# Patient Record
Sex: Female | Born: 1945 | Race: White | Hispanic: No | Marital: Married | Smoking: Never smoker
Health system: Southern US, Community
[De-identification: ages and names within clinical notes are randomized; demographics above are authoritative.]

## PROBLEM LIST (undated history)

## (undated) DIAGNOSIS — K5909 Other constipation: Secondary | ICD-10-CM

## (undated) DIAGNOSIS — N6099 Unspecified benign mammary dysplasia of unspecified breast: Secondary | ICD-10-CM

## (undated) DIAGNOSIS — I639 Cerebral infarction, unspecified: Secondary | ICD-10-CM

## (undated) DIAGNOSIS — K911 Postgastric surgery syndromes: Secondary | ICD-10-CM

## (undated) DIAGNOSIS — I509 Heart failure, unspecified: Secondary | ICD-10-CM

## (undated) DIAGNOSIS — K635 Polyp of colon: Secondary | ICD-10-CM

## (undated) DIAGNOSIS — R0600 Dyspnea, unspecified: Secondary | ICD-10-CM

## (undated) DIAGNOSIS — I499 Cardiac arrhythmia, unspecified: Secondary | ICD-10-CM

## (undated) DIAGNOSIS — C50919 Malignant neoplasm of unspecified site of unspecified female breast: Secondary | ICD-10-CM

## (undated) DIAGNOSIS — I6381 Other cerebral infarction due to occlusion or stenosis of small artery: Secondary | ICD-10-CM

## (undated) DIAGNOSIS — Z923 Personal history of irradiation: Secondary | ICD-10-CM

## (undated) DIAGNOSIS — E785 Hyperlipidemia, unspecified: Secondary | ICD-10-CM

## (undated) DIAGNOSIS — R928 Other abnormal and inconclusive findings on diagnostic imaging of breast: Secondary | ICD-10-CM

## (undated) DIAGNOSIS — K5900 Constipation, unspecified: Secondary | ICD-10-CM

## (undated) DIAGNOSIS — R197 Diarrhea, unspecified: Secondary | ICD-10-CM

## (undated) DIAGNOSIS — M270 Developmental disorders of jaws: Secondary | ICD-10-CM

## (undated) DIAGNOSIS — U071 COVID-19: Secondary | ICD-10-CM

## (undated) DIAGNOSIS — E669 Obesity, unspecified: Secondary | ICD-10-CM

## (undated) DIAGNOSIS — I10 Essential (primary) hypertension: Secondary | ICD-10-CM

## (undated) DIAGNOSIS — M1990 Unspecified osteoarthritis, unspecified site: Secondary | ICD-10-CM

## (undated) DIAGNOSIS — K589 Irritable bowel syndrome without diarrhea: Secondary | ICD-10-CM

## (undated) HISTORY — PX: CHOLECYSTECTOMY: SHX55

## (undated) HISTORY — PX: OTHER SURGICAL HISTORY: SHX169

## (undated) HISTORY — PX: BREAST LUMPECTOMY: SHX2

## (undated) HISTORY — PX: COLONOSCOPY: SHX174

## (undated) HISTORY — DX: Heart failure, unspecified: I50.9

## (undated) HISTORY — DX: Cerebral infarction, unspecified: I63.9

## (undated) SURGERY — Surgical Case
Anesthesia: *Unknown

---

## 1998-10-09 DIAGNOSIS — C50919 Malignant neoplasm of unspecified site of unspecified female breast: Secondary | ICD-10-CM

## 1998-10-09 HISTORY — PX: BREAST LUMPECTOMY: SHX2

## 1998-10-09 HISTORY — PX: BREAST BIOPSY: SHX20

## 1998-10-09 HISTORY — DX: Malignant neoplasm of unspecified site of unspecified female breast: C50.919

## 2004-06-10 ENCOUNTER — Other Ambulatory Visit: Payer: Self-pay

## 2005-01-19 ENCOUNTER — Ambulatory Visit: Payer: Self-pay | Admitting: Oncology

## 2005-05-08 ENCOUNTER — Ambulatory Visit: Payer: Self-pay | Admitting: Unknown Physician Specialty

## 2005-06-20 ENCOUNTER — Ambulatory Visit: Payer: Self-pay | Admitting: Gastroenterology

## 2006-01-18 ENCOUNTER — Ambulatory Visit: Payer: Self-pay | Admitting: Oncology

## 2006-05-14 ENCOUNTER — Ambulatory Visit: Payer: Self-pay | Admitting: Unknown Physician Specialty

## 2007-01-02 ENCOUNTER — Ambulatory Visit: Payer: Self-pay | Admitting: Unknown Physician Specialty

## 2007-05-23 ENCOUNTER — Ambulatory Visit: Payer: Self-pay | Admitting: Unknown Physician Specialty

## 2008-07-09 ENCOUNTER — Ambulatory Visit: Payer: Self-pay | Admitting: Unknown Physician Specialty

## 2009-07-29 ENCOUNTER — Ambulatory Visit: Payer: Self-pay | Admitting: Unknown Physician Specialty

## 2010-09-07 ENCOUNTER — Ambulatory Visit: Payer: Self-pay | Admitting: Unknown Physician Specialty

## 2011-10-25 ENCOUNTER — Ambulatory Visit: Payer: Self-pay | Admitting: Unknown Physician Specialty

## 2012-11-14 ENCOUNTER — Ambulatory Visit: Payer: Self-pay | Admitting: Family Medicine

## 2013-11-18 ENCOUNTER — Ambulatory Visit: Payer: Self-pay | Admitting: Family Medicine

## 2014-07-14 DIAGNOSIS — M170 Bilateral primary osteoarthritis of knee: Secondary | ICD-10-CM | POA: Insufficient documentation

## 2014-09-10 DIAGNOSIS — M5431 Sciatica, right side: Secondary | ICD-10-CM | POA: Insufficient documentation

## 2014-09-10 DIAGNOSIS — M4316 Spondylolisthesis, lumbar region: Secondary | ICD-10-CM | POA: Insufficient documentation

## 2014-11-19 ENCOUNTER — Ambulatory Visit: Payer: Self-pay | Admitting: Family Medicine

## 2016-02-21 ENCOUNTER — Other Ambulatory Visit: Payer: Self-pay | Admitting: Family Medicine

## 2016-02-21 DIAGNOSIS — Z1231 Encounter for screening mammogram for malignant neoplasm of breast: Secondary | ICD-10-CM

## 2016-03-02 ENCOUNTER — Other Ambulatory Visit: Payer: Self-pay | Admitting: Family Medicine

## 2016-03-02 ENCOUNTER — Ambulatory Visit
Admission: RE | Admit: 2016-03-02 | Discharge: 2016-03-02 | Disposition: A | Discharge status: Home / Self Care | Payer: Medicare Other | Source: Ambulatory Visit | Attending: Family Medicine | Admitting: Family Medicine

## 2016-03-02 DIAGNOSIS — Z1231 Encounter for screening mammogram for malignant neoplasm of breast: Secondary | ICD-10-CM

## 2016-03-02 HISTORY — DX: Malignant neoplasm of unspecified site of unspecified female breast: C50.919

## 2016-07-03 ENCOUNTER — Encounter: Payer: Self-pay | Admitting: *Deleted

## 2016-07-04 ENCOUNTER — Encounter: Payer: Self-pay | Admitting: *Deleted

## 2016-07-04 ENCOUNTER — Encounter
Admission: RE | Disposition: A | Discharge status: Home / Self Care | Payer: Self-pay | Source: Ambulatory Visit | Attending: Gastroenterology

## 2016-07-04 ENCOUNTER — Ambulatory Visit: Payer: Medicare Other | Admitting: Certified Registered Nurse Anesthetist

## 2016-07-04 ENCOUNTER — Ambulatory Visit
Admission: RE | Admit: 2016-07-04 | Discharge: 2016-07-04 | Disposition: A | Discharge status: Home / Self Care | Payer: Medicare Other | Source: Ambulatory Visit | Attending: Gastroenterology | Admitting: Gastroenterology

## 2016-07-04 DIAGNOSIS — K5730 Diverticulosis of large intestine without perforation or abscess without bleeding: Secondary | ICD-10-CM | POA: Insufficient documentation

## 2016-07-04 DIAGNOSIS — K641 Second degree hemorrhoids: Secondary | ICD-10-CM | POA: Insufficient documentation

## 2016-07-04 DIAGNOSIS — E669 Obesity, unspecified: Secondary | ICD-10-CM | POA: Insufficient documentation

## 2016-07-04 DIAGNOSIS — Z1211 Encounter for screening for malignant neoplasm of colon: Secondary | ICD-10-CM | POA: Insufficient documentation

## 2016-07-04 DIAGNOSIS — Z6836 Body mass index (BMI) 36.0-36.9, adult: Secondary | ICD-10-CM | POA: Insufficient documentation

## 2016-07-04 DIAGNOSIS — Z79899 Other long term (current) drug therapy: Secondary | ICD-10-CM | POA: Insufficient documentation

## 2016-07-04 DIAGNOSIS — Z800 Family history of malignant neoplasm of digestive organs: Secondary | ICD-10-CM | POA: Diagnosis not present

## 2016-07-04 DIAGNOSIS — Z791 Long term (current) use of non-steroidal anti-inflammatories (NSAID): Secondary | ICD-10-CM | POA: Diagnosis not present

## 2016-07-04 DIAGNOSIS — Z86010 Personal history of colonic polyps: Secondary | ICD-10-CM | POA: Insufficient documentation

## 2016-07-04 DIAGNOSIS — I10 Essential (primary) hypertension: Secondary | ICD-10-CM | POA: Diagnosis not present

## 2016-07-04 DIAGNOSIS — Z8673 Personal history of transient ischemic attack (TIA), and cerebral infarction without residual deficits: Secondary | ICD-10-CM | POA: Insufficient documentation

## 2016-07-04 DIAGNOSIS — Z853 Personal history of malignant neoplasm of breast: Secondary | ICD-10-CM | POA: Insufficient documentation

## 2016-07-04 DIAGNOSIS — Z7982 Long term (current) use of aspirin: Secondary | ICD-10-CM | POA: Insufficient documentation

## 2016-07-04 DIAGNOSIS — E785 Hyperlipidemia, unspecified: Secondary | ICD-10-CM | POA: Diagnosis not present

## 2016-07-04 HISTORY — DX: Postgastric surgery syndromes: K91.1

## 2016-07-04 HISTORY — DX: Other abnormal and inconclusive findings on diagnostic imaging of breast: R92.8

## 2016-07-04 HISTORY — DX: Constipation, unspecified: K59.00

## 2016-07-04 HISTORY — DX: Hyperlipidemia, unspecified: E78.5

## 2016-07-04 HISTORY — DX: Unspecified benign mammary dysplasia of unspecified breast: N60.99

## 2016-07-04 HISTORY — DX: Other constipation: K59.09

## 2016-07-04 HISTORY — DX: Cardiac arrhythmia, unspecified: I49.9

## 2016-07-04 HISTORY — DX: Other cerebral infarction due to occlusion or stenosis of small artery: I63.81

## 2016-07-04 HISTORY — DX: Diarrhea, unspecified: R19.7

## 2016-07-04 HISTORY — DX: Essential (primary) hypertension: I10

## 2016-07-04 HISTORY — DX: Developmental disorders of jaws: M27.0

## 2016-07-04 HISTORY — DX: Polyp of colon: K63.5

## 2016-07-04 HISTORY — PX: COLONOSCOPY WITH PROPOFOL: SHX5780

## 2016-07-04 HISTORY — DX: Obesity, unspecified: E66.9

## 2016-07-04 HISTORY — DX: Unspecified osteoarthritis, unspecified site: M19.90

## 2016-07-04 HISTORY — DX: Irritable bowel syndrome, unspecified: K58.9

## 2016-07-04 SURGERY — COLONOSCOPY WITH PROPOFOL
Anesthesia: General

## 2016-07-04 MED ORDER — SODIUM CHLORIDE 0.9 % IV SOLN: INTRAVENOUS | Status: DC

## 2016-07-04 MED ORDER — LIDOCAINE HCL (CARDIAC) 20 MG/ML IV SOLN
INTRAVENOUS | Status: DC | PRN
  Administered 2016-07-04: 30 mg via INTRAVENOUS

## 2016-07-04 MED ORDER — PROPOFOL 10 MG/ML IV BOLUS
INTRAVENOUS | Status: DC | PRN
Start: 2016-07-04 — End: 2016-07-04
  Administered 2016-07-04: 30 mg via INTRAVENOUS

## 2016-07-04 MED ORDER — SODIUM CHLORIDE 0.9 % IV SOLN
INTRAVENOUS | Status: DC
  Administered 2016-07-04: 1000 mL via INTRAVENOUS

## 2016-07-04 MED ORDER — PROPOFOL 500 MG/50ML IV EMUL
INTRAVENOUS | Status: DC | PRN
  Administered 2016-07-04: 120 ug/kg/min via INTRAVENOUS

## 2016-07-04 MED ORDER — MIDAZOLAM HCL 2 MG/2ML IJ SOLN
INTRAMUSCULAR | Status: DC | PRN
  Administered 2016-07-04: 1 mg via INTRAVENOUS

## 2016-07-04 NOTE — Anesthesia Preprocedure Evaluation (Addendum)
Anesthesia Evaluation  Patient identified by MRN, date of birth, ID band Patient awake    Reviewed: Allergy & Precautions, NPO status , Patient's Chart, lab work & pertinent test results, reviewed documented beta blocker date and time   Airway Mallampati: III  TM Distance: <3 FB     Dental   Pulmonary neg pulmonary ROS,    Pulmonary exam normal        Cardiovascular hypertension, Pt. on medications and Pt. on home beta blockers Normal cardiovascular exam+ dysrhythmias      Neuro/Psych L Lacunar infarct CVA, No Residual Symptoms negative psych ROS   GI/Hepatic Neg liver ROS, IBS and colon polyps   Endo/Other  negative endocrine ROS  Renal/GU negative Renal ROS  negative genitourinary   Musculoskeletal  (+) Arthritis , Osteoarthritis,    Abdominal Normal abdominal exam  (+)   Peds negative pediatric ROS (+)  Hematology negative hematology ROS (+)   Anesthesia Other Findings Hx of Left breast CA  Reproductive/Obstetrics                            Anesthesia Physical Anesthesia Plan  ASA: III  Anesthesia Plan: General   Post-op Pain Management:    Induction: Intravenous  Airway Management Planned: Nasal Cannula  Additional Equipment:   Intra-op Plan:   Post-operative Plan:   Informed Consent: I have reviewed the patients History and Physical, chart, labs and discussed the procedure including the risks, benefits and alternatives for the proposed anesthesia with the patient or authorized representative who has indicated his/her understanding and acceptance.   Dental advisory given  Plan Discussed with: CRNA and Surgeon  Anesthesia Plan Comments:         Anesthesia Quick Evaluation

## 2016-07-04 NOTE — Transfer of Care (Signed)
Immediate Anesthesia Transfer of Care Note  Patient: Kelly Howell  Procedure(s) Performed: Procedure(s): COLONOSCOPY WITH PROPOFOL (N/A)  Patient Location: PACU  Anesthesia Type:General  Level of Consciousness: sedated  Airway & Oxygen Therapy: Patient Spontanous Breathing and Patient connected to nasal cannula oxygen  Post-op Assessment: Report given to RN and Post -op Vital signs reviewed and stable  Post vital signs: Reviewed and stable  Last Vitals:  Vitals:   07/04/16 1010 07/04/16 1154  BP: (!) 151/75   Pulse: 68   Resp: 16   Temp: 37.1 C (!) 35.9 C    Last Pain:  Vitals:   07/04/16 1154  TempSrc: Tympanic         Complications: No apparent anesthesia complications

## 2016-07-04 NOTE — Op Note (Signed)
[facility] Patient Name: Kelly Howell Procedure Date: 07/04/2016 11:11 AM MRN: [id number] Account #: [id number] Date of Birth: April 17, 1946 Admit Type: Outpatient Age: 70 Room: [facility] ENDO ROOM 3 Gender: Female Note Status: Finalized Procedure:            Colonoscopy Indications:          Family history of colon cancer in a first-degree                        relative Providers:            Lollie Sails, MD Referring MD:         Irven Easterly. Kary Kos, MD (Referring MD) Medicines:            Monitored Anesthesia Care Complications:        No immediate complications. minimal mucosal irritation                        in the proximal ascending, limited to the mucosa Procedure:            Pre-Anesthesia Assessment:                       - ASA Grade Assessment: III - A patient with severe                        systemic disease.                       After obtaining informed consent, the colonoscope was                        passed under direct vision. Throughout the procedure,                        the patient's blood pressure, pulse, and oxygen                        saturations were monitored continuously. The                        Colonoscope was introduced through the anus and                        advanced to the the cecum, identified by appendiceal                        orifice and ileocecal valve. The colonoscopy was                        performed with moderate difficulty due to significant                        looping. Successful completion of the procedure was                        aided by changing the patient to a supine position,                        changing the patient to a prone position and using  manual pressure. The quality of the bowel preparation                        was good. Findings:      Multiple small and large-mouthed diverticula were found in the sigmoid       colon and descending colon.  Non-bleeding internal hemorrhoids were found during retroflexion, during       digital exam and during anoscopy. The hemorrhoids were small and Grade       II (internal hemorrhoids that prolapse but reduce spontaneously).      The exam was otherwise without abnormality.      The digital rectal exam was normal otherwise. Impression:           - Diverticulosis in the sigmoid colon and in the                        descending colon.                       - Non-bleeding internal hemorrhoids.                       - The examination was otherwise normal.                       - No specimens collected. Recommendation:       - Repeat colonoscopy in 5 years for screening purposes. Procedure Code(s):    --- Professional ---                       [phone], Colonoscopy, flexible; diagnostic, including                        collection of specimen(s) by brushing or washing, when                        performed (separate procedure) CPT copyright 2016 [employer]. All rights reserved. The codes documented in this report are preliminary and upon coder review may  be revised to meet current compliance requirements. Lollie Sails, MD 07/04/2016 11:55:48 AM This report has been signed electronically. Number of Addenda: 0 Note Initiated On: 07/04/2016 11:11 AM Scope Withdrawal Time: 0 hours 8 minutes 20 seconds  Total Procedure Duration: 0 hours 27 minutes 17 seconds       [facility]

## 2016-07-04 NOTE — H&P (Signed)
Outpatient short stay form Pre-procedure 07/04/2016 11:16 AM Lollie Sails MD  Primary Physician: Dr. Maryland Pink  Reason for visit:  Colonoscopy  History of present illness:  Patient is a 70 year old female presenting today as above. She tolerated her prep well. She does take a daily 81 mg aspirin but held that this morning. She takes no other aspirin products or blood thinning agents.    Current Facility-Administered Medications:  .  0.9 %  sodium chloride infusion, , Intravenous, Continuous, Lollie Sails, MD, Last Rate: 20 mL/hr at 07/04/16 1029, 1,000 mL at 07/04/16 1029 .  0.9 %  sodium chloride infusion, , Intravenous, Continuous, Lollie Sails, MD  Prescriptions Prior to Admission  Medication Sig Dispense Refill Last Dose  . aspirin 81 MG chewable tablet Chew by mouth daily.     Marland Kitchen atorvastatin (LIPITOR) 20 MG tablet Take 20 mg by mouth daily.     Marland Kitchen co-enzyme Q-10 30 MG capsule Take 100 mg by mouth daily.     . fexofenadine (ALLEGRA) 180 MG tablet Take 180 mg by mouth daily as needed for allergies or rhinitis.     . fluticasone (FLONASE) 50 MCG/ACT nasal spray Place 2 sprays into both nostrils daily.     . Hylan (SYNVISC) 16 MG/2ML SOSY Inject into the articular space once a week.     Marland Kitchen lisinopril (PRINIVIL,ZESTRIL) 20 MG tablet Take 20 mg by mouth daily.   07/04/2016 at 0620  . LORazepam (ATIVAN) 0.5 MG tablet Take 0.5 mg by mouth 2 (two) times daily as needed for anxiety.     . metoprolol tartrate (LOPRESSOR) 25 MG tablet Take 25 mg by mouth 2 (two) times daily.   07/04/2016 at 0620  . naproxen (NAPROSYN) 500 MG tablet Take 500 mg by mouth 2 (two) times daily with a meal.     . triamcinolone ointment (KENALOG) 0.1 % Apply 1 application topically 2 (two) times daily as needed.     Marland Kitchen VITAMIN B1-B12 IM Inject 1,000 mcg into the muscle.        Allergies  Allergen Reactions  . Gantrisin [Sulfisoxazole]   . Sulfa Antibiotics      Past Medical History:  Diagnosis  Date  . Abnormal mammogram   . Atypical hyperplasia of breast    Left  . Breast cancer ([place]) 2000   left lumpectomy  . Colon polyps   . Dumping syndrome   . Dysrhythmia    pvc  . Hyperlipidemia   . Hypertension   . IBS (irritable bowel syndrome)   . Intermittent constipation   . Intermittent diarrhea   . Left sided lacunar infarction ([place])   . OA (osteoarthritis)   . Obesity   . Torus palatinus     Review of systems:      Physical Exam    Heart and lungs: Regular rate and rhythm without rub or gallop, lungs are bilaterally clear.    HEENT: Normocephalic atraumatic eyes are anicteric    Other:     Pertinant exam for procedure: Soft nontender nondistended bowel sounds positive normoactive.    Planned proceedures: Colonoscopy and indicated procedures. I have discussed the risks benefits and complications of procedures to include not limited to bleeding, infection, perforation and the risk of sedation and the patient wishes to proceed.    Lollie Sails, MD Gastroenterology 07/04/2016  11:16 AM

## 2016-07-04 NOTE — Anesthesia Postprocedure Evaluation (Signed)
Anesthesia Post Note  Patient: Kelly Howell  Procedure(s) Performed: Procedure(s) (LRB): COLONOSCOPY WITH PROPOFOL (N/A)  Patient location during evaluation: Endoscopy Anesthesia Type: General Level of consciousness: awake and alert and oriented Pain management: pain level controlled Vital Signs Assessment: post-procedure vital signs reviewed and stable Respiratory status: spontaneous breathing, nonlabored ventilation and respiratory function stable Cardiovascular status: blood pressure returned to baseline and stable Postop Assessment: no signs of nausea or vomiting Anesthetic complications: no    Last Vitals:  Vitals:   07/04/16 1203 07/04/16 1213  BP: 99/60 122/78  Pulse: (!) 54 61  Resp: 16 14  Temp:      Last Pain:  Vitals:   07/04/16 1157  TempSrc: Tympanic                 Robyne Matar

## 2016-07-05 ENCOUNTER — Encounter: Payer: Self-pay | Admitting: Gastroenterology

## 2017-01-19 ENCOUNTER — Other Ambulatory Visit: Payer: Self-pay | Admitting: Family Medicine

## 2017-01-19 DIAGNOSIS — Z1231 Encounter for screening mammogram for malignant neoplasm of breast: Secondary | ICD-10-CM

## 2017-03-06 ENCOUNTER — Ambulatory Visit
Admission: RE | Admit: 2017-03-06 | Discharge: 2017-03-06 | Disposition: A | Discharge status: Home / Self Care | Payer: Medicare Other | Source: Ambulatory Visit | Attending: Family Medicine | Admitting: Family Medicine

## 2017-03-06 DIAGNOSIS — Z1231 Encounter for screening mammogram for malignant neoplasm of breast: Secondary | ICD-10-CM

## 2018-01-31 ENCOUNTER — Other Ambulatory Visit: Payer: Self-pay | Admitting: Family Medicine

## 2018-01-31 DIAGNOSIS — Z1231 Encounter for screening mammogram for malignant neoplasm of breast: Secondary | ICD-10-CM

## 2018-03-07 ENCOUNTER — Ambulatory Visit
Admission: RE | Admit: 2018-03-07 | Discharge: 2018-03-07 | Disposition: A | Discharge status: Home / Self Care | Payer: Medicare Other | Source: Ambulatory Visit | Attending: Family Medicine | Admitting: Family Medicine

## 2018-03-07 DIAGNOSIS — Z1231 Encounter for screening mammogram for malignant neoplasm of breast: Secondary | ICD-10-CM | POA: Diagnosis not present

## 2019-01-29 ENCOUNTER — Other Ambulatory Visit: Payer: Self-pay | Admitting: Family Medicine

## 2019-01-29 DIAGNOSIS — Z1231 Encounter for screening mammogram for malignant neoplasm of breast: Secondary | ICD-10-CM

## 2019-03-20 ENCOUNTER — Other Ambulatory Visit: Payer: Self-pay

## 2019-03-20 ENCOUNTER — Ambulatory Visit
Admission: RE | Admit: 2019-03-20 | Discharge: 2019-03-20 | Disposition: A | Discharge status: Home / Self Care | Payer: Medicare Other | Source: Ambulatory Visit | Attending: Family Medicine | Admitting: Family Medicine

## 2019-03-20 DIAGNOSIS — Z1231 Encounter for screening mammogram for malignant neoplasm of breast: Secondary | ICD-10-CM | POA: Insufficient documentation

## 2019-11-25 DIAGNOSIS — Z6841 Body Mass Index (BMI) 40.0 and over, adult: Secondary | ICD-10-CM | POA: Insufficient documentation

## 2020-02-10 ENCOUNTER — Other Ambulatory Visit: Payer: Self-pay | Admitting: Family Medicine

## 2020-02-10 DIAGNOSIS — Z1231 Encounter for screening mammogram for malignant neoplasm of breast: Secondary | ICD-10-CM

## 2020-03-22 ENCOUNTER — Ambulatory Visit
Admission: RE | Admit: 2020-03-22 | Discharge: 2020-03-22 | Disposition: A | Discharge status: Home / Self Care | Payer: Medicare Other | Source: Ambulatory Visit | Attending: Family Medicine | Admitting: Family Medicine

## 2020-03-22 DIAGNOSIS — Z1231 Encounter for screening mammogram for malignant neoplasm of breast: Secondary | ICD-10-CM | POA: Diagnosis not present

## 2020-03-24 ENCOUNTER — Other Ambulatory Visit: Payer: Self-pay | Admitting: Family Medicine

## 2020-03-24 DIAGNOSIS — R928 Other abnormal and inconclusive findings on diagnostic imaging of breast: Secondary | ICD-10-CM

## 2020-03-24 DIAGNOSIS — R921 Mammographic calcification found on diagnostic imaging of breast: Secondary | ICD-10-CM

## 2020-03-30 ENCOUNTER — Ambulatory Visit
Admission: RE | Admit: 2020-03-30 | Discharge: 2020-03-30 | Disposition: A | Discharge status: Home / Self Care | Payer: Medicare Other | Source: Ambulatory Visit | Attending: Family Medicine | Admitting: Family Medicine

## 2020-03-30 DIAGNOSIS — R921 Mammographic calcification found on diagnostic imaging of breast: Secondary | ICD-10-CM

## 2020-03-30 DIAGNOSIS — R928 Other abnormal and inconclusive findings on diagnostic imaging of breast: Secondary | ICD-10-CM | POA: Diagnosis not present

## 2020-04-02 ENCOUNTER — Other Ambulatory Visit: Payer: Self-pay | Admitting: Family Medicine

## 2020-04-02 DIAGNOSIS — R921 Mammographic calcification found on diagnostic imaging of breast: Secondary | ICD-10-CM

## 2020-04-02 DIAGNOSIS — R928 Other abnormal and inconclusive findings on diagnostic imaging of breast: Secondary | ICD-10-CM

## 2020-09-30 ENCOUNTER — Ambulatory Visit
Admission: RE | Admit: 2020-09-30 | Discharge: 2020-09-30 | Disposition: A | Discharge status: Home / Self Care | Payer: Medicare Other | Source: Ambulatory Visit | Attending: Family Medicine | Admitting: Family Medicine

## 2020-09-30 ENCOUNTER — Other Ambulatory Visit: Payer: Self-pay

## 2020-09-30 DIAGNOSIS — R928 Other abnormal and inconclusive findings on diagnostic imaging of breast: Secondary | ICD-10-CM

## 2020-09-30 DIAGNOSIS — R921 Mammographic calcification found on diagnostic imaging of breast: Secondary | ICD-10-CM | POA: Insufficient documentation

## 2020-10-06 ENCOUNTER — Other Ambulatory Visit: Payer: Self-pay | Admitting: Family Medicine

## 2020-10-06 ENCOUNTER — Other Ambulatory Visit: Payer: Self-pay | Admitting: *Deleted

## 2020-10-06 DIAGNOSIS — R928 Other abnormal and inconclusive findings on diagnostic imaging of breast: Secondary | ICD-10-CM

## 2020-10-06 DIAGNOSIS — R921 Mammographic calcification found on diagnostic imaging of breast: Secondary | ICD-10-CM

## 2020-10-26 ENCOUNTER — Ambulatory Visit: Payer: Medicare Other

## 2020-11-03 ENCOUNTER — Ambulatory Visit
Admission: RE | Admit: 2020-11-03 | Discharge: 2020-11-03 | Disposition: A | Discharge status: Home / Self Care | Payer: Medicare Other | Source: Ambulatory Visit | Attending: Family Medicine | Admitting: Family Medicine

## 2020-11-03 ENCOUNTER — Other Ambulatory Visit: Payer: Self-pay

## 2020-11-03 DIAGNOSIS — R921 Mammographic calcification found on diagnostic imaging of breast: Secondary | ICD-10-CM | POA: Insufficient documentation

## 2020-11-03 DIAGNOSIS — R928 Other abnormal and inconclusive findings on diagnostic imaging of breast: Secondary | ICD-10-CM | POA: Diagnosis present

## 2020-11-03 DIAGNOSIS — D0512 Intraductal carcinoma in situ of left breast: Secondary | ICD-10-CM | POA: Diagnosis not present

## 2020-11-03 HISTORY — PX: BREAST BIOPSY: SHX20

## 2020-11-05 DIAGNOSIS — D0512 Intraductal carcinoma in situ of left breast: Secondary | ICD-10-CM

## 2020-11-05 LAB — SURGICAL PATHOLOGY

## 2020-11-06 NOTE — Progress Notes (Signed)
Navigation initiated.  Consults scheduled with Dr, Windell Moment and Dr. Rogue Bussing per patient request.

## 2020-11-09 ENCOUNTER — Inpatient Hospital Stay: Payer: Medicare Other

## 2020-11-09 ENCOUNTER — Other Ambulatory Visit: Payer: Self-pay

## 2020-11-09 ENCOUNTER — Other Ambulatory Visit: Payer: Self-pay | Admitting: *Deleted

## 2020-11-09 ENCOUNTER — Encounter: Payer: Self-pay | Admitting: Internal Medicine

## 2020-11-09 ENCOUNTER — Encounter (INDEPENDENT_AMBULATORY_CARE_PROVIDER_SITE_OTHER): Payer: Self-pay

## 2020-11-09 ENCOUNTER — Inpatient Hospital Stay: Payer: Medicare Other | Attending: Internal Medicine | Admitting: Internal Medicine

## 2020-11-09 DIAGNOSIS — I10 Essential (primary) hypertension: Secondary | ICD-10-CM | POA: Insufficient documentation

## 2020-11-09 DIAGNOSIS — Z791 Long term (current) use of non-steroidal anti-inflammatories (NSAID): Secondary | ICD-10-CM | POA: Insufficient documentation

## 2020-11-09 DIAGNOSIS — K589 Irritable bowel syndrome without diarrhea: Secondary | ICD-10-CM | POA: Insufficient documentation

## 2020-11-09 DIAGNOSIS — Z8673 Personal history of transient ischemic attack (TIA), and cerebral infarction without residual deficits: Secondary | ICD-10-CM | POA: Insufficient documentation

## 2020-11-09 DIAGNOSIS — Z7982 Long term (current) use of aspirin: Secondary | ICD-10-CM | POA: Diagnosis not present

## 2020-11-09 DIAGNOSIS — E669 Obesity, unspecified: Secondary | ICD-10-CM | POA: Insufficient documentation

## 2020-11-09 DIAGNOSIS — E785 Hyperlipidemia, unspecified: Secondary | ICD-10-CM | POA: Diagnosis not present

## 2020-11-09 DIAGNOSIS — D0512 Intraductal carcinoma in situ of left breast: Secondary | ICD-10-CM | POA: Diagnosis present

## 2020-11-09 DIAGNOSIS — Z79899 Other long term (current) drug therapy: Secondary | ICD-10-CM | POA: Diagnosis not present

## 2020-11-09 DIAGNOSIS — Z170 Estrogen receptor positive status [ER+]: Secondary | ICD-10-CM | POA: Insufficient documentation

## 2020-11-09 DIAGNOSIS — M1990 Unspecified osteoarthritis, unspecified site: Secondary | ICD-10-CM | POA: Insufficient documentation

## 2020-11-09 NOTE — Assessment & Plan Note (Addendum)
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  Biopsy #1 microinvasive disease cannot be excluded.  ER positive.   #I had a long discussion with patient regarding pathology/natural history of DCIS.  Also had a long discussion regarding the treatment options which include lumpectomy.  Question need for sentinel lymph node biopsy based on comedonecrosis.  Will defer to surgery.  # Also discussed regarding radiation therapy post lumpectomy.  In general radiation the cuts down the risk of recurrence by 50%. also discussed option of antihormone therapy-like anastrozole/Aromasin to cut down the risk of development development of ipsilateral/contralateral invasive/noninvasive breast cancer.  Also discussed a small chance of discovering invasive cancer post surgery-which may or may not change adjuvant treatment strategy.  # Also discussed at length- the option of phase 3 clinical trial COMET which randomizes patient to standard treatment versus experimental [antihormone therapy only].  However, given microinvasive disease I doubt if patient would be candidate for the trial.  Will discuss at tumor conference.  Discussed with clinical trials nurse.  #Left breast bruising post biopsy; recommend holding aspirin for 1 more week.  Recommend ice packing.   # Thank you for allowing me to participate in the care of your pleasant patient. Please do not hesitate to contact me with questions or concerns in the interim.  # DISPOSITION: # follow up TBD-Dr.B

## 2020-11-09 NOTE — Progress Notes (Signed)
.[facility] [place] NOTE  Patient Care Team: [place] Pink, MD as PCP - General (Family Medicine) Theodore Demark, RN as Oncology Nurse Navigator  CHIEF COMPLAINTS/PURPOSE OF CONSULTATION: DCIS  #  Oncology History Overview Note  # 2005- left breast lumpectomy [Dr.Craford; Dr.Chokis; II opinion [facility]- hyperplasia] Tam x5 years   # 2022-LEFT BREAST DCIS x2; #A] Bx-cannot rule out microinvasive disease; ER positive; Dr.Cintron   # SURVIVORSHIP:   # GENETICS:   DIAGNOSIS:   STAGE:         ;  GOALS:  CURRENT/MOST RECENT THERAPY :     Ductal carcinoma in situ (DCIS) of left breast  11/09/2020 Initial Diagnosis   Ductal carcinoma in situ (DCIS) of left breast      HISTORY OF PRESENTING ILLNESS:  Kelly Howell 75 y.o.  female has been referred to [place] for further evaluation recommendations for new diagnosis of  DCIS.   Patient states she was found to have an abnormal screening mammogram in dec 2021 which led to diagnostic mammogram/ultrasound/followed by biopsy-as summarized above.  Of note patient had wide local resection/lumpectomy in 2005.  As per patient at the time pathology even reviewed at [facility] were not conclusive for hyperplasia versus early malignancy.  Patient finished tamoxifen for 5 years.  Family history of breast cancer: mat granda ma- breast cancer; mother-colon cancer; mat aunt- breast cancer; aunts daughters- breast cancer  Used estrogen and progesterone therapy: none History of Radiation to the chest:  Previous biopsy:see above    Review of Systems  Constitutional: Negative for chills, diaphoresis, fever, malaise/fatigue and weight loss.  HENT: Negative for nosebleeds and sore throat.   Eyes: Negative for double vision.  Respiratory: Negative for cough, hemoptysis, sputum production, shortness of breath and wheezing.   Cardiovascular: Negative for chest pain, palpitations, orthopnea and leg swelling.  Gastrointestinal: Negative for  abdominal pain, blood in stool, constipation, diarrhea, heartburn, melena, nausea and vomiting.  Genitourinary: Negative for dysuria, frequency and urgency.  Musculoskeletal: Positive for joint pain. Negative for back pain.  Skin: Negative.  Negative for itching and rash.  Neurological: Negative for dizziness, tingling, focal weakness, weakness and headaches.  Endo/Heme/Allergies: Does not bruise/bleed easily.  Psychiatric/Behavioral: Negative for depression. The patient is not nervous/anxious and does not have insomnia.      MEDICAL HISTORY:  Past Medical History:  Diagnosis Date  . Abnormal mammogram   . Atypical hyperplasia of breast    Left  . Breast cancer ([place]) 2000   left lumpectomy  . Colon polyps   . Dumping syndrome   . Dysrhythmia    pvc  . Hyperlipidemia   . Hypertension   . IBS (irritable bowel syndrome)   . Intermittent constipation   . Intermittent diarrhea   . Left sided lacunar infarction ([place])   . OA (osteoarthritis)   . Obesity   . Torus palatinus     SURGICAL HISTORY: Past Surgical History:  Procedure Laterality Date  . BREAST BIOPSY Left 2000   Positive  . BREAST BIOPSY Left 11/03/2020   affirm bx ,  posterior group coil marker, path pending  . BREAST BIOPSY Left 11/03/2020   affirm bx, anterior group x marker, path pending  . BREAST LUMPECTOMY Left 2000   positive for breast ca  . CESAREAN SECTION    . CHOLECYSTECTOMY    . COLONOSCOPY    . COLONOSCOPY WITH PROPOFOL N/A 07/04/2016   Procedure: COLONOSCOPY WITH PROPOFOL;  Surgeon: Lollie Sails, MD;  Location: [facility] ENDOSCOPY;  Service: Endoscopy;  Laterality: N/A;  . Left Wrist Biopsy      SOCIAL HISTORY: Social History   Socioeconomic History  . Marital status: Married    Spouse name: Not on file  . Number of children: Not on file  . Years of education: Not on file  . Highest education level: Not on file  Occupational History  . Not on file  Tobacco Use  . Smoking status: Never  Smoker  . Smokeless tobacco: Never Used  Substance and Sexual Activity  . Alcohol use: Not on file  . Drug use: Not on file  . Sexual activity: Not on file  Other Topics Concern  . Not on file  Social History Narrative   Lives in [place]. 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    Social Determinants of Health   Financial Resource Strain: Not on file  Food Insecurity: Not on file  Transportation Needs: Not on file  Physical Activity: Not on file  Stress: Not on file  Social Connections: Not on file  Intimate Partner Violence: Not on file    FAMILY HISTORY: Family History  Problem Relation Age of Onset  . Colon cancer Mother   . Breast cancer Maternal Aunt 65  . Breast cancer Paternal Grandmother 110    ALLERGIES:  is allergic to gantrisin [sulfisoxazole] and sulfa antibiotics.  MEDICATIONS:  Current Outpatient Medications  Medication Sig Dispense Refill  . aspirin 81 MG chewable tablet Chew by mouth daily.    Marland Kitchen atorvastatin (LIPITOR) 20 MG tablet Take 20 mg by mouth daily.    . Coenzyme Q10 100 MG capsule Take 100 mg by mouth daily.    . fexofenadine (ALLEGRA) 180 MG tablet Take 180 mg by mouth daily as needed for allergies or rhinitis.    . fluticasone (FLONASE) 50 MCG/ACT nasal spray Place 2 sprays into both nostrils daily.    . Hylan 16 MG/2ML SOSY Inject into the articular space every 6 (six) months.    Marland Kitchen lisinopril (PRINIVIL,ZESTRIL) 20 MG tablet Take 20 mg by mouth daily.    Marland Kitchen LORazepam (ATIVAN) 0.5 MG tablet Take 0.5 mg by mouth 2 (two) times daily as needed for anxiety.    . metoprolol tartrate (LOPRESSOR) 25 MG tablet Take 25 mg by mouth 2 (two) times daily.    . naproxen (NAPROSYN) 500 MG tablet Take 500 mg by mouth 2 (two) times daily with a meal.    . triamcinolone ointment (KENALOG) 0.1 % Apply 1 application topically 2 (two) times daily as needed.    Marland Kitchen VITAMIN B1-B12 IM  Inject 1,000 mcg into the muscle.    Marland Kitchen VITAMIN D PO Take 1 tablet by mouth daily.     No current facility-administered medications for this visit.      Marland Kitchen  PHYSICAL EXAMINATION: ECOG PERFORMANCE STATUS: 0 - Asymptomatic  Vitals:   11/09/20 1100  BP: (!) 150/52  Pulse: 66  Resp: 20  Temp: (!) 96.7 F (35.9 C)   Filed Weights   11/09/20 1100  Weight: 240 lb (108.9 kg)    Physical Exam HENT:     Head: Normocephalic and atraumatic.     Mouth/Throat:     Mouth: Oropharynx is clear and moist.     Pharynx: No oropharyngeal exudate.  Eyes:     Pupils: Pupils are equal, round, and reactive to light.  Cardiovascular:  Rate and Rhythm: Normal rate and regular rhythm.  Pulmonary:     Effort: Pulmonary effort is normal. No respiratory distress.     Breath sounds: Normal breath sounds. No wheezing.  Abdominal:     General: Bowel sounds are normal. There is no distension.     Palpations: Abdomen is soft. There is no mass.     Tenderness: There is no abdominal tenderness. There is no guarding or rebound.  Musculoskeletal:        General: No tenderness or edema. Normal range of motion.     Cervical back: Normal range of motion and neck supple.  Skin:    General: Skin is warm.  Neurological:     Mental Status: She is alert and oriented to person, place, and time.  Psychiatric:        Mood and Affect: Affect normal.      LABORATORY DATA:  I have reviewed the data as listed No results found for: WBC, HGB, HCT, MCV, PLT No results for input(s): NA, K, CL, CO2, GLUCOSE, BUN, CREATININE, CALCIUM, GFRNONAA, GFRAA, PROT, ALBUMIN, AST, ALT, ALKPHOS, BILITOT, BILIDIR, IBILI in the last 8760 hours.  RADIOGRAPHIC STUDIES: I have personally reviewed the radiological images as listed and agreed with the findings in the report. MM CLIP PLACEMENT LEFT  Result Date: 11/03/2020 CLINICAL DATA:  Post procedure mammogram for clip placement. EXAM: DIAGNOSTIC LEFT MAMMOGRAM POST  STEREOTACTIC BIOPSY COMPARISON:  Previous exam(s). FINDINGS: Mammographic images were obtained following stereotactic guided biopsy of calcifications in the outer left breast, posterior. The coil biopsy marking clip is appropriately located on the cc view but may be displaced inferiorly by approximately 2 cm (this is somewhat difficult to determine due to air and lidocaine obscuring the biopsied area). Mammographic images were obtained following stereotactic guided biopsy of calcifications in the outer left breast, anterior. The X biopsy marking clip similarly may be displaced by approximately 1.5 cm inferior to the biopsy cavity. IMPRESSION: Possible inferior displacement of both biopsy marking clips prior approximately 1-2 cm, somewhat difficult to determine on the postprocedure mammogram due to lidocaine and air obscuring the biopsy sites. If patient requires surgical excision, recommend repeating a full field mL view at the time of localization. Final Assessment: Post Procedure Mammograms for Marker Placement Electronically Signed   By: Audie Pinto M.D.   On: 11/03/2020 09:23   MM LT BREAST BX W LOC DEV 1ST LESION IMAGE BX SPEC STEREO GUIDE  Result Date: 11/03/2020 CLINICAL DATA:  74 year old female presenting for biopsy of two sites of left breast calcifications. EXAM: LEFT BREAST STEREOTACTIC CORE NEEDLE BIOPSY x 2 COMPARISON:  Previous exams. FINDINGS: The patient and I discussed the procedure of stereotactic-guided biopsy including benefits and alternatives. We discussed the high likelihood of a successful procedure. We discussed the risks of the procedure including infection, bleeding, tissue injury, clip migration, and inadequate sampling. Informed written consent was given. The usual time out protocol was performed immediately prior to the procedure. 1. Using sterile technique and 1% Lidocaine as local anesthetic, under stereotactic guidance, a 9 gauge vacuum assisted device was used to perform  core needle biopsy of calcifications in the outer left breast, posterior using a superior approach. Specimen radiograph was performed showing at least 5 specimens with calcifications. Specimens with calcifications are identified for pathology. Lesion quadrant: Lower outer quadrant At the conclusion of the procedure, coil tissue marker clip was deployed into the biopsy cavity. Follow-up 2-view mammogram was performed and dictated separately. 2. Using sterile technique  and 1% Lidocaine as local anesthetic, under stereotactic guidance, a 9 gauge vacuum assisted device was used to perform core needle biopsy of calcifications in the outer left breast, anterior using a superior approach. Specimen radiograph was performed showing at least 4 specimens with calcifications. Specimens with calcifications are identified for pathology. Lesion quadrant: Lower outer quadrant At the conclusion of the procedure, an X tissue marker clip was deployed into the biopsy cavity. Follow-up 2-view mammogram was performed and dictated separately. IMPRESSION: Stereotactic-guided biopsy of calcifications in the outer left breast, at both the anterior and posterior aspect. No apparent complications. Electronically Signed   By: Audie Pinto M.D.   On: 11/03/2020 09:26   MM LT BREAST BX W LOC DEV EA AD LESION IMG BX SPEC STEREO GUIDE  Result Date: 11/03/2020 CLINICAL DATA:  75 year old female presenting for biopsy of two sites of left breast calcifications. EXAM: LEFT BREAST STEREOTACTIC CORE NEEDLE BIOPSY x 2 COMPARISON:  Previous exams. FINDINGS: The patient and I discussed the procedure of stereotactic-guided biopsy including benefits and alternatives. We discussed the high likelihood of a successful procedure. We discussed the risks of the procedure including infection, bleeding, tissue injury, clip migration, and inadequate sampling. Informed written consent was given. The usual time out protocol was performed immediately prior to  the procedure. 1. Using sterile technique and 1% Lidocaine as local anesthetic, under stereotactic guidance, a 9 gauge vacuum assisted device was used to perform core needle biopsy of calcifications in the outer left breast, posterior using a superior approach. Specimen radiograph was performed showing at least 5 specimens with calcifications. Specimens with calcifications are identified for pathology. Lesion quadrant: Lower outer quadrant At the conclusion of the procedure, coil tissue marker clip was deployed into the biopsy cavity. Follow-up 2-view mammogram was performed and dictated separately. 2. Using sterile technique and 1% Lidocaine as local anesthetic, under stereotactic guidance, a 9 gauge vacuum assisted device was used to perform core needle biopsy of calcifications in the outer left breast, anterior using a superior approach. Specimen radiograph was performed showing at least 4 specimens with calcifications. Specimens with calcifications are identified for pathology. Lesion quadrant: Lower outer quadrant At the conclusion of the procedure, an X tissue marker clip was deployed into the biopsy cavity. Follow-up 2-view mammogram was performed and dictated separately. IMPRESSION: Stereotactic-guided biopsy of calcifications in the outer left breast, at both the anterior and posterior aspect. No apparent complications. Electronically Signed   By: Audie Pinto M.D.   On: 11/03/2020 09:26    ASSESSMENT & PLAN:   Ductal carcinoma in situ (DCIS) of left breast #Left breast DCIS with comedonecrosis x2 biopsies.  Biopsy #1 microinvasive disease cannot be excluded.  ER positive.   #I had a long discussion with patient regarding pathology/natural history of DCIS.  Also had a long discussion regarding the treatment options which include lumpectomy.  Question need for sentinel lymph node biopsy based on comedonecrosis.  Will defer to surgery.  # Also discussed regarding radiation therapy post  lumpectomy.  In general radiation the cuts down the risk of recurrence by 50%. also discussed option of antihormone therapy-like anastrozole/Aromasin to cut down the risk of development development of ipsilateral/contralateral invasive/noninvasive breast cancer.  Also discussed a small chance of discovering invasive cancer post surgery-which may or may not change adjuvant treatment strategy.  # Also discussed at length- the option of phase 3 clinical trial COMET which randomizes patient to standard treatment versus experimental [antihormone therapy only].  However, given microinvasive disease I  doubt if patient would be candidate for the trial.  Will discuss at tumor conference.  Discussed with clinical trials nurse.  #Left breast bruising post biopsy; recommend holding aspirin for 1 more week.  Recommend ice packing.   # Thank you for allowing me to participate in the care of your pleasant patient. Please do not hesitate to contact me with questions or concerns in the interim.  # DISPOSITION: # follow up TBD-Dr.B   All questions were answered. The patient/family knows to call the clinic with any problems, questions or concerns.   Cammie Sickle, MD 11/09/2020 12:43 PM

## 2020-11-09 NOTE — Progress Notes (Signed)
Patient is aware of surgical consult scheduled today with Dr. Windell Moment at 2:00.  Will follow-up with patient after initial Med/Onc and surgical consults today.

## 2020-11-10 ENCOUNTER — Other Ambulatory Visit: Payer: Self-pay | Admitting: General Surgery

## 2020-11-10 DIAGNOSIS — C50412 Malignant neoplasm of upper-outer quadrant of left female breast: Secondary | ICD-10-CM

## 2020-11-10 DIAGNOSIS — Z170 Estrogen receptor positive status [ER+]: Secondary | ICD-10-CM

## 2020-11-15 ENCOUNTER — Other Ambulatory Visit: Payer: Self-pay

## 2020-11-15 ENCOUNTER — Ambulatory Visit: Payer: Self-pay | Admitting: General Surgery

## 2020-11-15 ENCOUNTER — Encounter
Admission: RE | Admit: 2020-11-15 | Discharge: 2020-11-15 | Disposition: A | Discharge status: Home / Self Care | Payer: Medicare Other | Source: Ambulatory Visit | Attending: General Surgery | Admitting: General Surgery

## 2020-11-15 NOTE — H&P (Signed)
PATIENT PROFILE: Kelly Howell is a 75 y.o. female who presents to the Clinic for consultation at the request of Dr. Kary Kos for evaluation of breast cancer.  PCP: Lovie Macadamia, MD  HISTORY OF PRESENT ILLNESS: Kelly Howell reports she had a mammogram that was found with calcification. Diagnostic mammogram confirmed at 3 cm span of calcifications. 2 core needle biopsy were done at the 2 ends of the calcifications. 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 There was a small area of concern of microinvasion. I personally evaluated the images. Patient denies any breast pain, any skin changes, any nipple discharge, any palpable masses.  Family history of breast cancer: Grandmother Family history of other cancers: Mother with colon cancer Menarche: Around 75 years old Menopause: 75 years old Used OCP: Yes Used estrogen and progesterone therapy: No History of Radiation to the chest: No Previous breast biopsy: Yes Number of pregnancies: 1 Age of pregnancy: 75 years old  PROBLEM LIST: Problem List Date Reviewed: 07/20/2020  Noted  BMI 40.0-44.9, adult (CMS-HCC) (Chronic) 11/25/2019  Spondylolisthesis of lumbar region 09/10/2014  Sciatica neuralgia, right 09/10/2014  Primary osteoarthritis of both knees 07/14/2014  Arthrosis of knee 07/07/2014    GENERAL REVIEW OF SYSTEMS:   General ROS: negative for - chills, fatigue, fever, weight gain or weight loss Allergy and Immunology ROS: negative for - hives  Hematological and Lymphatic ROS: negative for - bleeding problems or bruising, negative for palpable nodes Endocrine ROS: negative for - heat or cold intolerance, hair changes Respiratory ROS: negative for - cough, shortness of breath or wheezing Cardiovascular ROS: no chest pain or palpitations GI ROS: negative for nausea, vomiting, abdominal pain, diarrhea, constipation Musculoskeletal ROS: negative for - joint swelling or muscle  pain Neurological ROS: negative for - confusion, syncope Dermatological ROS: negative for pruritus and rash Psychiatric: negative for anxiety, depression, difficulty sleeping and memory loss  MEDICATIONS: Current Outpatient Medications  Medication Sig Dispense Refill  . aspirin 81 MG EC tablet Take 81 mg by mouth once daily.  Marland Kitchen atorvastatin (LIPITOR) 20 MG tablet TAKE 1 TABLET BY MOUTH EVERY DAY 90 tablet 3  . BD LUER-LOK SYRINGE 3 mL 25 gauge x 1" Syrg USE 1 EACH MONTHLY 3 Syringe 3  . co-enzyme Q-10, ubiquinone, (COQ-10) 100 mg capsule Take 100 mg by mouth once daily.  . cyanocobalamin (VITAMIN B12) 1,000 mcg/mL injection Inject 1 mL (1,000 mcg total) into the muscle monthly 3 mL 3  . fexofenadine (ALLEGRA) 180 MG tablet Take 180 mg by mouth once daily as needed.  . fluticasone (FLONASE) 50 mcg/actuation nasal spray USE 2 SPRAYS INTO EACH NOSTRIL EVERY DAY 16 g 5  . hyaluronate (SYNVISC) 16 mg/2 mL injection Inject into the articular space once a week.  Marland Kitchen lisinopriL (ZESTRIL) 20 MG tablet TAKE 1 TABLET BY MOUTH EVERY DAY 90 tablet 3  . naproxen (NAPROSYN) 500 MG tablet TAKE 1 TABLET BY MOUTH TWICE A DAY WITH MEALS 180 tablet 1  . nystatin (MYCOSTATIN) 100,000 unit/gram cream Apply topically 2 (two) times daily. 30 g 1  . triamcinolone 0.1 % ointment APPLY TO INFLAMED AREAS BID PRN 1  . metoprolol tartrate (LOPRESSOR) 25 MG tablet TAKE 1 TABLET BY MOUTH TWICE A DAY 180 tablet 3   No current facility-administered medications for this visit.   ALLERGIES: Gantrisin [sulfisoxazole acetyl] and Sulfa (sulfonamide antibiotics)  PAST MEDICAL HISTORY: Past Medical History:  Diagnosis Date  . Abnormal mammogram  . Atypical hyperplasia  of left breast  s/p lumpectomy  . Colon polyps  . Diverticulosis 07/04/2016  . Dumping syndrome  . Endometrial thickening on ultra sound  with negative endometrial biopsy 07/2004  . Hyperlipidemia  . Hypertension  . IBS (irritable bowel syndrome)  .  Intermittent constipation  . Intermittent diarrhea  . Left sided lacunar infarction (CMS-HCC)  versus vertigo, according to patient  . OA (osteoarthritis)  . Obesity  . Torus palatinus   PAST SURGICAL HISTORY: Past Surgical History:  Procedure Laterality Date  . [place] 2015  R elbow  . CESAREAN SECTION 1983  . CHOLECYSTECTOMY 1991  . COLONOSCOPY 10/2010  5 yrs  . COLONOSCOPY 07/04/2016  Diverticulosis/FHx colon cancer-Mother/Repeat 58yr/MUS  . Excisional biopsy and lumpectomy of the left breast 07/2000  Evidence of dysplasia; patient treated with tamoxifen for 5 years  . Left wrist biopsy with dysplasia 2000  . Pneumovax 01/2014  . 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 Tdap 12/2011  . Wisdom teeth extraction 1966  . Zostavax 12/2010    FAMILY HISTORY: Family History  Problem Relation Age of Onset  . Myocardial Infarction (Heart attack) Father  . Stroke Father 732 . High blood pressure (Hypertension) Mother  . Colon cancer Mother 586 . High blood pressure (Hypertension) Other  Grandparent  . Thyroid disease Other  Grandparent    SOCIAL HISTORY: Social History   Socioeconomic History  . Marital status: Married  Spouse name: Not on file  . Number of children: Not on file  . Years of education: Not on file  . Highest education level: Not on file  Occupational History  . Not on file  Tobacco Use  . Smoking status: Never Smoker  . Smokeless tobacco: Never Used  Vaping Use  . Vaping Use: Never used  Substance and Sexual Activity  . Alcohol use: No  Alcohol/week: 0.0 standard drinks  . Drug use: No  . Sexual activity: Yes  Partners: Male  Other Topics Concern  . Not on file  Social History Narrative  . Not on file   Social Determinants of Health   Financial Resource Strain: Not on file  Food Insecurity: Not on file  Transportation Needs: Not on file   PHYSICAL EXAM: Vitals:  11/09/20 1343  BP: 134/69  Pulse: 72   Body mass index is  41.2 kg/m. Weight: (!) 108.9 kg (240 lb)   GENERAL: Alert, active, oriented x3  HEENT: Pupils equal reactive to light. Extraocular movements are intact. Sclera clear. Palpebral conjunctiva normal red color.Pharynx clear.  NECK: Supple with no palpable mass and no adenopathy.  LUNGS: Sound clear with no rales rhonchi or wheezes.  HEART: Regular rhythm S1 and S2 without murmur.  BREAST: breasts appear normal, no suspicious masses, no skin or nipple changes or axillary nodes.  ABDOMEN: Soft and depressible, nontender with no palpable mass, no hepatomegaly.  EXTREMITIES: Well-developed well-nourished symmetrical with no dependent edema.  NEUROLOGICAL: Awake alert oriented, facial expression symmetrical, moving all extremities.  REVIEW OF DATA: I have reviewed the following data today: Initial consult on 11/09/2020  Component Date Value  . Glucose 11/09/2020 84  . Sodium 11/09/2020 142  . Potassium 11/09/2020 4.1  . Chloride 11/09/2020 108  . Carbon Dioxide (CO2) 11/09/2020 31.5  . Urea Nitrogen (BUN) 11/09/2020 19  . Creatinine 11/09/2020 0.8  . Glomerular Filtration Ra* 11/09/2020 70  . Calcium 11/09/2020 9.0  . AST 11/09/2020 17  . ALT 11/09/2020 13  . Alk Phos (alkaline Phosp* 11/09/2020  90  . Albumin 11/09/2020 3.8  . Bilirubin, Total 11/09/2020 0.5  . Protein, Total 11/09/2020 6.3  . A/G Ratio 11/09/2020 1.5  . WBC (White Blood Cell Co* 11/09/2020 7.2  . RBC (Red Blood Cell Coun* 11/09/2020 4.22  . Hemoglobin 11/09/2020 13.4  . Hematocrit 11/09/2020 40.5  . MCV (Mean Corpuscular Vo* 11/09/2020 96.0  . MCH (Mean Corpuscular He* 11/09/2020 31.8 (!)  . MCHC (Mean Corpuscular H* 11/09/2020 33.1  . Platelet Count 11/09/2020 220  . RDW-CV (Red Cell Distrib* 11/09/2020 12.1  . MPV (Mean Platelet Volum* 11/09/2020 11.8  . Neutrophils 11/09/2020 4.83  . Lymphocytes 11/09/2020 1.52  . Monocytes 11/09/2020 0.72  . Eosinophils 11/09/2020 0.10  . Basophils 11/09/2020 0.04   . Neutrophil % 11/09/2020 66.7  . Lymphocyte % 11/09/2020 21.0  . Monocyte % 11/09/2020 10.0  . Eosinophil % 11/09/2020 1.4  . Basophil% 11/09/2020 0.6  . Immature Granulocyte % 11/09/2020 0.3  . Immature Granulocyte Cou* 11/09/2020 0.02    ASSESSMENT: Kelly Howell is a 75 y.o. female presenting for consultation for left breast cancer.   Patient was oriented again about the pathology results. Surgical alternatives were discussed with patient including partial vs total mastectomy. Surgical technique and post operative care was discussed with patient. Risk of surgery was discussed with patient including but not limited to: wound infection, seroma, hematoma, brachial plexopathy, mondor's disease (thrombosis of small veins of breast), chronic wound pain, breast lymphedema, altered sensation to the nipple and cosmesis among others.   Malignant neoplasm of upper-outer quadrant of left breast in female, estrogen receptor positive (CMS-HCC) [C50.412, Z17.0]  PLAN: 1. Left breast Needle guided partial mastectomy and sentinel lymph node biopsy. (19301, [id number]) 2. CBC, CMP 3. Hold aspirin 5 days before the surgery 4. Contact us if you have any concern.   Patient verbalized understanding, all questions were answered, and were agreeable with the plan outlined above.   Herbert Pun, MD  Electronically signed by Herbert Pun, MD

## 2020-11-15 NOTE — H&P (View-Only) (Signed)
PATIENT PROFILE: Kelly Howell is a 75 y.o. female who presents to the Clinic for consultation at the request of Dr. Kary Kos for evaluation of breast cancer.  PCP: Lovie Macadamia, MD  HISTORY OF PRESENT ILLNESS: Kelly Howell reports she had a mammogram that was found with calcification. Diagnostic mammogram confirmed at 3 cm span of calcifications. 2 core needle biopsy were done at the 2 ends of the calcifications. 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 There was a small area of concern of microinvasion. I personally evaluated the images. Patient denies any breast pain, any skin changes, any nipple discharge, any palpable masses.  Family history of breast cancer: Grandmother Family history of other cancers: Mother with colon cancer Menarche: Around 75 years old Menopause: 75 years old Used OCP: Yes Used estrogen and progesterone therapy: No History of Radiation to the chest: No Previous breast biopsy: Yes Number of pregnancies: 1 Age of pregnancy: 75 years old  PROBLEM LIST: Problem List Date Reviewed: 07/20/2020  Noted  BMI 40.0-44.9, adult (CMS-HCC) (Chronic) 11/25/2019  Spondylolisthesis of lumbar region 09/10/2014  Sciatica neuralgia, right 09/10/2014  Primary osteoarthritis of both knees 07/14/2014  Arthrosis of knee 07/07/2014    GENERAL REVIEW OF SYSTEMS:   General ROS: negative for - chills, fatigue, fever, weight gain or weight loss Allergy and Immunology ROS: negative for - hives  Hematological and Lymphatic ROS: negative for - bleeding problems or bruising, negative for palpable nodes Endocrine ROS: negative for - heat or cold intolerance, hair changes Respiratory ROS: negative for - cough, shortness of breath or wheezing Cardiovascular ROS: no chest pain or palpitations GI ROS: negative for nausea, vomiting, abdominal pain, diarrhea, constipation Musculoskeletal ROS: negative for - joint swelling or muscle  pain Neurological ROS: negative for - confusion, syncope Dermatological ROS: negative for pruritus and rash Psychiatric: negative for anxiety, depression, difficulty sleeping and memory loss  MEDICATIONS: Current Outpatient Medications  Medication Sig Dispense Refill  . aspirin 81 MG EC tablet Take 81 mg by mouth once daily.  Marland Kitchen atorvastatin (LIPITOR) 20 MG tablet TAKE 1 TABLET BY MOUTH EVERY DAY 90 tablet 3  . BD LUER-LOK SYRINGE 3 mL 25 gauge x 1" Syrg USE 1 EACH MONTHLY 3 Syringe 3  . co-enzyme Q-10, ubiquinone, (COQ-10) 100 mg capsule Take 100 mg by mouth once daily.  . cyanocobalamin (VITAMIN B12) 1,000 mcg/mL injection Inject 1 mL (1,000 mcg total) into the muscle monthly 3 mL 3  . fexofenadine (ALLEGRA) 180 MG tablet Take 180 mg by mouth once daily as needed.  . fluticasone (FLONASE) 50 mcg/actuation nasal spray USE 2 SPRAYS INTO EACH NOSTRIL EVERY DAY 16 g 5  . hyaluronate (SYNVISC) 16 mg/2 mL injection Inject into the articular space once a week.  Marland Kitchen lisinopriL (ZESTRIL) 20 MG tablet TAKE 1 TABLET BY MOUTH EVERY DAY 90 tablet 3  . naproxen (NAPROSYN) 500 MG tablet TAKE 1 TABLET BY MOUTH TWICE A DAY WITH MEALS 180 tablet 1  . nystatin (MYCOSTATIN) 100,000 unit/gram cream Apply topically 2 (two) times daily. 30 g 1  . triamcinolone 0.1 % ointment APPLY TO INFLAMED AREAS BID PRN 1  . metoprolol tartrate (LOPRESSOR) 25 MG tablet TAKE 1 TABLET BY MOUTH TWICE A DAY 180 tablet 3   No current facility-administered medications for this visit.   ALLERGIES: Gantrisin [sulfisoxazole acetyl] and Sulfa (sulfonamide antibiotics)  PAST MEDICAL HISTORY: Past Medical History:  Diagnosis Date  . Abnormal mammogram  . Atypical hyperplasia  of left breast  s/p lumpectomy  . Colon polyps  . Diverticulosis 07/04/2016  . Dumping syndrome  . Endometrial thickening on ultra sound  with negative endometrial biopsy 07/2004  . Hyperlipidemia  . Hypertension  . IBS (irritable bowel syndrome)  .  Intermittent constipation  . Intermittent diarrhea  . Left sided lacunar infarction (CMS-HCC)  versus vertigo, according to patient  . OA (osteoarthritis)  . Obesity  . Torus palatinus   PAST SURGICAL HISTORY: Past Surgical History:  Procedure Laterality Date  . [place] 2015  R elbow  . CESAREAN SECTION 1983  . CHOLECYSTECTOMY 1991  . COLONOSCOPY 10/2010  5 yrs  . COLONOSCOPY 07/04/2016  Diverticulosis/FHx colon cancer-Mother/Repeat 21yr/MUS  . Excisional biopsy and lumpectomy of the left breast 07/2000  Evidence of dysplasia; patient treated with tamoxifen for 5 years  . Left wrist biopsy with dysplasia 2000  . Pneumovax 01/2014  . 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 Tdap 12/2011  . Wisdom teeth extraction 1966  . Zostavax 12/2010    FAMILY HISTORY: Family History  Problem Relation Age of Onset  . Myocardial Infarction (Heart attack) Father  . Stroke Father 753 . High blood pressure (Hypertension) Mother  . Colon cancer Mother 524 . High blood pressure (Hypertension) Other  Grandparent  . Thyroid disease Other  Grandparent    SOCIAL HISTORY: Social History   Socioeconomic History  . Marital status: Married  Spouse name: Not on file  . Number of children: Not on file  . Years of education: Not on file  . Highest education level: Not on file  Occupational History  . Not on file  Tobacco Use  . Smoking status: Never Smoker  . Smokeless tobacco: Never Used  Vaping Use  . Vaping Use: Never used  Substance and Sexual Activity  . Alcohol use: No  Alcohol/week: 0.0 standard drinks  . Drug use: No  . Sexual activity: Yes  Partners: Male  Other Topics Concern  . Not on file  Social History Narrative  . Not on file   Social Determinants of Health   Financial Resource Strain: Not on file  Food Insecurity: Not on file  Transportation Needs: Not on file   PHYSICAL EXAM: Vitals:  11/09/20 1343  BP: 134/69  Pulse: 72   Body mass index is  41.2 kg/m. Weight: (!) 108.9 kg (240 lb)   GENERAL: Alert, active, oriented x3  HEENT: Pupils equal reactive to light. Extraocular movements are intact. Sclera clear. Palpebral conjunctiva normal red color.Pharynx clear.  NECK: Supple with no palpable mass and no adenopathy.  LUNGS: Sound clear with no rales rhonchi or wheezes.  HEART: Regular rhythm S1 and S2 without murmur.  BREAST: breasts appear normal, no suspicious masses, no skin or nipple changes or axillary nodes.  ABDOMEN: Soft and depressible, nontender with no palpable mass, no hepatomegaly.  EXTREMITIES: Well-developed well-nourished symmetrical with no dependent edema.  NEUROLOGICAL: Awake alert oriented, facial expression symmetrical, moving all extremities.  REVIEW OF DATA: I have reviewed the following data today: Initial consult on 11/09/2020  Component Date Value  . Glucose 11/09/2020 84  . Sodium 11/09/2020 142  . Potassium 11/09/2020 4.1  . Chloride 11/09/2020 108  . Carbon Dioxide (CO2) 11/09/2020 31.5  . Urea Nitrogen (BUN) 11/09/2020 19  . Creatinine 11/09/2020 0.8  . Glomerular Filtration Ra* 11/09/2020 70  . Calcium 11/09/2020 9.0  . AST 11/09/2020 17  . ALT 11/09/2020 13  . Alk Phos (alkaline Phosp* 11/09/2020  90  . Albumin 11/09/2020 3.8  . Bilirubin, Total 11/09/2020 0.5  . Protein, Total 11/09/2020 6.3  . A/G Ratio 11/09/2020 1.5  . WBC (White Blood Cell Co* 11/09/2020 7.2  . RBC (Red Blood Cell Coun* 11/09/2020 4.22  . Hemoglobin 11/09/2020 13.4  . Hematocrit 11/09/2020 40.5  . MCV (Mean Corpuscular Vo* 11/09/2020 96.0  . MCH (Mean Corpuscular He* 11/09/2020 31.8 (!)  . MCHC (Mean Corpuscular H* 11/09/2020 33.1  . Platelet Count 11/09/2020 220  . RDW-CV (Red Cell Distrib* 11/09/2020 12.1  . MPV (Mean Platelet Volum* 11/09/2020 11.8  . Neutrophils 11/09/2020 4.83  . Lymphocytes 11/09/2020 1.52  . Monocytes 11/09/2020 0.72  . Eosinophils 11/09/2020 0.10  . Basophils 11/09/2020 0.04   . Neutrophil % 11/09/2020 66.7  . Lymphocyte % 11/09/2020 21.0  . Monocyte % 11/09/2020 10.0  . Eosinophil % 11/09/2020 1.4  . Basophil% 11/09/2020 0.6  . Immature Granulocyte % 11/09/2020 0.3  . Immature Granulocyte Cou* 11/09/2020 0.02    ASSESSMENT: Kelly Howell is a 75 y.o. female presenting for consultation for left breast cancer.   Patient was oriented again about the pathology results. Surgical alternatives were discussed with patient including partial vs total mastectomy. Surgical technique and post operative care was discussed with patient. Risk of surgery was discussed with patient including but not limited to: wound infection, seroma, hematoma, brachial plexopathy, mondor's disease (thrombosis of small veins of breast), chronic wound pain, breast lymphedema, altered sensation to the nipple and cosmesis among others.   Malignant neoplasm of upper-outer quadrant of left breast in female, estrogen receptor positive (CMS-HCC) [C50.412, Z17.0]  PLAN: 1. Left breast Needle guided partial mastectomy and sentinel lymph node biopsy. (19301, [id number]) 2. CBC, CMP 3. Hold aspirin 5 days before the surgery 4. Contact us if you have any concern.   Patient verbalized understanding, all questions were answered, and were agreeable with the plan outlined above.   Herbert Pun, MD  Electronically signed by Herbert Pun, MD

## 2020-11-15 NOTE — Patient Instructions (Signed)
Your procedure is scheduled on: 11/19/20 Report to [facility] at 7:45 as scheduled Same Day Surgery Dept. [phone]   Remember: Instructions that are not followed completely may result in serious medical risk, up to and including death, or upon the discretion of your surgeon and anesthesiologist your surgery may need to be rescheduled.     _X__ 1. Do not eat food after midnight the night before your procedure.                 No gum chewing or hard candies. You may drink clear liquids up to 2 hours                 before you are scheduled to arrive for your surgery- DO not drink clear                 liquids within 2 hours of the start of your surgery.                 Clear Liquids include:  water, apple juice without pulp, clear carbohydrate                 drink such as Clearfast or Gatorade, Black Coffee or Tea (Do not add                 anything to coffee or tea). Diabetics water only  __X__2.  On the morning of surgery brush your teeth with toothpaste and water, you                 may rinse your mouth with mouthwash if you wish.  Do not swallow any              toothpaste of mouthwash.     _X__ 3.  No Alcohol for 24 hours before or after surgery.   _X__ 4.  Do Not Smoke or use e-cigarettes For 24 Hours Prior to Your Surgery.                 Do not use any chewable tobacco products for at least 6 hours prior to                 surgery.  ____  5.  Bring all medications with you on the day of surgery if instructed.   __X__  6.  Notify your doctor if there is any change in your medical condition      (cold, fever, infections).     Do not wear jewelry, make-up, hairpins, clips or nail polish. Do not wear lotions, powders, or perfumes.  Do not shave 48 hours prior to surgery. Men may shave face and neck. Do not bring valuables to the hospital.    [facility] is not responsible for any belongings or valuables.  Contacts, dentures/partials or body piercings may not be worn  into surgery. Bring a case for your contacts, glasses or hearing aids, a denture cup will be supplied. Leave your suitcase in the car. After surgery it may be brought to your room. For patients admitted to the hospital, discharge time is determined by your treatment team.   Patients discharged the day of surgery will not be allowed to drive home.   Please read over the following fact sheets that you were given:   MRSA Information, chg soap  __X__ Take these medicines the morning of surgery with A SIP OF WATER:    1. metoprolol tartrate (LOPRESSOR) 25 MG tablet  2. fexofenadine (  ALLEGRA) 180 MG tablet  3.   4.  5.  6.  ____ Fleet Enema (as directed)   __X__ Use CHG Soap/SAGE wipes as directed  ____ Use inhalers on the day of surgery  ____ Stop metformin/Janumet/Farxiga 2 days prior to surgery    ____ Take 1/2 of usual insulin dose the night before surgery. No insulin the morning          of surgery.   ____ Stop Blood Thinners Coumadin/Plavix/Xarelto/Pleta/Pradaxa/Eliquis/Effient/Aspirin  on   Or contact your Surgeon, Cardiologist or Medical Doctor regarding  ability to stop your blood thinners  __X__ Stop Anti-inflammatories 7 days before surgery such as Advil, Ibuprofen, Motrin,  BC or Goodies Powder, Naprosyn, Naproxen, Aleve, Aspirin    __X__ Stop all herbal supplements, fish oil or vitamin E until after surgery.    ____ Bring C-Pap to the hospital.

## 2020-11-17 ENCOUNTER — Other Ambulatory Visit: Payer: Self-pay

## 2020-11-17 ENCOUNTER — Encounter
Admission: RE | Admit: 2020-11-17 | Discharge: 2020-11-17 | Disposition: A | Discharge status: Home / Self Care | Payer: Medicare Other | Source: Ambulatory Visit | Attending: General Surgery | Admitting: General Surgery

## 2020-11-17 ENCOUNTER — Other Ambulatory Visit: Payer: Medicare Other

## 2020-11-17 DIAGNOSIS — E785 Hyperlipidemia, unspecified: Secondary | ICD-10-CM | POA: Diagnosis not present

## 2020-11-17 DIAGNOSIS — Z20822 Contact with and (suspected) exposure to covid-19: Secondary | ICD-10-CM | POA: Diagnosis not present

## 2020-11-17 DIAGNOSIS — I10 Essential (primary) hypertension: Secondary | ICD-10-CM | POA: Insufficient documentation

## 2020-11-17 DIAGNOSIS — Z01818 Encounter for other preprocedural examination: Secondary | ICD-10-CM | POA: Insufficient documentation

## 2020-11-17 LAB — SARS CORONAVIRUS 2 (TAT 6-24 HRS): SARS Coronavirus 2: NEGATIVE

## 2020-11-19 ENCOUNTER — Ambulatory Visit: Payer: Medicare Other | Admitting: Urgent Care

## 2020-11-19 ENCOUNTER — Encounter: Payer: Self-pay | Admitting: General Surgery

## 2020-11-19 ENCOUNTER — Ambulatory Visit
Admission: RE | Admit: 2020-11-19 | Discharge: 2020-11-19 | Disposition: A | Discharge status: Home / Self Care | Payer: Medicare Other | Source: Ambulatory Visit | Attending: General Surgery | Admitting: General Surgery

## 2020-11-19 ENCOUNTER — Ambulatory Visit
Admission: RE | Admit: 2020-11-19 | Discharge: 2020-11-19 | Disposition: A | Discharge status: Home / Self Care | Payer: Medicare Other | Attending: General Surgery | Admitting: General Surgery

## 2020-11-19 ENCOUNTER — Encounter
Admission: RE | Disposition: A | Discharge status: Home / Self Care | Payer: Self-pay | Source: Home / Self Care | Attending: General Surgery

## 2020-11-19 ENCOUNTER — Other Ambulatory Visit: Payer: Self-pay

## 2020-11-19 DIAGNOSIS — Z803 Family history of malignant neoplasm of breast: Secondary | ICD-10-CM | POA: Diagnosis not present

## 2020-11-19 DIAGNOSIS — Z800 Family history of malignant neoplasm of digestive organs: Secondary | ICD-10-CM | POA: Insufficient documentation

## 2020-11-19 DIAGNOSIS — K589 Irritable bowel syndrome without diarrhea: Secondary | ICD-10-CM | POA: Diagnosis not present

## 2020-11-19 DIAGNOSIS — C50412 Malignant neoplasm of upper-outer quadrant of left female breast: Secondary | ICD-10-CM

## 2020-11-19 DIAGNOSIS — E785 Hyperlipidemia, unspecified: Secondary | ICD-10-CM | POA: Diagnosis not present

## 2020-11-19 DIAGNOSIS — Z170 Estrogen receptor positive status [ER+]: Secondary | ICD-10-CM

## 2020-11-19 DIAGNOSIS — Z8719 Personal history of other diseases of the digestive system: Secondary | ICD-10-CM | POA: Insufficient documentation

## 2020-11-19 DIAGNOSIS — D360 Benign neoplasm of lymph nodes: Secondary | ICD-10-CM | POA: Diagnosis not present

## 2020-11-19 DIAGNOSIS — Z6841 Body Mass Index (BMI) 40.0 and over, adult: Secondary | ICD-10-CM | POA: Insufficient documentation

## 2020-11-19 DIAGNOSIS — Z9049 Acquired absence of other specified parts of digestive tract: Secondary | ICD-10-CM | POA: Insufficient documentation

## 2020-11-19 DIAGNOSIS — I10 Essential (primary) hypertension: Secondary | ICD-10-CM | POA: Diagnosis not present

## 2020-11-19 DIAGNOSIS — Z882 Allergy status to sulfonamides status: Secondary | ICD-10-CM | POA: Insufficient documentation

## 2020-11-19 DIAGNOSIS — Z79899 Other long term (current) drug therapy: Secondary | ICD-10-CM | POA: Insufficient documentation

## 2020-11-19 DIAGNOSIS — Z85828 Personal history of other malignant neoplasm of skin: Secondary | ICD-10-CM | POA: Insufficient documentation

## 2020-11-19 DIAGNOSIS — Z8249 Family history of ischemic heart disease and other diseases of the circulatory system: Secondary | ICD-10-CM | POA: Diagnosis not present

## 2020-11-19 DIAGNOSIS — Z7982 Long term (current) use of aspirin: Secondary | ICD-10-CM | POA: Insufficient documentation

## 2020-11-19 DIAGNOSIS — E669 Obesity, unspecified: Secondary | ICD-10-CM | POA: Insufficient documentation

## 2020-11-19 DIAGNOSIS — N6092 Unspecified benign mammary dysplasia of left breast: Secondary | ICD-10-CM | POA: Insufficient documentation

## 2020-11-19 DIAGNOSIS — Z8673 Personal history of transient ischemic attack (TIA), and cerebral infarction without residual deficits: Secondary | ICD-10-CM | POA: Diagnosis not present

## 2020-11-19 DIAGNOSIS — Z8379 Family history of other diseases of the digestive system: Secondary | ICD-10-CM | POA: Insufficient documentation

## 2020-11-19 HISTORY — PX: BREAST LUMPECTOMY: SHX2

## 2020-11-19 HISTORY — PX: PARTIAL MASTECTOMY WITH NEEDLE LOCALIZATION AND AXILLARY SENTINEL LYMPH NODE BX: SHX6009

## 2020-11-19 SURGERY — PARTIAL MASTECTOMY WITH NEEDLE LOCALIZATION AND AXILLARY SENTINEL LYMPH NODE BX
Anesthesia: General | Site: Breast | Laterality: Left

## 2020-11-19 MED ORDER — PHENYLEPHRINE HCL (PRESSORS) 10 MG/ML IV SOLN
INTRAVENOUS | Status: DC | PRN
  Administered 2020-11-19: 100 ug via INTRAVENOUS
  Administered 2020-11-19: 80 ug via INTRAVENOUS
  Administered 2020-11-19: 100 ug via INTRAVENOUS

## 2020-11-19 MED ORDER — FENTANYL CITRATE (PF) 100 MCG/2ML IJ SOLN
INTRAMUSCULAR | Status: DC | PRN
  Administered 2020-11-19: 12.5 ug via INTRAVENOUS
  Administered 2020-11-19: 25 ug via INTRAVENOUS
  Administered 2020-11-19: 12.5 ug via INTRAVENOUS
  Administered 2020-11-19: 25 ug via INTRAVENOUS
  Administered 2020-11-19: 12.5 ug via INTRAVENOUS

## 2020-11-19 MED ORDER — PROPOFOL 10 MG/ML IV BOLUS
INTRAVENOUS | Status: DC | PRN
  Administered 2020-11-19 (×2): 40 mg via INTRAVENOUS
  Administered 2020-11-19: 160 mg via INTRAVENOUS
  Administered 2020-11-19: 30 mg via INTRAVENOUS

## 2020-11-19 MED ORDER — PROPOFOL 10 MG/ML IV BOLUS
INTRAVENOUS | Status: AC
  Filled 2020-11-19: qty 20

## 2020-11-19 MED ORDER — LIDOCAINE HCL (CARDIAC) PF 100 MG/5ML IV SOSY
PREFILLED_SYRINGE | INTRAVENOUS | Status: DC | PRN
  Administered 2020-11-19: 80 mg via INTRAVENOUS

## 2020-11-19 MED ORDER — DEXAMETHASONE SODIUM PHOSPHATE 10 MG/ML IJ SOLN
INTRAMUSCULAR | Status: DC | PRN
  Administered 2020-11-19: 8 mg via INTRAVENOUS

## 2020-11-19 MED ORDER — CHLORHEXIDINE GLUCONATE 0.12 % MT SOLN
OROMUCOSAL | Status: AC
  Administered 2020-11-19: 15 mL via OROMUCOSAL
  Filled 2020-11-19: qty 15

## 2020-11-19 MED ORDER — HYDROCODONE-ACETAMINOPHEN 5-325 MG PO TABS: 1.0000 | ORAL_TABLET | ORAL | 0 refills | Status: AC | PRN

## 2020-11-19 MED ORDER — CEFAZOLIN SODIUM-DEXTROSE 2-4 GM/100ML-% IV SOLN: 2.0000 g | INTRAVENOUS | Status: DC

## 2020-11-19 MED ORDER — ACETAMINOPHEN 10 MG/ML IV SOLN
INTRAVENOUS | Status: DC | PRN
  Administered 2020-11-19: 1000 mg via INTRAVENOUS

## 2020-11-19 MED ORDER — CEFAZOLIN SODIUM-DEXTROSE 2-4 GM/100ML-% IV SOLN
INTRAVENOUS | Status: AC
  Filled 2020-11-19: qty 100

## 2020-11-19 MED ORDER — FENTANYL CITRATE (PF) 100 MCG/2ML IJ SOLN
INTRAMUSCULAR | Status: AC
  Administered 2020-11-19: 25 ug via INTRAVENOUS
  Filled 2020-11-19: qty 2

## 2020-11-19 MED ORDER — LACTATED RINGERS IV SOLN: INTRAVENOUS | Status: DC | PRN

## 2020-11-19 MED ORDER — FENTANYL CITRATE (PF) 100 MCG/2ML IJ SOLN
INTRAMUSCULAR | Status: AC
  Filled 2020-11-19: qty 2

## 2020-11-19 MED ORDER — FAMOTIDINE 20 MG PO TABS: 20.0000 mg | ORAL_TABLET | Freq: Once | ORAL | Status: AC

## 2020-11-19 MED ORDER — MIDAZOLAM HCL 2 MG/2ML IJ SOLN
INTRAMUSCULAR | Status: AC
  Filled 2020-11-19: qty 2

## 2020-11-19 MED ORDER — LACTATED RINGERS IV SOLN: INTRAVENOUS | Status: DC

## 2020-11-19 MED ORDER — EPHEDRINE SULFATE 50 MG/ML IJ SOLN
INTRAMUSCULAR | Status: DC | PRN
  Administered 2020-11-19: 10 mg via INTRAVENOUS

## 2020-11-19 MED ORDER — ACETAMINOPHEN 10 MG/ML IV SOLN
INTRAVENOUS | Status: AC
  Filled 2020-11-19: qty 100

## 2020-11-19 MED ORDER — BUPIVACAINE-EPINEPHRINE (PF) 0.5% -1:200000 IJ SOLN
INTRAMUSCULAR | Status: DC | PRN
  Administered 2020-11-19: 10 mL
  Administered 2020-11-19: 20 mL

## 2020-11-19 MED ORDER — FAMOTIDINE 20 MG PO TABS
ORAL_TABLET | ORAL | Status: AC
  Administered 2020-11-19: 20 mg via ORAL
  Filled 2020-11-19: qty 1

## 2020-11-19 MED ORDER — CHLORHEXIDINE GLUCONATE 0.12 % MT SOLN: 15.0000 mL | Freq: Once | OROMUCOSAL | Status: AC

## 2020-11-19 MED ORDER — FENTANYL CITRATE (PF) 100 MCG/2ML IJ SOLN
25.0000 ug | INTRAMUSCULAR | Status: DC | PRN
  Administered 2020-11-19 (×3): 25 ug via INTRAVENOUS

## 2020-11-19 MED ORDER — MIDAZOLAM HCL 2 MG/2ML IJ SOLN
INTRAMUSCULAR | Status: DC | PRN
  Administered 2020-11-19: .25 mg via INTRAVENOUS

## 2020-11-19 MED ORDER — EPINEPHRINE PF 1 MG/ML IJ SOLN
INTRAMUSCULAR | Status: AC
  Filled 2020-11-19: qty 1

## 2020-11-19 MED ORDER — TECHNETIUM TC 99M TILMANOCEPT KIT
1.0000 | PACK | Freq: Once | INTRAVENOUS | Status: AC | PRN
  Administered 2020-11-19: 1.14 via INTRADERMAL

## 2020-11-19 MED ORDER — ORAL CARE MOUTH RINSE: 15.0000 mL | Freq: Once | OROMUCOSAL | Status: AC

## 2020-11-19 MED ORDER — BUPIVACAINE HCL (PF) 0.5 % IJ SOLN
INTRAMUSCULAR | Status: AC
  Filled 2020-11-19: qty 30

## 2020-11-19 MED ORDER — ONDANSETRON HCL 4 MG/2ML IJ SOLN: 4.0000 mg | Freq: Once | INTRAMUSCULAR | Status: DC | PRN

## 2020-11-19 SURGICAL SUPPLY — 50 items
ADH SKN CLS APL DERMABOND .7 (GAUZE/BANDAGES/DRESSINGS) ×1
APL PRP STRL LF DISP 70% ISPRP (MISCELLANEOUS) ×1
BINDER BREAST LRG (GAUZE/BANDAGES/DRESSINGS) IMPLANT
BINDER BREAST MEDIUM (GAUZE/BANDAGES/DRESSINGS) IMPLANT
BINDER BREAST XLRG (GAUZE/BANDAGES/DRESSINGS) IMPLANT
BINDER BREAST XXLRG (GAUZE/BANDAGES/DRESSINGS) IMPLANT
BLADE SURG 15 STRL LF DISP TIS (BLADE) ×2 IMPLANT
BLADE SURG 15 STRL SS (BLADE) ×4
CANISTER SUCT 1200ML W/VALVE (MISCELLANEOUS) IMPLANT
CHLORAPREP W/TINT 26 (MISCELLANEOUS) ×2 IMPLANT
CNTNR SPEC 2.5X3XGRAD LEK (MISCELLANEOUS)
CONT SPEC 4OZ STER OR WHT (MISCELLANEOUS)
CONT SPEC 4OZ STRL OR WHT (MISCELLANEOUS)
CONTAINER SPEC 2.5X3XGRAD LEK (MISCELLANEOUS) IMPLANT
COVER WAND RF STERILE (DRAPES) IMPLANT
DERMABOND ADVANCED (GAUZE/BANDAGES/DRESSINGS) ×1
DERMABOND ADVANCED .7 DNX12 (GAUZE/BANDAGES/DRESSINGS) ×1 IMPLANT
DEVICE DUBIN SPECIMEN MAMMOGRA (MISCELLANEOUS) ×2 IMPLANT
DRAPE LAPAROTOMY TRNSV 106X77 (MISCELLANEOUS) ×2 IMPLANT
DRSG GAUZE FLUFF 36X18 (GAUZE/BANDAGES/DRESSINGS) IMPLANT
ELECT CAUTERY BLADE 6.4 (BLADE) ×2 IMPLANT
ELECT REM PT RETURN 9FT ADLT (ELECTROSURGICAL) ×2
ELECTRODE REM PT RTRN 9FT ADLT (ELECTROSURGICAL) ×1 IMPLANT
GLOVE SURG ENC MOIS LTX SZ6.5 (GLOVE) ×2 IMPLANT
GLOVE SURG UNDER POLY LF SZ6.5 (GLOVE) ×2 IMPLANT
GOWN STRL REUS W/ TWL LRG LVL3 (GOWN DISPOSABLE) ×2 IMPLANT
GOWN STRL REUS W/TWL LRG LVL3 (GOWN DISPOSABLE) ×4
KIT MARKER MARGIN INK (KITS) ×2 IMPLANT
KIT TURNOVER KIT A (KITS) ×2 IMPLANT
LABEL OR SOLS (LABEL) ×2 IMPLANT
MANIFOLD NEPTUNE II (INSTRUMENTS) ×2 IMPLANT
MARGIN MAP 10MM (MISCELLANEOUS) IMPLANT
MARKER MARGIN CORRECT CLIP (MARKER) ×2 IMPLANT
NEEDLE HYPO 22GX1.5 SAFETY (NEEDLE) ×2 IMPLANT
NEEDLE HYPO 25X1 1.5 SAFETY (NEEDLE) ×2 IMPLANT
PACK BASIN MINOR ARMC (MISCELLANEOUS) ×2 IMPLANT
RETRACTOR RING XSMALL (MISCELLANEOUS) ×1 IMPLANT
RTRCTR WOUND ALEXIS 13CM XS SH (MISCELLANEOUS) ×2
SLEVE PROBE SENORX GAMMA FIND (MISCELLANEOUS) ×2 IMPLANT
SUT ETHILON 3-0 FS-10 30 BLK (SUTURE) ×2
SUT MNCRL 4-0 (SUTURE) ×4
SUT MNCRL 4-0 27XMFL (SUTURE) ×2
SUT SILK 2 0 SH (SUTURE) ×2 IMPLANT
SUT VIC AB 3-0 SH 27 (SUTURE) ×4
SUT VIC AB 3-0 SH 27X BRD (SUTURE) ×2 IMPLANT
SUTURE EHLN 3-0 FS-10 30 BLK (SUTURE) ×1 IMPLANT
SUTURE MNCRL 4-0 27XMF (SUTURE) ×2 IMPLANT
SYR 10ML LL (SYRINGE) ×4 IMPLANT
SYR TOOMEY 50ML (SYRINGE) ×2 IMPLANT
WATER STERILE IRR 1000ML POUR (IV SOLUTION) ×2 IMPLANT

## 2020-11-19 NOTE — Op Note (Signed)
Preoperative diagnosis: Left breast carcinoma.  Postoperative diagnosis: Left breast carcinoma.   Procedure: Left needle guided partial mastectomy.                       Left Axillary Sentinel Lymph node biopsy  Anesthesia: GETA  Surgeon: Dr. Windell Moment  Wound Classification: Clean  Indications: Patient is a 75 y.o. female with a nonpalpable left breast mass noted on mammography with core biopsy demonstrating ductal carcinoma in situ with suspected microinvasion requires bracket needle guided localized partial mastectomy for treatment with sentinel lymph node biopsy.   Findings: 1. Specimen mammography shows marker and needle on specimen 2. Pathology call refers gross examination of margins was clear 3. No other palpable mass or lymph node identified.   Description of procedure: Preoperative needle localization was performed by radiology. In the nuclear medicine suite, the subareolar region was injected with Tc-99 sulfur colloid. Localization studies were reviewed. The patient was taken to the operating room and placed supine on the operating table, and after general anesthesia the left chest and axilla were prepped and draped in the usual sterile fashion. A time-out was completed verifying correct patient, procedure, site, positioning, and implant(s) and/or special equipment prior to beginning this procedure.  By comparing the localization studies and trajectory of the wires, the probable trajectory and location of the mass was visualized. A circumareolar skin incision was planned in such a way as to minimize the amount of dissection to reach the mass.  The skin incision was made. Flaps were raised. A 2-0 silk figure-of-eight stay suture was placed and used for retraction. Dissection was then taken down circumferentially, taking care to include the entire wire and a wide margin of grossly normal tissue. The specimen and entire localizing wires were removed. The specimen was oriented and  sent to radiology with the localization studies. Confirmation was received that the entire target lesion had been resected. The wound was irrigated. Hemostasis was checked. The wound was closed with interrupted sutures of 3-0 Vicryl and a subcuticular suture of Monocryl 3-0. No attempt was made to close the dead space.   A hand-held gamma probe was used to identify the location of the hottest spot in the axilla. An incision was made around the caudal axillary hairline. Dissection was carried down until subdermal facias was advanced. The probe was placed and again, the point of maximal count was found. Dissection continue until nodule was identified. The probe was placed in contact with the node. The node was excised in its entirety. No additional hot spots were identified. No clinically abnormal nodes were palpated. The procedure was terminated. Hemostasis was achieved and the wound closed in layers with deep interrupted 3-0 Vicryl and skin was closed with subcuticular suture of Monocryl 3-0.  The patient tolerated the procedure well and was taken to the postanesthesia care unit in stable condition.   Sentinel Node Biopsy Synoptic Operative Report  Operation performed with curative intent:Yes  Tracer(s) used to identify sentinel nodes in the upfront surgery (non-neoadjuvant) setting (select all that apply):Radioactive Tracer  Tracer(s) used to identify sentinel nodes in the neoadjuvant setting (select all that apply):N/A  All nodes (colored or non-colored) present at the end of a dye-filled lymphatic channel were removed:N/A  All significantly radioactive nodes were removed:Yes  All palpable suspicious nodes were removed:N/A  Biopsy-proven positive nodes marked with clips prior to chemotherapy were identified and removed:N/A  Specimen: Left Breast mass  Sentinel Lymph node  Complications: None  Estimated Blood Loss: 10 mL

## 2020-11-19 NOTE — Anesthesia Preprocedure Evaluation (Addendum)
Anesthesia Evaluation  Patient identified by MRN, date of birth, ID band Patient awake    Reviewed: Allergy & Precautions, NPO status , Patient's Chart, lab work & pertinent test results  History of Anesthesia Complications Negative for: history of anesthetic complications  Airway Mallampati: III       Dental   Pulmonary neg sleep apnea, neg COPD, Not current smoker,           Cardiovascular hypertension, Pt. on medications and Pt. on home beta blockers + dysrhythmias (palpitations )      Neuro/Psych neg Seizures    GI/Hepatic Neg liver ROS, neg GERD  ,  Endo/Other  neg diabetesMorbid obesity  Renal/GU negative Renal ROS     Musculoskeletal   Abdominal (+) + obese,   Peds  Hematology   Anesthesia Other Findings   Reproductive/Obstetrics                            Anesthesia Physical Anesthesia Plan  ASA: III  Anesthesia Plan:    Post-op Pain Management:    Induction:   PONV Risk Score and Plan: 2 and Ondansetron and Dexamethasone  Airway Management Planned: Oral ETT  Additional Equipment:   Intra-op Plan:   Post-operative Plan:   Informed Consent: I have reviewed the patients History and Physical, chart, labs and discussed the procedure including the risks, benefits and alternatives for the proposed anesthesia with the patient or authorized representative who has indicated his/her understanding and acceptance.       Plan Discussed with:   Anesthesia Plan Comments:         Anesthesia Quick Evaluation

## 2020-11-19 NOTE — Progress Notes (Signed)
Excoriation under left axilla.  Pt stated this occurred in mammogram area.  Applied interdry per Dr. Peyton Najjar for comfort

## 2020-11-19 NOTE — Anesthesia Postprocedure Evaluation (Signed)
Anesthesia Post Note  Patient: Kelly Howell  Procedure(s) Performed: PARTIAL MASTECTOMY WITH NEEDLE LOCALIZATION AND AXILLARY SENTINEL LYMPH NODE BX (Left Breast)  Patient location during evaluation: PACU Anesthesia Type: General Level of consciousness: awake and alert Pain management: pain level controlled Vital Signs Assessment: post-procedure vital signs reviewed and stable Respiratory status: spontaneous breathing, nonlabored ventilation, respiratory function stable and patient connected to nasal cannula oxygen Cardiovascular status: blood pressure returned to baseline and stable Postop Assessment: no apparent nausea or vomiting Anesthetic complications: no   No complications documented.   Last Vitals:  Vitals:   11/19/20 1721 11/19/20 1745  BP: (!) 153/81 (!) 144/60  Pulse: 93 87  Resp: (!) 23 19  Temp: (!) 36.2 C   SpO2: 95% 96%    Last Pain:  Vitals:   11/19/20 1721  PainSc: Maud Enda Santo

## 2020-11-19 NOTE — Anesthesia Procedure Notes (Signed)
Procedure Name: LMA Insertion Date/Time: 11/19/2020 3:20 PM Performed by: Philbert Riser, CRNA Pre-anesthesia Checklist: Patient identified, Emergency Drugs available, Suction available, Patient being monitored and Timeout performed Patient Re-evaluated:Patient Re-evaluated prior to induction Oxygen Delivery Method: Circle system utilized Preoxygenation: Pre-oxygenation with 100% oxygen Induction Type: IV induction Ventilation: Mask ventilation without difficulty LMA: LMA inserted LMA Size: 4.0 Number of attempts: 1 Placement Confirmation: positive ETCO2 Tube secured with: Tape

## 2020-11-19 NOTE — Interval H&P Note (Signed)
History and Physical Interval Note:  11/19/2020 2:41 PM  Kelly Howell  has presented today for surgery, with the diagnosis of C50.412, Z17.0 Maliganant neoplasm of upper-outer quadrant of lt female breast, estrogen receptor positive.  The various methods of treatment have been discussed with the patient and family. After consideration of risks, benefits and other options for treatment, the patient has consented to  Procedure(s): PARTIAL MASTECTOMY WITH NEEDLE LOCALIZATION AND AXILLARY SENTINEL LYMPH NODE BX (Left) as a surgical intervention.  The patient's history has been reviewed, patient examined, no change in status, stable for surgery.  I have reviewed the patient's chart and labs.  Left breast marked in the pre procedure room. Questions were answered to the patient's satisfaction.     Herbert Pun

## 2020-11-19 NOTE — Transfer of Care (Signed)
Immediate Anesthesia Transfer of Care Note  Patient: Kelly Howell  Procedure(s) Performed: PARTIAL MASTECTOMY WITH NEEDLE LOCALIZATION AND AXILLARY SENTINEL LYMPH NODE BX (Left Breast)  Patient Location: PACU  Anesthesia Type:General  Level of Consciousness: sedated  Airway & Oxygen Therapy: Patient Spontanous Breathing and Patient connected to nasal cannula oxygen  Post-op Assessment: Report given to RN and Post -op Vital signs reviewed and stable  Post vital signs: Reviewed and stable  Last Vitals:  Vitals Value Taken Time  BP 126/71 11/19/20 1639  Temp    Pulse 87 11/19/20 1643  Resp 17 11/19/20 1643  SpO2 97 % 11/19/20 1643  Vitals shown include unvalidated device data.  Last Pain: There were no vitals filed for this visit.       Complications: No complications documented.

## 2020-11-19 NOTE — Discharge Instructions (Signed)

## 2020-11-20 ENCOUNTER — Encounter: Payer: Self-pay | Admitting: General Surgery

## 2020-11-21 NOTE — Progress Notes (Signed)
Patient ID: Kelly Howell, female   DOB: June 14, 1946, 75 y.o.   MRN: [id number] Patient doing well post-op.  States underarm excoriation still sore, but better.  Incision site pink,.  Instructed to place soft material under arm between bra, to prevent rubbing.

## 2020-11-24 ENCOUNTER — Other Ambulatory Visit: Payer: Self-pay | Admitting: Anatomic Pathology & Clinical Pathology

## 2020-11-24 LAB — SURGICAL PATHOLOGY

## 2020-12-03 ENCOUNTER — Inpatient Hospital Stay (HOSPITAL_BASED_OUTPATIENT_CLINIC_OR_DEPARTMENT_OTHER): Payer: Medicare Other | Admitting: Internal Medicine

## 2020-12-03 ENCOUNTER — Other Ambulatory Visit: Payer: Self-pay

## 2020-12-03 DIAGNOSIS — D0512 Intraductal carcinoma in situ of left breast: Secondary | ICD-10-CM

## 2020-12-03 NOTE — Assessment & Plan Note (Addendum)
#  Left breast DCIS ER positive s/p Lumpectomy; sentinel lymph node biopsy negative for malignancy.   #I reviewed the pathology with the patient in detail.  Recommend adjuvant radiation.  Discussed the radiation schedule-Monday through Friday [5 times a week]; possible radiation side effects including skin rash/fatigue; otherwise well-tolerated.  We will make a referral to Dr. Donella Stade.  #Discussed the role of aromatase inhibitor-anastrozole/Aromasin "chemoprophylaxis.".  Start after radiation.  GENETICS: Family history of breast cancer: mat granda ma- breast cancer; mother-colon cancer; mat aunt- breast cancer; aunts daughters- breast cancer. Genetics at next visit  # DISPOSITION: # refer to Dr.Crystal re: DCIS # follow in 2 months; MD; no labs--Dr.B

## 2020-12-03 NOTE — Progress Notes (Signed)
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  - ASSOCIATED ATYPICAL LOBULAR HYPERPLASIA.  - TWO CLIPS AND TWO BIOPSY SITES IDENTIFIED.  - NO EVIDENCE OF INVASIVE CARCINOMA.  - SEE CANCER SUMMARY.   B. LYMPH NODE, LEFT AXILLARY SENTINEL; EXCISION:  - ONE LYMPH NODE, NEGATIVE FOR MALIGNANCY (0/1).    # SURVIVORSHIP:   # GENETICS:   DIAGNOSIS:   STAGE:         ;  GOALS:  CURRENT/MOST RECENT THERAPY :     Ductal carcinoma in situ (DCIS) of left breast  11/09/2020 Initial Diagnosis   Ductal carcinoma in situ (DCIS) of left breast      HISTORY OF PRESENTING ILLNESS:  Kelly Howell 75 y.o.  female DCIS is here is here for follow-up.  Patient underwent lumpectomy.  Postoperatively recovering well.  Denies any unusual pain or skin rash.   Patient states she was found to have an abnormal screening mammogram in dec 2021 which led to diagnostic mammogram/ultrasound/followed by biopsy-as summarized above.  Of note patient had wide local resection/lumpectomy in 2005.  As per patient at the time pathology even reviewed at [facility] were not conclusive for hyperplasia versus early malignancy.  Patient finished tamoxifen for 5 years.  Review of Systems  Constitutional: Negative for chills, diaphoresis, fever, malaise/fatigue and weight loss.  HENT: Negative for nosebleeds and sore throat.   Eyes: Negative for double vision.  Respiratory: Negative for cough,  hemoptysis, sputum production, shortness of breath and wheezing.   Cardiovascular: Negative for chest pain, palpitations, orthopnea and leg swelling.  Gastrointestinal: Negative for abdominal pain, blood in stool, constipation, diarrhea, heartburn, melena, nausea and vomiting.  Genitourinary: Negative for dysuria, frequency and urgency.  Musculoskeletal: Positive for joint pain. Negative for back pain.  Skin: Negative.  Negative for itching and rash.  Neurological: Negative for dizziness, tingling, focal weakness, weakness and headaches.  Endo/Heme/Allergies: Does not bruise/bleed easily.  Psychiatric/Behavioral: Negative for depression. The patient is not nervous/anxious and does not have insomnia.      MEDICAL HISTORY:  Past Medical History:  Diagnosis Date  . Abnormal mammogram   . Atypical hyperplasia of breast    Left  . Breast cancer ([place]) 2000   left lumpectomy  . Colon polyps   . Dumping syndrome   . Dysrhythmia    pvc  . Hyperlipidemia   . Hypertension   . IBS (irritable bowel syndrome)   . Intermittent constipation   . Intermittent diarrhea   . Left sided lacunar infarction ([place])   . OA (osteoarthritis)   . Obesity   . Torus palatinus     SURGICAL HISTORY: Past Surgical History:  Procedure Laterality Date  . BREAST BIOPSY Left 2000   Positive  . BREAST BIOPSY Left 11/03/2020   affirm bx ,  posterior group coil marker, DCIS NUCLEAR GRADE 2 WITH COMEDONECROSIS AND ASSOCIATED CALCIFICATIONS  ATYPICAL LOBULAR HYPERPLASIA  . BREAST BIOPSY Left 11/03/2020   affirm bx, anterior group x marker, DCIS NUCLEAR GRADE 2 WITH COMEDONECROSIS AND  ASSOCIATED CALCIFICATIONS  ATYPICAL LOBULAR HYPERPLASIA  . BREAST LUMPECTOMY Left 2000   positive for breast ca  . BREAST LUMPECTOMY Left 11/19/2020   NL with SN DCIS  . CESAREAN SECTION    . CHOLECYSTECTOMY    . COLONOSCOPY    . COLONOSCOPY WITH PROPOFOL N/A 07/04/2016   Procedure: COLONOSCOPY WITH PROPOFOL;  Surgeon: Martin U  Skulskie, MD;  Location: [facility] ENDOSCOPY;  Service: Endoscopy;  Laterality: N/A;  . Left Wrist Biopsy    . PARTIAL MASTECTOMY WITH NEEDLE LOCALIZATION AND AXILLARY SENTINEL LYMPH NODE BX Left 11/19/2020   Procedure: PARTIAL MASTECTOMY WITH NEEDLE LOCALIZATION AND AXILLARY SENTINEL LYMPH NODE BX;  Surgeon: Cintron-Diaz, Edgardo, MD;  Location: [facility] ORS;  Service: General;  Laterality: Left;    SOCIAL HISTORY: Social History   Socioeconomic History  . Marital status: Married    Spouse name: Not on file  . Number of children: Not on file  . Years of education: Not on file  . Highest education level: Not on file  Occupational History  . Not on file  Tobacco Use  . Smoking status: Never Smoker  . Smokeless tobacco: Never Used  Vaping Use  . Vaping Use: Never used  Substance and Sexual Activity  . Alcohol use: Never  . Drug use: Never  . Sexual activity: Not on file  Other Topics Concern  . Not on file  Social History Narrative   Lives in [place]. 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    Social Determinants of Health   Financial Resource Strain: Not on file  Food Insecurity: Not on file  Transportation Needs: Not on file  Physical Activity: Not on file  Stress: Not on file  Social Connections: Not on file  Intimate Partner Violence: Not on file    FAMILY HISTORY: Family History  Problem Relation Age of Onset  . Colon cancer Mother   . Breast cancer Maternal Aunt 65  . Breast cancer Paternal Grandmother 60    ALLERGIES:  is allergic to gantrisin [sulfisoxazole], sulfa antibiotics, and tape.  MEDICATIONS:  Current Outpatient Medications  Medication Sig Dispense Refill  . aspirin 81 MG chewable tablet Chew 81 mg by mouth daily.    . atorvastatin (LIPITOR) 20 MG tablet Take 20 mg by mouth at bedtime.    . Coenzyme Q10 100 MG capsule Take 100 mg by mouth daily.    . fexofenadine  (ALLEGRA) 180 MG tablet Take 180 mg by mouth daily as needed for allergies or rhinitis.    . fluticasone (FLONASE) 50 MCG/ACT nasal spray Place 2 sprays into both nostrils daily as needed for rhinitis.    . Hylan 16 MG/2ML SOSY Inject 16 mcg into the articular space every 6 (six) months. Synvisc    . lisinopril (PRINIVIL,ZESTRIL) 20 MG tablet Take 20 mg by mouth daily.    . metoprolol tartrate (LOPRESSOR) 25 MG tablet Take 25 mg by mouth 2 (two) times daily.    . naproxen (NAPROSYN) 500 MG tablet Take 500 mg by mouth 2 (two) times daily with a meal.    . nystatin cream (MYCOSTATIN) Apply 1 application topically daily as needed (Yeast).    . VITAMIN B1-B12 IM Inject 1,000 mcg into the muscle every 30 (thirty) days.    . Vitamin D3 (VITAMIN D) 25 MCG tablet Take 1,000 Units by mouth daily.     No current facility-administered medications for   this visit.      .  PHYSICAL EXAMINATION: ECOG PERFORMANCE STATUS: 0 - Asymptomatic  Vitals:   12/03/20 1100  BP: 137/61  Pulse: (!) 59  Resp: 18  Temp: (!) 96.4 F (35.8 C)   Filed Weights   12/03/20 1100  Weight: 242 lb 1.6 oz (109.8 kg)    Physical Exam HENT:     Head: Normocephalic and atraumatic.     Mouth/Throat:     Pharynx: No oropharyngeal exudate.  Eyes:     Pupils: Pupils are equal, round, and reactive to light.  Cardiovascular:     Rate and Rhythm: Normal rate and regular rhythm.  Pulmonary:     Effort: Pulmonary effort is normal. No respiratory distress.     Breath sounds: Normal breath sounds. No wheezing.  Abdominal:     General: Bowel sounds are normal. There is no distension.     Palpations: Abdomen is soft. There is no mass.     Tenderness: There is no abdominal tenderness. There is no guarding or rebound.  Musculoskeletal:        General: No tenderness. Normal range of motion.     Cervical back: Normal range of motion and neck supple.  Skin:    General: Skin is warm.  Neurological:     Mental Status: She is  alert and oriented to person, place, and time.  Psychiatric:        Mood and Affect: Affect normal.      LABORATORY DATA:  I have reviewed the data as listed No results found for: WBC, HGB, HCT, MCV, PLT No results for input(s): NA, K, CL, CO2, GLUCOSE, BUN, CREATININE, CALCIUM, GFRNONAA, GFRAA, PROT, ALBUMIN, AST, ALT, ALKPHOS, BILITOT, BILIDIR, IBILI in the last 8760 hours.  RADIOGRAPHIC STUDIES: I have personally reviewed the radiological images as listed and agreed with the findings in the report. NM Sentinel Node Inj-No Rpt (Breast)  Result Date: 11/19/2020 Sulfur colloid was injected by the nuclear medicine technologist for melanoma sentinel node.   MM Breast Surgical Specimen  Result Date: 11/19/2020 CLINICAL DATA:  Status post excisional biopsy of the left breast. EXAM: SPECIMEN RADIOGRAPH OF THE LEFT BREAST COMPARISON:  Previous exam(s). FINDINGS: Status post excision of the left breast. The wire tips and biopsy marker clips are present and are marked for pathology. IMPRESSION: Specimen radiograph of the left breast. Electronically Signed   By: Dina  Arceo M.D.   On: 11/19/2020 15:56   MM LT PLC BREAST LOC DEV   1ST LESION  INC MAMMO GUIDE  Result Date: 11/19/2020 CLINICAL DATA:  Biopsy proven ductal carcinoma in-situ in the left breast. Bracketed needle localization requested. EXAM: NEEDLE LOCALIZATIONS OF THE LEFT BREAST WITH MAMMO GUIDANCE COMPARISON:  Previous exams. PROCEDURE: Patient presents for needle localization prior to surgery. I met with the patient and we discussed the procedure of needle localization including benefits and alternatives. We discussed the high likelihood of a successful procedure. We discussed the risks of the procedure, including infection, bleeding, tissue injury, and further surgery. Informed, written consent was given. The usual time-out protocol was performed immediately prior to the procedure. Using mammographic guidance, sterile technique, 1%  lidocaine and a #7 modified Kopans needle, the coil shaped clip was localized using lateral to medial approach. The images were marked for Dr. Cintron-Diaz. Using mammographic guidance, sterile technique, 1% lidocaine and a #5 modified Kopans needle, X shaped clip was localized using lateral to me approach. The images were marked for Dr. Cintron-Diaz. IMPRESSION: Bracketed   needle localization of the left breast. No apparent complications. Electronically Signed   By: Lillia Mountain M.D.   On: 11/19/2020 09:01   MM LT PLC BREAST LOC DEV   EA ADD LESION  INC MAMMO GUIDE  Result Date: 11/19/2020 CLINICAL DATA:  Biopsy proven ductal carcinoma in-situ in the left breast. Bracketed needle localization requested. EXAM: NEEDLE LOCALIZATIONS OF THE LEFT BREAST WITH MAMMO GUIDANCE COMPARISON:  Previous exams. PROCEDURE: Patient presents for needle localization prior to surgery. I met with the patient and we discussed the procedure of needle localization including benefits and alternatives. We discussed the high likelihood of a successful procedure. We discussed the risks of the procedure, including infection, bleeding, tissue injury, and further surgery. Informed, written consent was given. The usual time-out protocol was performed immediately prior to the procedure. Using mammographic guidance, sterile technique, 1% lidocaine and a #7 modified Kopans needle, the coil shaped clip was localized using lateral to medial approach. The images were marked for Dr. Windell Moment. Using mammographic guidance, sterile technique, 1% lidocaine and a #5 modified Kopans needle, X shaped clip was localized using lateral to me approach. The images were marked for Dr. Windell Moment. IMPRESSION: Bracketed needle localization of the left breast. No apparent complications. Electronically Signed   By: Lillia Mountain M.D.   On: 11/19/2020 09:01    ASSESSMENT & PLAN:   Ductal carcinoma in situ (DCIS) of left breast #Left breast DCIS ER positive  s/p Lumpectomy; sentinel lymph node biopsy negative for malignancy.   #I reviewed the pathology with the patient in detail.  Recommend adjuvant radiation.  Discussed the radiation schedule-Monday through Friday [5 times a week]; possible radiation side effects including skin rash/fatigue; otherwise well-tolerated.  We will make a referral to Dr. Donella Stade.  #Discussed the role of aromatase inhibitor-anastrozole/Aromasin "chemoprophylaxis.".  Start after radiation.  GENETICS: Family history of breast cancer: mat granda ma- breast cancer; mother-colon cancer; mat aunt- breast cancer; aunts daughters- breast cancer. Genetics at next visit  # DISPOSITION: # refer to Dr.Crystal re: DCIS # follow in 2 months; MD; no labs--Dr.B   All questions were answered. The patient/family knows to call the clinic with any problems, questions or concerns.   Cammie Sickle, MD 12/03/2020 11:32 AM

## 2020-12-08 ENCOUNTER — Encounter: Payer: Self-pay | Admitting: Radiation Oncology

## 2020-12-08 ENCOUNTER — Ambulatory Visit
Admission: RE | Admit: 2020-12-08 | Discharge: 2020-12-08 | Disposition: A | Discharge status: Home / Self Care | Payer: Medicare Other | Source: Ambulatory Visit | Attending: Radiation Oncology | Admitting: Radiation Oncology

## 2020-12-08 VITALS — BP 141/73 | HR 68 | Temp 97.4°F | Resp 18 | Wt 244.7 lb

## 2020-12-08 DIAGNOSIS — E785 Hyperlipidemia, unspecified: Secondary | ICD-10-CM | POA: Insufficient documentation

## 2020-12-08 DIAGNOSIS — Z7982 Long term (current) use of aspirin: Secondary | ICD-10-CM | POA: Diagnosis not present

## 2020-12-08 DIAGNOSIS — Z803 Family history of malignant neoplasm of breast: Secondary | ICD-10-CM | POA: Insufficient documentation

## 2020-12-08 DIAGNOSIS — D0512 Intraductal carcinoma in situ of left breast: Secondary | ICD-10-CM | POA: Diagnosis not present

## 2020-12-08 DIAGNOSIS — I10 Essential (primary) hypertension: Secondary | ICD-10-CM | POA: Insufficient documentation

## 2020-12-08 DIAGNOSIS — Z79899 Other long term (current) drug therapy: Secondary | ICD-10-CM | POA: Diagnosis not present

## 2020-12-08 DIAGNOSIS — Z170 Estrogen receptor positive status [ER+]: Secondary | ICD-10-CM | POA: Diagnosis not present

## 2020-12-08 DIAGNOSIS — K589 Irritable bowel syndrome without diarrhea: Secondary | ICD-10-CM | POA: Diagnosis not present

## 2020-12-08 DIAGNOSIS — I499 Cardiac arrhythmia, unspecified: Secondary | ICD-10-CM | POA: Insufficient documentation

## 2020-12-08 DIAGNOSIS — R197 Diarrhea, unspecified: Secondary | ICD-10-CM | POA: Diagnosis not present

## 2020-12-08 DIAGNOSIS — Z86010 Personal history of colonic polyps: Secondary | ICD-10-CM | POA: Insufficient documentation

## 2020-12-08 DIAGNOSIS — Z800 Family history of malignant neoplasm of digestive organs: Secondary | ICD-10-CM | POA: Insufficient documentation

## 2020-12-08 DIAGNOSIS — K5900 Constipation, unspecified: Secondary | ICD-10-CM | POA: Diagnosis not present

## 2020-12-08 NOTE — Consult Note (Signed)
NEW PATIENT EVALUATION  Name: Kelly Howell  MRN: [id number]  Date:   12/08/2020     DOB: [birth year]   This 75 y.o. female patient presents to the clinic for initial evaluation of ductal carcinoma in situ stage 0 (Tis N0 M0) status post wide local excision of the left breast in 75 year old female.  REFERRING PHYSICIAN: Maryland Pink, MD  CHIEF COMPLAINT:  Chief Complaint  Patient presents with  . DCIS    Initial consultation    DIAGNOSIS: The encounter diagnosis was Ductal carcinoma in situ (DCIS) of left breast.   PREVIOUS INVESTIGATIONS:  Mammogram and ultrasound reviewed Pathology report reviewed Clinical notes reviewed  HPI: Patient is a 75 year old female who presented with an abnormal mammogram of her left breast.  Initial Ephriam Jenkins showed indeterminate breast calcifications spanning 3.2 cm.  This was targeted by ultrasound guidance showing ductal carcinoma in situ.  She underwent a wide local excision and sentinel node biopsy showing at least 2.9 cm of ductal carcinoma in situ nuclear grade 2 with necrosis present.  Margins were negative but close at 2 mm.  1 sentinel node was negative for metastatic disease.  Tumor was ER positive.  Patient had some loss of skin and near her axillary incision although that is well-healed she specifically denies breast tenderness cough or bone pain.  She is now seen for radiation oncology consultation.  PLANNED TREATMENT REGIMEN: Left hypofractionated whole breast radiation  PAST MEDICAL HISTORY:  has a past medical history of Abnormal mammogram, Atypical hyperplasia of breast, Breast cancer ([place]) (2000), Colon polyps, Dumping syndrome, Dysrhythmia, Hyperlipidemia, Hypertension, IBS (irritable bowel syndrome), Intermittent constipation, Intermittent diarrhea, Left sided lacunar infarction ([place]), OA (osteoarthritis), Obesity, and Torus palatinus.    PAST SURGICAL HISTORY:  Past Surgical History:  Procedure Laterality Date  . BREAST BIOPSY Left 2000    Positive  . BREAST BIOPSY Left 11/03/2020   affirm bx ,  posterior group coil marker, DCIS NUCLEAR GRADE 2 WITH COMEDONECROSIS AND ASSOCIATED CALCIFICATIONS  ATYPICAL LOBULAR HYPERPLASIA  . BREAST BIOPSY Left 11/03/2020   affirm bx, anterior group x marker, DCIS NUCLEAR GRADE 2 WITH COMEDONECROSIS AND ASSOCIATED CALCIFICATIONS  ATYPICAL LOBULAR HYPERPLASIA  . BREAST LUMPECTOMY Left 2000   positive for breast ca  . BREAST LUMPECTOMY Left 11/19/2020   NL with SN DCIS  . CESAREAN SECTION    . CHOLECYSTECTOMY    . COLONOSCOPY    . COLONOSCOPY WITH PROPOFOL N/A 07/04/2016   Procedure: COLONOSCOPY WITH PROPOFOL;  Surgeon: Lollie Sails, MD;  Location: [facility] ENDOSCOPY;  Service: Endoscopy;  Laterality: N/A;  . Left Wrist Biopsy    . PARTIAL MASTECTOMY WITH NEEDLE LOCALIZATION AND AXILLARY SENTINEL LYMPH NODE BX Left 11/19/2020   Procedure: PARTIAL MASTECTOMY WITH NEEDLE LOCALIZATION AND AXILLARY SENTINEL LYMPH NODE BX;  Surgeon: Herbert Pun, MD;  Location: [facility] ORS;  Service: General;  Laterality: Left;    FAMILY HISTORY: family history includes Breast cancer (age of onset: 38) in her paternal grandmother; Breast cancer (age of onset: 75) in her maternal aunt; Colon cancer in her mother.  SOCIAL HISTORY:  reports that she has never smoked. She has never used smokeless tobacco. She reports that she does not drink alcohol and does not use drugs.  ALLERGIES: Gantrisin [sulfisoxazole], Sulfa antibiotics, and Tape  MEDICATIONS:  Current Outpatient Medications  Medication Sig Dispense Refill  . aspirin 81 MG chewable tablet Chew 81 mg by mouth daily.    Marland Kitchen atorvastatin (LIPITOR) 20 MG tablet Take 20 mg by mouth  at bedtime.    . Coenzyme Q10 100 MG capsule Take 100 mg by mouth daily.    . fexofenadine (ALLEGRA) 180 MG tablet Take 180 mg by mouth daily as needed for allergies or rhinitis.    . fluticasone (FLONASE) 50 MCG/ACT nasal spray Place 2 sprays into both nostrils daily as needed  for rhinitis.    . Hylan 16 MG/2ML SOSY Inject 16 mcg into the articular space every 6 (six) months. Synvisc    . lisinopril (PRINIVIL,ZESTRIL) 20 MG tablet Take 20 mg by mouth daily.    . metoprolol tartrate (LOPRESSOR) 25 MG tablet Take 25 mg by mouth 2 (two) times daily.    . naproxen (NAPROSYN) 500 MG tablet Take 500 mg by mouth 2 (two) times daily with a meal.    . nystatin cream (MYCOSTATIN) Apply 1 application topically daily as needed (Yeast).    Marland Kitchen VITAMIN B1-B12 IM Inject 1,000 mcg into the muscle every 30 (thirty) days.    . Vitamin D3 (VITAMIN D) 25 MCG tablet Take 1,000 Units by mouth daily.     No current facility-administered medications for this encounter.    ECOG PERFORMANCE STATUS:  0 - Asymptomatic  REVIEW OF SYSTEMS: Patient denies any weight loss, fatigue, weakness, fever, chills or night sweats. Patient denies any loss of vision, blurred vision. Patient denies any ringing  of the ears or hearing loss. No irregular heartbeat. Patient denies heart murmur or history of fainting. Patient denies any chest pain or pain radiating to her upper extremities. Patient denies any shortness of breath, difficulty breathing at night, cough or hemoptysis. Patient denies any swelling in the lower legs. Patient denies any nausea vomiting, vomiting of blood, or coffee ground material in the vomitus. Patient denies any stomach pain. Patient states has had normal bowel movements no significant constipation or diarrhea. Patient denies any dysuria, hematuria or significant nocturia. Patient denies any problems walking, swelling in the joints or loss of balance. Patient denies any skin changes, loss of hair or loss of weight. Patient denies any excessive worrying or anxiety or significant depression. Patient denies any problems with insomnia. Patient denies excessive thirst, polyuria, polydipsia. Patient denies any swollen glands, patient denies easy bruising or easy bleeding. Patient denies any recent  infections, allergies or URI. Patient "s visual fields have not changed significantly in recent time.   PHYSICAL EXAM: BP (!) 141/73 (BP Location: Right Arm)   Pulse 68   Temp (!) 97.4 F (36.3 C) (Tympanic)   Resp 18   Wt 244 lb 11.2 oz (111 kg)   BMI 42.00 kg/m  She status post wide local excision and sentinel node biopsy of her left breast both incisions are healing well.  No dominant masses noted in either breast no axillary or supraclavicular adenopathy is identified.  Well-developed well-nourished patient in NAD. HEENT reveals PERLA, EOMI, discs not visualized.  Oral cavity is clear. No oral mucosal lesions are identified. Neck is clear without evidence of cervical or supraclavicular adenopathy. Lungs are clear to A&P. Cardiac examination is essentially unremarkable with regular rate and rhythm without murmur rub or thrill. Abdomen is benign with no organomegaly or masses noted. Motor sensory and DTR levels are equal and symmetric in the upper and lower extremities. Cranial nerves II through XII are grossly intact. Proprioception is intact. No peripheral adenopathy or edema is identified. No motor or sensory levels are noted. Crude visual fields are within normal range.  LABORATORY DATA: Pathology report reviewed    RADIOLOGY  RESULTS: Mammogram and ultrasound reviewed compatible with above-stated findings   IMPRESSION: Stage 0 ER positive ductal carcinoma in situ of the left breast status post wide local excision and sentinel node biopsy in 75 year old female  PLAN: At this time like to go ahead with whole breast radiation.  We will treat hypofractionated course of treatment over 3 weeks boosting her scar another 1600 cGy using electron beam based on her close close to millimeter margin for DCIS.  Risks and benefits of treatment occluding skin reaction fatigue alteration of blood counts possible inclusion of superficial lung all were described in detail to the patient.  I personally set  her up for CT simulation early next week.  Patient also benefit from antiestrogen therapy after completion of radiation.  Patient comprehends my recommendations well.  I would like to take this opportunity to thank you for allowing me to participate in the care of your patient.Noreene Filbert, MD

## 2020-12-13 ENCOUNTER — Ambulatory Visit: Payer: Medicare Other

## 2020-12-16 ENCOUNTER — Ambulatory Visit
Admission: RE | Admit: 2020-12-16 | Discharge: 2020-12-16 | Disposition: A | Discharge status: Home / Self Care | Payer: Medicare Other | Source: Ambulatory Visit | Attending: Radiation Oncology | Admitting: Radiation Oncology

## 2020-12-16 ENCOUNTER — Other Ambulatory Visit: Payer: Self-pay | Admitting: *Deleted

## 2020-12-16 DIAGNOSIS — Z510 Encounter for antineoplastic radiation therapy: Secondary | ICD-10-CM | POA: Diagnosis not present

## 2020-12-16 DIAGNOSIS — D0512 Intraductal carcinoma in situ of left breast: Secondary | ICD-10-CM

## 2020-12-21 DIAGNOSIS — Z510 Encounter for antineoplastic radiation therapy: Secondary | ICD-10-CM | POA: Diagnosis not present

## 2020-12-23 ENCOUNTER — Ambulatory Visit: Admission: RE | Admit: 2020-12-23 | Payer: Medicare Other | Source: Ambulatory Visit

## 2020-12-23 DIAGNOSIS — Z510 Encounter for antineoplastic radiation therapy: Secondary | ICD-10-CM | POA: Diagnosis not present

## 2020-12-27 ENCOUNTER — Ambulatory Visit
Admission: RE | Admit: 2020-12-27 | Discharge: 2020-12-27 | Disposition: A | Discharge status: Home / Self Care | Payer: Medicare Other | Source: Ambulatory Visit | Attending: Radiation Oncology | Admitting: Radiation Oncology

## 2020-12-27 DIAGNOSIS — Z510 Encounter for antineoplastic radiation therapy: Secondary | ICD-10-CM | POA: Diagnosis not present

## 2020-12-28 ENCOUNTER — Ambulatory Visit
Admission: RE | Admit: 2020-12-28 | Discharge: 2020-12-28 | Disposition: A | Discharge status: Home / Self Care | Payer: Medicare Other | Source: Ambulatory Visit | Attending: Radiation Oncology | Admitting: Radiation Oncology

## 2020-12-28 DIAGNOSIS — Z510 Encounter for antineoplastic radiation therapy: Secondary | ICD-10-CM | POA: Diagnosis not present

## 2020-12-29 ENCOUNTER — Ambulatory Visit
Admission: RE | Admit: 2020-12-29 | Discharge: 2020-12-29 | Disposition: A | Discharge status: Home / Self Care | Payer: Medicare Other | Source: Ambulatory Visit | Attending: Radiation Oncology | Admitting: Radiation Oncology

## 2020-12-29 DIAGNOSIS — Z510 Encounter for antineoplastic radiation therapy: Secondary | ICD-10-CM | POA: Diagnosis not present

## 2020-12-30 ENCOUNTER — Ambulatory Visit
Admission: RE | Admit: 2020-12-30 | Discharge: 2020-12-30 | Disposition: A | Discharge status: Home / Self Care | Payer: Medicare Other | Source: Ambulatory Visit | Attending: Radiation Oncology | Admitting: Radiation Oncology

## 2020-12-30 DIAGNOSIS — Z510 Encounter for antineoplastic radiation therapy: Secondary | ICD-10-CM | POA: Diagnosis not present

## 2020-12-31 ENCOUNTER — Ambulatory Visit
Admission: RE | Admit: 2020-12-31 | Discharge: 2020-12-31 | Disposition: A | Discharge status: Home / Self Care | Payer: Medicare Other | Source: Ambulatory Visit | Attending: Radiation Oncology | Admitting: Radiation Oncology

## 2020-12-31 DIAGNOSIS — Z510 Encounter for antineoplastic radiation therapy: Secondary | ICD-10-CM | POA: Diagnosis not present

## 2021-01-03 ENCOUNTER — Ambulatory Visit
Admission: RE | Admit: 2021-01-03 | Discharge: 2021-01-03 | Disposition: A | Discharge status: Home / Self Care | Payer: Medicare Other | Source: Ambulatory Visit | Attending: Radiation Oncology | Admitting: Radiation Oncology

## 2021-01-03 DIAGNOSIS — Z510 Encounter for antineoplastic radiation therapy: Secondary | ICD-10-CM | POA: Diagnosis not present

## 2021-01-04 ENCOUNTER — Ambulatory Visit
Admission: RE | Admit: 2021-01-04 | Discharge: 2021-01-04 | Disposition: A | Discharge status: Home / Self Care | Payer: Medicare Other | Source: Ambulatory Visit | Attending: Radiation Oncology | Admitting: Radiation Oncology

## 2021-01-04 DIAGNOSIS — Z510 Encounter for antineoplastic radiation therapy: Secondary | ICD-10-CM | POA: Diagnosis not present

## 2021-01-05 ENCOUNTER — Ambulatory Visit
Admission: RE | Admit: 2021-01-05 | Discharge: 2021-01-05 | Disposition: A | Discharge status: Home / Self Care | Payer: Medicare Other | Source: Ambulatory Visit | Attending: Radiation Oncology | Admitting: Radiation Oncology

## 2021-01-05 DIAGNOSIS — Z510 Encounter for antineoplastic radiation therapy: Secondary | ICD-10-CM | POA: Diagnosis not present

## 2021-01-06 ENCOUNTER — Ambulatory Visit
Admission: RE | Admit: 2021-01-06 | Discharge: 2021-01-06 | Disposition: A | Discharge status: Home / Self Care | Payer: Medicare Other | Source: Ambulatory Visit | Attending: Radiation Oncology | Admitting: Radiation Oncology

## 2021-01-06 DIAGNOSIS — Z510 Encounter for antineoplastic radiation therapy: Secondary | ICD-10-CM | POA: Diagnosis not present

## 2021-01-07 ENCOUNTER — Inpatient Hospital Stay: Payer: Medicare Other | Attending: Internal Medicine

## 2021-01-07 ENCOUNTER — Other Ambulatory Visit: Payer: Self-pay

## 2021-01-07 ENCOUNTER — Ambulatory Visit
Admission: RE | Admit: 2021-01-07 | Discharge: 2021-01-07 | Disposition: A | Discharge status: Home / Self Care | Payer: Medicare Other | Source: Ambulatory Visit | Attending: Radiation Oncology | Admitting: Radiation Oncology

## 2021-01-07 DIAGNOSIS — Z510 Encounter for antineoplastic radiation therapy: Secondary | ICD-10-CM | POA: Diagnosis not present

## 2021-01-07 DIAGNOSIS — D0512 Intraductal carcinoma in situ of left breast: Secondary | ICD-10-CM | POA: Insufficient documentation

## 2021-01-07 LAB — CBC
HCT: 37.6 % (ref 36.0–46.0)
Hemoglobin: 12.6 g/dL (ref 12.0–15.0)
MCH: 32.1 pg (ref 26.0–34.0)
MCHC: 33.5 g/dL (ref 30.0–36.0)
MCV: 95.7 fL (ref 80.0–100.0)
Platelets: 199 10*3/uL (ref 150–400)
RBC: 3.93 MIL/uL (ref 3.87–5.11)
RDW: 12.4 % (ref 11.5–15.5)
WBC: 4.7 10*3/uL (ref 4.0–10.5)
nRBC: 0 % (ref 0.0–0.2)

## 2021-01-10 ENCOUNTER — Ambulatory Visit
Admission: RE | Admit: 2021-01-10 | Discharge: 2021-01-10 | Disposition: A | Discharge status: Home / Self Care | Payer: Medicare Other | Source: Ambulatory Visit | Attending: Radiation Oncology | Admitting: Radiation Oncology

## 2021-01-10 DIAGNOSIS — Z510 Encounter for antineoplastic radiation therapy: Secondary | ICD-10-CM | POA: Diagnosis not present

## 2021-01-11 ENCOUNTER — Ambulatory Visit
Admission: RE | Admit: 2021-01-11 | Discharge: 2021-01-11 | Disposition: A | Discharge status: Home / Self Care | Payer: Medicare Other | Source: Ambulatory Visit | Attending: Radiation Oncology | Admitting: Radiation Oncology

## 2021-01-11 DIAGNOSIS — Z510 Encounter for antineoplastic radiation therapy: Secondary | ICD-10-CM | POA: Diagnosis not present

## 2021-01-12 ENCOUNTER — Ambulatory Visit
Admission: RE | Admit: 2021-01-12 | Discharge: 2021-01-12 | Disposition: A | Discharge status: Home / Self Care | Payer: Medicare Other | Source: Ambulatory Visit | Attending: Radiation Oncology | Admitting: Radiation Oncology

## 2021-01-12 DIAGNOSIS — Z510 Encounter for antineoplastic radiation therapy: Secondary | ICD-10-CM | POA: Diagnosis not present

## 2021-01-13 ENCOUNTER — Ambulatory Visit
Admission: RE | Admit: 2021-01-13 | Discharge: 2021-01-13 | Disposition: A | Discharge status: Home / Self Care | Payer: Medicare Other | Source: Ambulatory Visit | Attending: Radiation Oncology | Admitting: Radiation Oncology

## 2021-01-13 DIAGNOSIS — Z510 Encounter for antineoplastic radiation therapy: Secondary | ICD-10-CM | POA: Diagnosis not present

## 2021-01-14 ENCOUNTER — Ambulatory Visit
Admission: RE | Admit: 2021-01-14 | Discharge: 2021-01-14 | Disposition: A | Discharge status: Home / Self Care | Payer: Medicare Other | Source: Ambulatory Visit | Attending: Radiation Oncology | Admitting: Radiation Oncology

## 2021-01-14 DIAGNOSIS — Z510 Encounter for antineoplastic radiation therapy: Secondary | ICD-10-CM | POA: Diagnosis not present

## 2021-01-17 ENCOUNTER — Ambulatory Visit
Admission: RE | Admit: 2021-01-17 | Discharge: 2021-01-17 | Disposition: A | Discharge status: Home / Self Care | Payer: Medicare Other | Source: Ambulatory Visit | Attending: Radiation Oncology | Admitting: Radiation Oncology

## 2021-01-17 DIAGNOSIS — Z510 Encounter for antineoplastic radiation therapy: Secondary | ICD-10-CM | POA: Diagnosis not present

## 2021-01-18 ENCOUNTER — Ambulatory Visit
Admission: RE | Admit: 2021-01-18 | Discharge: 2021-01-18 | Disposition: A | Discharge status: Home / Self Care | Payer: Medicare Other | Source: Ambulatory Visit | Attending: Radiation Oncology | Admitting: Radiation Oncology

## 2021-01-18 DIAGNOSIS — Z510 Encounter for antineoplastic radiation therapy: Secondary | ICD-10-CM | POA: Diagnosis not present

## 2021-01-19 ENCOUNTER — Ambulatory Visit
Admission: RE | Admit: 2021-01-19 | Discharge: 2021-01-19 | Disposition: A | Discharge status: Home / Self Care | Payer: Medicare Other | Source: Ambulatory Visit | Attending: Radiation Oncology | Admitting: Radiation Oncology

## 2021-01-19 DIAGNOSIS — Z510 Encounter for antineoplastic radiation therapy: Secondary | ICD-10-CM | POA: Diagnosis not present

## 2021-01-20 ENCOUNTER — Ambulatory Visit
Admission: RE | Admit: 2021-01-20 | Discharge: 2021-01-20 | Disposition: A | Discharge status: Home / Self Care | Payer: Medicare Other | Source: Ambulatory Visit | Attending: Radiation Oncology | Admitting: Radiation Oncology

## 2021-01-20 DIAGNOSIS — Z510 Encounter for antineoplastic radiation therapy: Secondary | ICD-10-CM | POA: Diagnosis not present

## 2021-01-21 ENCOUNTER — Inpatient Hospital Stay: Payer: Medicare Other

## 2021-01-21 ENCOUNTER — Ambulatory Visit
Admission: RE | Admit: 2021-01-21 | Discharge: 2021-01-21 | Disposition: A | Discharge status: Home / Self Care | Payer: Medicare Other | Source: Ambulatory Visit | Attending: Radiation Oncology | Admitting: Radiation Oncology

## 2021-01-21 ENCOUNTER — Other Ambulatory Visit: Payer: Self-pay

## 2021-01-21 DIAGNOSIS — D0512 Intraductal carcinoma in situ of left breast: Secondary | ICD-10-CM

## 2021-01-21 DIAGNOSIS — Z510 Encounter for antineoplastic radiation therapy: Secondary | ICD-10-CM | POA: Diagnosis not present

## 2021-01-21 LAB — CBC
HCT: 40.7 % (ref 36.0–46.0)
Hemoglobin: 13.7 g/dL (ref 12.0–15.0)
MCH: 32.1 pg (ref 26.0–34.0)
MCHC: 33.7 g/dL (ref 30.0–36.0)
MCV: 95.3 fL (ref 80.0–100.0)
Platelets: 205 10*3/uL (ref 150–400)
RBC: 4.27 MIL/uL (ref 3.87–5.11)
RDW: 12.6 % (ref 11.5–15.5)
WBC: 4 10*3/uL (ref 4.0–10.5)
nRBC: 0 % (ref 0.0–0.2)

## 2021-01-24 ENCOUNTER — Ambulatory Visit
Admission: RE | Admit: 2021-01-24 | Discharge: 2021-01-24 | Disposition: A | Discharge status: Home / Self Care | Payer: Medicare Other | Source: Ambulatory Visit | Attending: Radiation Oncology | Admitting: Radiation Oncology

## 2021-01-24 DIAGNOSIS — Z510 Encounter for antineoplastic radiation therapy: Secondary | ICD-10-CM | POA: Diagnosis not present

## 2021-01-25 ENCOUNTER — Ambulatory Visit
Admission: RE | Admit: 2021-01-25 | Discharge: 2021-01-25 | Disposition: A | Discharge status: Home / Self Care | Payer: Medicare Other | Source: Ambulatory Visit | Attending: Radiation Oncology | Admitting: Radiation Oncology

## 2021-01-25 DIAGNOSIS — Z510 Encounter for antineoplastic radiation therapy: Secondary | ICD-10-CM | POA: Diagnosis not present

## 2021-01-26 ENCOUNTER — Ambulatory Visit
Admission: RE | Admit: 2021-01-26 | Discharge: 2021-01-26 | Disposition: A | Discharge status: Home / Self Care | Payer: Medicare Other | Source: Ambulatory Visit | Attending: Radiation Oncology | Admitting: Radiation Oncology

## 2021-01-26 DIAGNOSIS — Z510 Encounter for antineoplastic radiation therapy: Secondary | ICD-10-CM | POA: Diagnosis not present

## 2021-01-27 ENCOUNTER — Ambulatory Visit
Admission: RE | Admit: 2021-01-27 | Discharge: 2021-01-27 | Disposition: A | Discharge status: Home / Self Care | Payer: Medicare Other | Source: Ambulatory Visit | Attending: Radiation Oncology | Admitting: Radiation Oncology

## 2021-01-27 DIAGNOSIS — Z510 Encounter for antineoplastic radiation therapy: Secondary | ICD-10-CM | POA: Diagnosis not present

## 2021-01-28 ENCOUNTER — Inpatient Hospital Stay (HOSPITAL_BASED_OUTPATIENT_CLINIC_OR_DEPARTMENT_OTHER): Payer: Medicare Other | Admitting: Internal Medicine

## 2021-01-28 ENCOUNTER — Encounter: Payer: Self-pay | Admitting: Internal Medicine

## 2021-01-28 ENCOUNTER — Other Ambulatory Visit: Payer: Self-pay

## 2021-01-28 VITALS — BP 127/55 | HR 63 | Temp 97.3°F | Resp 18 | Wt 244.4 lb

## 2021-01-28 DIAGNOSIS — M170 Bilateral primary osteoarthritis of knee: Secondary | ICD-10-CM | POA: Diagnosis not present

## 2021-01-28 DIAGNOSIS — D0512 Intraductal carcinoma in situ of left breast: Secondary | ICD-10-CM | POA: Diagnosis present

## 2021-01-28 DIAGNOSIS — Z79811 Long term (current) use of aromatase inhibitors: Secondary | ICD-10-CM

## 2021-01-28 DIAGNOSIS — Z170 Estrogen receptor positive status [ER+]: Secondary | ICD-10-CM | POA: Insufficient documentation

## 2021-01-28 DIAGNOSIS — M8580 Other specified disorders of bone density and structure, unspecified site: Secondary | ICD-10-CM | POA: Diagnosis not present

## 2021-01-28 DIAGNOSIS — L598 Other specified disorders of the skin and subcutaneous tissue related to radiation: Secondary | ICD-10-CM | POA: Diagnosis not present

## 2021-01-28 DIAGNOSIS — Z803 Family history of malignant neoplasm of breast: Secondary | ICD-10-CM | POA: Insufficient documentation

## 2021-01-28 DIAGNOSIS — Z800 Family history of malignant neoplasm of digestive organs: Secondary | ICD-10-CM | POA: Insufficient documentation

## 2021-01-28 NOTE — Progress Notes (Signed)
Patient finished radiation to her breast yesterday, skin is reddened.   She is scheduled for knee injections next week and would like to make sure that will be ok.

## 2021-01-28 NOTE — Assessment & Plan Note (Addendum)
#  Left breast DCIS ER POSitive- s/p Lumpectomy; s/p RT [finished April, 21, 2022]. Discussed re: mechanism of action of aromasin. Discussed the potential side effects including but not limited to hot flashes/arthralgias /risk of osteoporosis etc.  Recommend vitamin D.  Start prescription at next visit.  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  # June 2020 [T score -1.3- osteopenia]-recommend calcium plus vitamin D.  Plan to get bone density test prior.  GENETICS: Family history of breast cancer: mat granda ma- breast cancer; mother-colon cancer; mat aunt- breast cancer; aunts daughters- breast cancer. Discussed; pt declines [has twins-with downs; no siblings]  # DISPOSITION: # follow in 3 months; MD;BMD prior; no labs--Dr.B

## 2021-01-28 NOTE — Progress Notes (Signed)
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  - ASSOCIATED ATYPICAL LOBULAR HYPERPLASIA.  - TWO CLIPS AND TWO BIOPSY SITES IDENTIFIED.  - NO EVIDENCE OF INVASIVE CARCINOMA.  - SEE CANCER SUMMARY.   B. LYMPH NODE, LEFT AXILLARY SENTINEL; EXCISION:  - ONE LYMPH NODE, NEGATIVE FOR MALIGNANCY (0/1).    # SURVIVORSHIP:   # GENETICS:   DIAGNOSIS:   STAGE:         ;  GOALS:  CURRENT/MOST RECENT THERAPY :     Ductal carcinoma in situ (DCIS) of left breast  11/09/2020 Initial Diagnosis   Ductal carcinoma in situ (DCIS) of left breast   12/03/2020 Cancer Staging   Staging form: Breast, AJCC 8th Edition - Clinical: Stage 0 (cTis (DCIS), cN0, cM0, G2, ER+) - Signed by Cammie Sickle, MD on 12/03/2020 Stage prefix: Initial diagnosis      HISTORY OF PRESENTING ILLNESS:  Kelly Howell 75 y.o.  female DCIS is here is here for follow-up.  Patient finished radiation few days ago.  Noticed to have significant rash without skin peeling in the area of radiation portal.  Denies any fevers or chills or cough.  No nausea no vomiting.  Review of Systems  Constitutional: Negative for chills, diaphoresis, fever, malaise/fatigue and weight loss.  HENT: Negative for nosebleeds and sore throat.   Eyes: Negative for double vision.  Respiratory: Negative for cough, hemoptysis, sputum production, shortness of breath and wheezing.   Cardiovascular: Negative for chest  pain, palpitations, orthopnea and leg swelling.  Gastrointestinal: Negative for abdominal pain, blood in stool, constipation, diarrhea, heartburn, melena, nausea and vomiting.  Genitourinary: Negative for dysuria, frequency and urgency.  Musculoskeletal: Positive for joint pain. Negative for back pain.  Skin: Positive for rash. Negative for itching.  Neurological: Negative for dizziness, tingling, focal weakness, weakness and headaches.  Endo/Heme/Allergies: Does not bruise/bleed easily.  Psychiatric/Behavioral: Negative for depression. The patient is not nervous/anxious and does not have insomnia.      MEDICAL HISTORY:  Past Medical History:  Diagnosis Date  . Abnormal mammogram   . Atypical hyperplasia of breast    Left  . Breast cancer ([place]) 2000   left lumpectomy  . Colon polyps   . Dumping syndrome   . Dysrhythmia    pvc  . Hyperlipidemia   . Hypertension   . IBS (irritable bowel syndrome)   . Intermittent constipation   . Intermittent diarrhea   . Left sided lacunar infarction ([place])   . OA (osteoarthritis)   . Obesity   . Torus palatinus     SURGICAL HISTORY: Past Surgical History:  Procedure Laterality Date  . BREAST BIOPSY Left 2000   Positive  . BREAST BIOPSY Left 11/03/2020   affirm bx ,  posterior group coil marker, DCIS NUCLEAR GRADE 2 WITH COMEDONECROSIS AND ASSOCIATED CALCIFICATIONS  ATYPICAL LOBULAR HYPERPLASIA  . BREAST BIOPSY Left 11/03/2020   affirm bx, anterior group x marker, DCIS NUCLEAR GRADE 2 WITH COMEDONECROSIS AND ASSOCIATED CALCIFICATIONS  ATYPICAL LOBULAR HYPERPLASIA  . BREAST  LUMPECTOMY Left 2000   positive for breast ca  . BREAST LUMPECTOMY Left 11/19/2020   NL with SN DCIS  . CESAREAN SECTION    . CHOLECYSTECTOMY    . COLONOSCOPY    . COLONOSCOPY WITH PROPOFOL N/A 07/04/2016   Procedure: COLONOSCOPY WITH PROPOFOL;  Surgeon: Lollie Sails, MD;  Location: [facility] ENDOSCOPY;  Service: Endoscopy;  Laterality: N/A;  . Left Wrist Biopsy     . PARTIAL MASTECTOMY WITH NEEDLE LOCALIZATION AND AXILLARY SENTINEL LYMPH NODE BX Left 11/19/2020   Procedure: PARTIAL MASTECTOMY WITH NEEDLE LOCALIZATION AND AXILLARY SENTINEL LYMPH NODE BX;  Surgeon: Herbert Pun, MD;  Location: [facility] ORS;  Service: General;  Laterality: Left;    SOCIAL HISTORY: Social History   Socioeconomic History  . Marital status: Married    Spouse name: Not on file  . Number of children: Not on file  . Years of education: Not on file  . Highest education level: Not on file  Occupational History  . Not on file  Tobacco Use  . Smoking status: Never Smoker  . Smokeless tobacco: Never Used  Vaping Use  . Vaping Use: Never used  Substance and Sexual Activity  . Alcohol use: Never  . Drug use: Never  . Sexual activity: Not on file  Other Topics Concern  . Not on file  Social History Narrative   Lives in [place]. 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    Social Determinants of Health   Financial Resource Strain: Not on file  Food Insecurity: Not on file  Transportation Needs: Not on file  Physical Activity: Not on file  Stress: Not on file  Social Connections: Not on file  Intimate Partner Violence: Not on file    FAMILY HISTORY: Family History  Problem Relation Age of Onset  . Colon cancer Mother   . Breast cancer Maternal Aunt 65  . Breast cancer Paternal Grandmother 87    ALLERGIES:  is allergic to gantrisin [sulfisoxazole], sulfa antibiotics, and tape.  MEDICATIONS:  Current Outpatient Medications  Medication Sig Dispense Refill  . aspirin 81 MG chewable tablet Chew 81 mg by mouth daily.    Marland Kitchen atorvastatin (LIPITOR) 20 MG tablet Take 20 mg by mouth at bedtime.    . Coenzyme Q10 100 MG capsule Take 100 mg by mouth daily.    . fexofenadine (ALLEGRA) 180 MG tablet Take 180 mg by mouth daily as needed for allergies or rhinitis.    . fluticasone  (FLONASE) 50 MCG/ACT nasal spray Place 2 sprays into both nostrils daily as needed for rhinitis.    . Hylan 16 MG/2ML SOSY Inject 16 mcg into the articular space every 6 (six) months. Synvisc    . lisinopril (PRINIVIL,ZESTRIL) 20 MG tablet Take 20 mg by mouth daily.    . metoprolol tartrate (LOPRESSOR) 25 MG tablet Take 25 mg by mouth 2 (two) times daily.    . naproxen (NAPROSYN) 500 MG tablet Take 500 mg by mouth 2 (two) times daily with a meal.    . nystatin cream (MYCOSTATIN) Apply 1 application topically daily as needed (Yeast).    Marland Kitchen VITAMIN B1-B12 IM Inject 1,000 mcg into the muscle every 30 (thirty) days.    . Vitamin D3 (VITAMIN D) 25 MCG tablet Take 1,000 Units by mouth daily.     No current facility-administered medications for this visit.      Marland Kitchen  PHYSICAL EXAMINATION: ECOG PERFORMANCE STATUS: 0 - Asymptomatic  Vitals:   01/28/21 1039  BP: (!) 127/55  Pulse: 63  Resp: 18  Temp: (!) 97.3 F (36.3 C)   Filed Weights   01/28/21 1039  Weight: 244 lb 6.4 oz (110.9 kg)    Physical Exam HENT:     Head: Normocephalic and atraumatic.     Mouth/Throat:     Pharynx: No oropharyngeal exudate.  Eyes:     Pupils: Pupils are equal, round, and reactive to light.  Cardiovascular:     Rate and Rhythm: Normal rate and regular rhythm.  Pulmonary:     Effort: Pulmonary effort is normal. No respiratory distress.     Breath sounds: Normal breath sounds. No wheezing.  Abdominal:     General: Bowel sounds are normal. There is no distension.     Palpations: Abdomen is soft. There is no mass.     Tenderness: There is no abdominal tenderness. There is no guarding or rebound.  Musculoskeletal:        General: No tenderness. Normal range of motion.     Cervical back: Normal range of motion and neck supple.  Skin:    General: Skin is warm.     Comments: Erythema/warmth in the radiation portal left breast.  Neurological:     Mental Status: She is alert and oriented to person, place,  and time.  Psychiatric:        Mood and Affect: Affect normal.      LABORATORY DATA:  I have reviewed the data as listed Lab Results  Component Value Date   WBC 4.0 01/21/2021   HGB 13.7 01/21/2021   HCT 40.7 01/21/2021   MCV 95.3 01/21/2021   PLT 205 01/21/2021   No results for input(s): NA, K, CL, CO2, GLUCOSE, BUN, CREATININE, CALCIUM, GFRNONAA, GFRAA, PROT, ALBUMIN, AST, ALT, ALKPHOS, BILITOT, BILIDIR, IBILI in the last 8760 hours.  RADIOGRAPHIC STUDIES: I have personally reviewed the radiological images as listed and agreed with the findings in the report. No results found.  ASSESSMENT & PLAN:   Ductal carcinoma in situ (DCIS) of left breast #Left breast DCIS ER POSitive- s/p Lumpectomy; s/p RT [finished April, 21, 2022]. Discussed re: mechanism of action of aromasin. Discussed the potential side effects including but not limited to hot flashes/arthralgias /risk of osteoporosis etc.  Recommend vitamin D.  Start prescription at next visit.  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  # June 2020 [T score -1.3- osteopenia]-recommend calcium plus vitamin D.  Plan to get bone density test prior.  GENETICS: Family history of breast cancer: mat granda ma- breast cancer; mother-colon cancer; mat aunt- breast cancer; aunts daughters- breast cancer. Discussed; pt declines [has twins-with downs; no siblings]  # DISPOSITION: # follow in 3 months; MD;BMD prior; no labs--Dr.B   All questions were answered. The patient/family knows to call the clinic with any problems, questions or concerns.   Cammie Sickle, MD 02/04/2021 7:21 AM

## 2021-03-03 ENCOUNTER — Ambulatory Visit
Admission: RE | Admit: 2021-03-03 | Discharge: 2021-03-03 | Disposition: A | Discharge status: Home / Self Care | Payer: Medicare Other | Source: Ambulatory Visit | Attending: Radiation Oncology | Admitting: Radiation Oncology

## 2021-03-03 ENCOUNTER — Encounter: Payer: Self-pay | Admitting: Radiation Oncology

## 2021-03-03 DIAGNOSIS — Z923 Personal history of irradiation: Secondary | ICD-10-CM | POA: Insufficient documentation

## 2021-03-03 DIAGNOSIS — D0512 Intraductal carcinoma in situ of left breast: Secondary | ICD-10-CM | POA: Diagnosis present

## 2021-03-03 DIAGNOSIS — R5383 Other fatigue: Secondary | ICD-10-CM | POA: Diagnosis not present

## 2021-03-03 DIAGNOSIS — Z170 Estrogen receptor positive status [ER+]: Secondary | ICD-10-CM | POA: Insufficient documentation

## 2021-03-03 NOTE — Progress Notes (Signed)
Radiation Oncology Follow up Note  Name: Kelly Howell   Date:   03/03/2021 MRN:  [id number] DOB: 13-Apr-1946    This 75 y.o. female presents to the clinic today for 1 month follow-up status post whole breast radiation to her left breast for ER positive ductal carcinoma in situ.  REFERRING PROVIDER: Maryland Pink, MD  HPI: Patient is a 75 year old female now at 1 month having completed whole breast radiation to her left breast for ER positive ductal carcinoma in situ.  Seen today in routine follow-up she is doing well.  She specifically denies breast tenderness cough or bone pain.  She was quite fatigued after treatment although that is resolving.  She has not yet been started on antiestrogen therapy she is having a work-up for osteopenia..  COMPLICATIONS OF TREATMENT: none  FOLLOW UP COMPLIANCE: keeps appointments   PHYSICAL EXAM:  BP (!) (P) 119/43 (BP Location: Right Arm)   Pulse (P) 64   Temp (!) (P) 97 F (36.1 C) (Tympanic)   Resp (P) 16   Wt (P) 244 lb 8 oz (110.9 kg)   BMI (P) 41.97 kg/m  Lungs are clear to A&P cardiac examination essentially unremarkable with regular rate and rhythm. No dominant mass or nodularity is noted in either breast in 2 positions examined. Incision is well-healed. No axillary or supraclavicular adenopathy is appreciated. Cosmetic result is excellent.  Well-developed well-nourished patient in NAD. HEENT reveals PERLA, EOMI, discs not visualized.  Oral cavity is clear. No oral mucosal lesions are identified. Neck is clear without evidence of cervical or supraclavicular adenopathy. Lungs are clear to A&P. Cardiac examination is essentially unremarkable with regular rate and rhythm without murmur rub or thrill. Abdomen is benign with no organomegaly or masses noted. Motor sensory and DTR levels are equal and symmetric in the upper and lower extremities. Cranial nerves II through XII are grossly intact. Proprioception is intact. No peripheral adenopathy or edema  is identified. No motor or sensory levels are noted. Crude visual fields are within normal range.  RADIOLOGY RESULTS: No current films for review  PLAN: Present time patient is doing well 1 month out with very little side effect profile.  I am pleased with her overall progress.  I have asked to see her back in 4 to 5 months for follow-up.  Patient knows to call with any concerns.  She will continue to see medical oncology for consideration of antiestrogen therapy.  I would like to take this opportunity to thank you for allowing me to participate in the care of your patient.Noreene Filbert, MD

## 2021-03-09 ENCOUNTER — Other Ambulatory Visit: Payer: Self-pay

## 2021-03-09 ENCOUNTER — Ambulatory Visit
Admission: RE | Admit: 2021-03-09 | Discharge: 2021-03-09 | Disposition: A | Discharge status: Home / Self Care | Payer: Medicare Other | Source: Ambulatory Visit | Attending: Internal Medicine | Admitting: Internal Medicine

## 2021-03-09 DIAGNOSIS — Z79811 Long term (current) use of aromatase inhibitors: Secondary | ICD-10-CM | POA: Diagnosis present

## 2021-03-09 DIAGNOSIS — D0512 Intraductal carcinoma in situ of left breast: Secondary | ICD-10-CM | POA: Insufficient documentation

## 2021-05-03 ENCOUNTER — Inpatient Hospital Stay: Payer: Medicare Other | Admitting: Nurse Practitioner

## 2021-05-11 ENCOUNTER — Encounter: Payer: Self-pay | Admitting: Internal Medicine

## 2021-05-11 ENCOUNTER — Inpatient Hospital Stay: Payer: Medicare Other | Attending: Internal Medicine | Admitting: Internal Medicine

## 2021-05-11 ENCOUNTER — Other Ambulatory Visit: Payer: Self-pay

## 2021-05-11 DIAGNOSIS — D0512 Intraductal carcinoma in situ of left breast: Secondary | ICD-10-CM

## 2021-05-11 DIAGNOSIS — M8580 Other specified disorders of bone density and structure, unspecified site: Secondary | ICD-10-CM | POA: Insufficient documentation

## 2021-05-11 DIAGNOSIS — Z170 Estrogen receptor positive status [ER+]: Secondary | ICD-10-CM | POA: Insufficient documentation

## 2021-05-11 DIAGNOSIS — L598 Other specified disorders of the skin and subcutaneous tissue related to radiation: Secondary | ICD-10-CM | POA: Insufficient documentation

## 2021-05-11 MED ORDER — ANASTROZOLE 1 MG PO TABS
1.0000 mg | ORAL_TABLET | Freq: Every day | ORAL | 3 refills | Status: DC
Start: 2021-05-11 — End: 2021-07-19

## 2021-05-11 NOTE — Progress Notes (Signed)
.[facility] [place] NOTE  Patient Care Team: Maryland Pink, MD as PCP - General (Family Medicine) Theodore Demark, RN as Oncology Nurse Navigator Cammie Sickle, MD as Consulting Physician (Internal Medicine) Herbert Pun, MD as Consulting Physician (General Surgery) Noreene Filbert, MD as Referring Physician (Radiation Oncology)  CHIEF COMPLAINTS/PURPOSE OF CONSULTATION: DCIS  #  Oncology History Overview Note  # 2005- left breast lumpectomy [Dr.Craford; Dr.Chokis; II opinion [facility]- hyperplasia] Tam x5 years   # 2022-LEFT BREAST DCIS x2; #A] Bx-cannot rule out microinvasive disease; ER positive; Dr.Cintron  BREAST, LEFT; EXCISION:  - DUCTAL CARCINOMA IN SITU, INTERMEDIATE GRADE, WITH COMEDONECROSIS AND  ASSOCIATED CALCIFICATIONS.  - ASSOCIATED ATYPICAL LOBULAR HYPERPLASIA.  - TWO CLIPS AND TWO BIOPSY SITES IDENTIFIED.  - NO EVIDENCE OF INVASIVE CARCINOMA.  - SEE CANCER SUMMARY.   B. LYMPH NODE, LEFT AXILLARY SENTINEL; EXCISION:  - ONE LYMPH NODE, NEGATIVE FOR MALIGNANCY (0/1).   # AUG, 3rd 2022-anastrozole.   # SURVIVORSHIP:   # GENETICS:   DIAGNOSIS:   STAGE:         ;  GOALS:  CURRENT/MOST RECENT THERAPY :     Ductal carcinoma in situ (DCIS) of left breast  11/09/2020 Initial Diagnosis   Ductal carcinoma in situ (DCIS) of left breast   12/03/2020 Cancer Staging   Staging form: Breast, AJCC 8th Edition - Clinical: Stage 0 (cTis (DCIS), cN0, cM0, G2, ER+) - Signed by Cammie Sickle, MD on 12/03/2020 Stage prefix: Initial diagnosis      HISTORY OF PRESENTING ILLNESS:  Kelly Howell 75 y.o.  female DCIS s/p radiation is here for follow-up/review results of the bone density  Patient admits to improvement of her rash from radiation.  Denies any further skin peeling.  Mild fatigue.  Patient denies any new lumps or bumps.  Appetite is good.  No weight loss.  No nausea or vomiting.  Review of Systems  Constitutional:   Negative for chills, diaphoresis, fever, malaise/fatigue and weight loss.  HENT:  Negative for nosebleeds and sore throat.   Eyes:  Negative for double vision.  Respiratory:  Negative for cough, hemoptysis, sputum production, shortness of breath and wheezing.   Cardiovascular:  Negative for chest pain, palpitations, orthopnea and leg swelling.  Gastrointestinal:  Negative for abdominal pain, blood in stool, constipation, diarrhea, heartburn, melena, nausea and vomiting.  Genitourinary:  Negative for dysuria, frequency and urgency.  Musculoskeletal:  Positive for joint pain. Negative for back pain.  Skin:  Negative for itching.  Neurological:  Negative for dizziness, tingling, focal weakness, weakness and headaches.  Endo/Heme/Allergies:  Does not bruise/bleed easily.  Psychiatric/Behavioral:  Negative for depression. The patient is not nervous/anxious and does not have insomnia.     MEDICAL HISTORY:  Past Medical History:  Diagnosis Date   Abnormal mammogram    Atypical hyperplasia of breast    Left   Breast cancer ([place]) 2000   left lumpectomy   Colon polyps    Dumping syndrome    Dysrhythmia    pvc   Hyperlipidemia    Hypertension    IBS (irritable bowel syndrome)    Intermittent constipation    Intermittent diarrhea    Left sided lacunar infarction ([facility])    OA (osteoarthritis)    Obesity    Torus palatinus     SURGICAL HISTORY: Past Surgical History:  Procedure Laterality Date   BREAST BIOPSY Left 2000   Positive   BREAST BIOPSY Left 11/03/2020   affirm bx ,  posterior group coil marker, DCIS NUCLEAR GRADE 2 WITH COMEDONECROSIS AND ASSOCIATED CALCIFICATIONS  ATYPICAL LOBULAR HYPERPLASIA   BREAST BIOPSY Left 11/03/2020   affirm bx, anterior group x marker, DCIS NUCLEAR GRADE 2 WITH COMEDONECROSIS AND ASSOCIATED CALCIFICATIONS  ATYPICAL LOBULAR HYPERPLASIA   BREAST LUMPECTOMY Left 2000   positive for breast ca   BREAST LUMPECTOMY Left 11/19/2020   NL with SN DCIS    CESAREAN SECTION     CHOLECYSTECTOMY     COLONOSCOPY     COLONOSCOPY WITH PROPOFOL N/A 07/04/2016   Procedure: COLONOSCOPY WITH PROPOFOL;  Surgeon: Lollie Sails, MD;  Location: [facility] ENDOSCOPY;  Service: Endoscopy;  Laterality: N/A;   Left Wrist Biopsy     PARTIAL MASTECTOMY WITH NEEDLE LOCALIZATION AND AXILLARY SENTINEL LYMPH NODE BX Left 11/19/2020   Procedure: PARTIAL MASTECTOMY WITH NEEDLE LOCALIZATION AND AXILLARY SENTINEL LYMPH NODE BX;  Surgeon: Herbert Pun, MD;  Location: [facility] ORS;  Service: General;  Laterality: Left;    SOCIAL HISTORY: Social History   Socioeconomic History   Marital status: Married    Spouse name: Not on file   Number of children: Not on file   Years of education: Not on file   Highest education level: Not on file  Occupational History   Not on file  Tobacco Use   Smoking status: Never   Smokeless tobacco: Never  Vaping Use   Vaping Use: Never used  Substance and Sexual Activity   Alcohol use: Never   Drug use: Never   Sexual activity: Not on file  Other Topics Concern   Not on file  Social History Narrative   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    Social Determinants of Health   Financial Resource Strain: Not on file  Food Insecurity: Not on file  Transportation Needs: Not on file  Physical Activity: Not on file  Stress: Not on file  Social Connections: Not on file  Intimate Partner Violence: Not on file    FAMILY HISTORY: Family History  Problem Relation Age of Onset   Colon cancer Mother    Breast cancer Maternal Aunt 65   Breast cancer Paternal Grandmother 40    ALLERGIES:  is allergic to gantrisin [sulfisoxazole], sulfa antibiotics, and tape.  MEDICATIONS:  Current Outpatient Medications  Medication Sig Dispense Refill   anastrozole (ARIMIDEX) 1 MG tablet Take 1 tablet (1 mg total) by mouth daily. 30  tablet 3   aspirin 81 MG chewable tablet Chew 81 mg by mouth daily.     atorvastatin (LIPITOR) 20 MG tablet Take 20 mg by mouth at bedtime.     Coenzyme Q10 100 MG capsule Take 100 mg by mouth daily.     fexofenadine (ALLEGRA) 180 MG tablet Take 180 mg by mouth daily as needed for allergies or rhinitis.     fluticasone (FLONASE) 50 MCG/ACT nasal spray Place 2 sprays into both nostrils daily as needed for rhinitis.     Hylan 16 MG/2ML SOSY Inject 16 mcg into the articular space every 6 (six) months. Synvisc     lisinopril (PRINIVIL,ZESTRIL) 20 MG tablet Take 20 mg by mouth daily.     metoprolol tartrate (LOPRESSOR) 25 MG tablet Take 25 mg by mouth 2 (two) times daily.     naproxen (NAPROSYN) 500 MG tablet Take 500 mg by mouth 2 (two) times daily with a meal.  nystatin cream (MYCOSTATIN) Apply 1 application topically daily as needed (Yeast).     VITAMIN B1-B12 IM Inject 1,000 mcg into the muscle every 30 (thirty) days.     Vitamin D3 (VITAMIN D) 25 MCG tablet Take 1,000 Units by mouth daily.     No current facility-administered medications for this visit.      Marland Kitchen  PHYSICAL EXAMINATION: ECOG PERFORMANCE STATUS: 0 - Asymptomatic  Vitals:   05/11/21 1518  BP: (!) 112/55  Pulse: 65  Resp: 20  Temp: (!) 97 F (36.1 C)   Filed Weights   05/11/21 1518  Weight: 111.1 kg    Physical Exam HENT:     Head: Normocephalic and atraumatic.     Mouth/Throat:     Pharynx: No oropharyngeal exudate.  Eyes:     Pupils: Pupils are equal, round, and reactive to light.  Cardiovascular:     Rate and Rhythm: Normal rate and regular rhythm.  Pulmonary:     Effort: Pulmonary effort is normal. No respiratory distress.     Breath sounds: Normal breath sounds. No wheezing.  Abdominal:     General: Bowel sounds are normal. There is no distension.     Palpations: Abdomen is soft. There is no mass.     Tenderness: There is no abdominal tenderness. There is no guarding or rebound.  Musculoskeletal:         General: No tenderness. Normal range of motion.     Cervical back: Normal range of motion and neck supple.  Skin:    General: Skin is warm.     Comments: Erythema/warmth in the radiation portal left breast.  Neurological:     Mental Status: She is alert and oriented to person, place, and time.  Psychiatric:        Mood and Affect: Affect normal.     LABORATORY DATA:  I have reviewed the data as listed Lab Results  Component Value Date   WBC 4.0 01/21/2021   HGB 13.7 01/21/2021   HCT 40.7 01/21/2021   MCV 95.3 01/21/2021   PLT 205 01/21/2021   No results for input(s): NA, K, CL, CO2, GLUCOSE, BUN, CREATININE, CALCIUM, GFRNONAA, GFRAA, PROT, ALBUMIN, AST, ALT, ALKPHOS, BILITOT, BILIDIR, IBILI in the last 8760 hours.  RADIOGRAPHIC STUDIES: I have personally reviewed the radiological images as listed and agreed with the findings in the report. No results found.  ASSESSMENT & PLAN:   Ductal carcinoma in situ (DCIS) of left breast #Left breast DCIS ER POSitive- s/p Lumpectomy; s/p RT [finished April, 21, 2022].  Proceed with chemoprevention with anastrozole.  New prescription ordered.  #I again reviewed/discussed re: mechanism of action of the iron.  Discussed the potential side effects including but not limited to hot flashes/arthralgias /risk of osteoporosis etc.  Recommend vitamin D.   #Osteopenia : Worse- bone density [June-- 2022- T-score of -2.0.; June 2020 [T score -1.3]- Discussed the potential risk factors for osteoporosis- age/gender/postmenopausal status/use of anti-estrogen treatments. Discussed multiple options including exercise/ calcium and vitamin D supplementation/ and also use of medications. however I think is very reasonable to emphasize lifestyle modifications first.  Patient agreement.  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  GENETICS: Family history of breast cancer: mat granda ma- breast cancer; mother-colon cancer; mat aunt-  breast cancer; aunts daughters- breast cancer. Discussed; pt declines [has twins-with downs; no siblings]  # DISPOSITION: # follow in 6 weeks; MD; no labs--Dr.B   All questions were answered. The patient/family knows to call the clinic with  any problems, questions or concerns.   Cammie Sickle, MD 05/22/2021 10:12 PM

## 2021-05-11 NOTE — Assessment & Plan Note (Addendum)
#  Left breast DCIS ER POSitive- s/p Lumpectomy; s/p RT [finished April, 21, 2022].  Proceed with chemoprevention with anastrozole.  New prescription ordered.  #I again reviewed/discussed re: mechanism of action of the iron.  Discussed the potential side effects including but not limited to hot flashes/arthralgias /risk of osteoporosis etc.  Recommend vitamin D.   #Osteopenia : Worse- bone density [June-- 2022- T-score of -2.0.; June 2020 [T score -1.3]- Discussed the potential risk factors for osteoporosis- age/gender/postmenopausal status/use of anti-estrogen treatments. Discussed multiple options including exercise/ calcium and vitamin D supplementation/ and also use of medications. however I think is very reasonable to emphasize lifestyle modifications first.  Patient agreement.  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  GENETICS: Family history of breast cancer: mat granda ma- breast cancer; mother-colon cancer; mat aunt- breast cancer; aunts daughters- breast cancer. Discussed; pt declines [has twins-with downs; no siblings]  # DISPOSITION: # follow in 6 weeks; MD; no labs--Dr.B

## 2021-06-22 ENCOUNTER — Other Ambulatory Visit: Payer: Self-pay

## 2021-06-22 ENCOUNTER — Encounter: Payer: Self-pay | Admitting: *Deleted

## 2021-06-22 ENCOUNTER — Inpatient Hospital Stay: Payer: Medicare Other | Attending: Internal Medicine | Admitting: Internal Medicine

## 2021-06-22 DIAGNOSIS — Z79899 Other long term (current) drug therapy: Secondary | ICD-10-CM | POA: Diagnosis not present

## 2021-06-22 DIAGNOSIS — E785 Hyperlipidemia, unspecified: Secondary | ICD-10-CM | POA: Diagnosis not present

## 2021-06-22 DIAGNOSIS — Z7982 Long term (current) use of aspirin: Secondary | ICD-10-CM | POA: Insufficient documentation

## 2021-06-22 DIAGNOSIS — Z79811 Long term (current) use of aromatase inhibitors: Secondary | ICD-10-CM | POA: Insufficient documentation

## 2021-06-22 DIAGNOSIS — D0512 Intraductal carcinoma in situ of left breast: Secondary | ICD-10-CM | POA: Insufficient documentation

## 2021-06-22 DIAGNOSIS — M8580 Other specified disorders of bone density and structure, unspecified site: Secondary | ICD-10-CM | POA: Diagnosis not present

## 2021-06-22 DIAGNOSIS — Z170 Estrogen receptor positive status [ER+]: Secondary | ICD-10-CM | POA: Insufficient documentation

## 2021-06-22 DIAGNOSIS — I10 Essential (primary) hypertension: Secondary | ICD-10-CM | POA: Insufficient documentation

## 2021-06-22 NOTE — Progress Notes (Signed)
.[facility] one [place] NOTE  Patient Care Team: [place] Pink, MD as PCP - General (Family Medicine) Theodore Demark, RN as Oncology Nurse Navigator Cammie Sickle, MD as Consulting Physician (Internal Medicine) Herbert Pun, MD as Consulting Physician (General Surgery) Noreene Filbert, MD as Referring Physician (Radiation Oncology)  CHIEF COMPLAINTS/PURPOSE OF CONSULTATION: DCIS  #  Oncology History Overview Note  # 2005- left breast lumpectomy [Dr.Craford; Dr.Chokis; II opinion [facility]- hyperplasia] Tam x5 years   # 2022-LEFT BREAST DCIS x2; #A] Bx-cannot rule out microinvasive disease; ER positive; Dr.Cintron  BREAST, LEFT; EXCISION:  - DUCTAL CARCINOMA IN SITU, INTERMEDIATE GRADE, WITH COMEDONECROSIS AND  ASSOCIATED CALCIFICATIONS.  - ASSOCIATED ATYPICAL LOBULAR HYPERPLASIA.  - TWO CLIPS AND TWO BIOPSY SITES IDENTIFIED.  - NO EVIDENCE OF INVASIVE CARCINOMA.  - SEE CANCER SUMMARY.   B. LYMPH NODE, LEFT AXILLARY SENTINEL; EXCISION:  - ONE LYMPH NODE, NEGATIVE FOR MALIGNANCY (0/1).   # AUG, 3rd 2022-anastrozole x6 weeks; STOP SEP 2022 [sec to [facility]]   # SURVIVORSHIP:   # GENETICS:   DIAGNOSIS:   STAGE:         ;  GOALS:  CURRENT/MOST RECENT THERAPY :     Ductal carcinoma in situ (DCIS) of left breast  11/09/2020 Initial Diagnosis   Ductal carcinoma in situ (DCIS) of left breast   12/03/2020 Cancer Staging   Staging form: Breast, AJCC 8th Edition - Clinical: Stage 0 (cTis (DCIS), cN0, cM0, G2, ER+) - Signed by Cammie Sickle, MD on 12/03/2020 Stage prefix: Initial diagnosis      HISTORY OF PRESENTING ILLNESS: Ambulating with wheelchair.  Alone. Kelly Howell 75 y.o.  female DCIS of the left breast on anastrozole is here for follow-up.  Patient has been on anastrozole x6 weeks.  However-after starting anastrozole noted to have worsening joint pains; fatigue; facial puffiness; also swelling in the legs.  Patient in general  walks with a cane.;  However given several side effects she has been using a wheelchair.  Review of Systems  Constitutional:  Positive for malaise/fatigue. Negative for chills, diaphoresis, fever and weight loss.  HENT:  Negative for nosebleeds and sore throat.   Eyes:  Negative for double vision.  Respiratory:  Negative for cough, hemoptysis, sputum production, shortness of breath and wheezing.   Cardiovascular:  Positive for leg swelling. Negative for chest pain, palpitations and orthopnea.  Gastrointestinal:  Negative for abdominal pain, blood in stool, constipation, diarrhea, heartburn, melena, nausea and vomiting.  Genitourinary:  Negative for dysuria, frequency and urgency.  Musculoskeletal:  Positive for back pain, joint pain and myalgias.  Skin:  Negative for itching.  Neurological:  Negative for dizziness, tingling, focal weakness, weakness and headaches.  Endo/Heme/Allergies:  Does not bruise/bleed easily.  Psychiatric/Behavioral:  Negative for depression. The patient is not nervous/anxious and does not have insomnia.     MEDICAL HISTORY:  Past Medical History:  Diagnosis Date   Abnormal mammogram    Atypical hyperplasia of breast    Left   Breast cancer ([place]) 2000   left lumpectomy   Colon polyps    Dumping syndrome    Dysrhythmia    pvc   Hyperlipidemia    Hypertension    IBS (irritable bowel syndrome)    Intermittent constipation    Intermittent diarrhea    Left sided lacunar infarction ([facility])    OA (osteoarthritis)    Obesity    Torus palatinus     SURGICAL HISTORY: Past Surgical History:  Procedure  Laterality Date   BREAST BIOPSY Left 2000   Positive   BREAST BIOPSY Left 11/03/2020   affirm bx ,  posterior group coil marker, DCIS NUCLEAR GRADE 2 WITH COMEDONECROSIS AND ASSOCIATED CALCIFICATIONS  ATYPICAL LOBULAR HYPERPLASIA   BREAST BIOPSY Left 11/03/2020   affirm bx, anterior group x marker, DCIS NUCLEAR GRADE 2 WITH COMEDONECROSIS AND ASSOCIATED  CALCIFICATIONS  ATYPICAL LOBULAR HYPERPLASIA   BREAST LUMPECTOMY Left 2000   positive for breast ca   BREAST LUMPECTOMY Left 11/19/2020   NL with SN DCIS   CESAREAN SECTION     CHOLECYSTECTOMY     COLONOSCOPY     COLONOSCOPY WITH PROPOFOL N/A 07/04/2016   Procedure: COLONOSCOPY WITH PROPOFOL;  Surgeon: Lollie Sails, MD;  Location: [facility] ENDOSCOPY;  Service: Endoscopy;  Laterality: N/A;   Left Wrist Biopsy     PARTIAL MASTECTOMY WITH NEEDLE LOCALIZATION AND AXILLARY SENTINEL LYMPH NODE BX Left 11/19/2020   Procedure: PARTIAL MASTECTOMY WITH NEEDLE LOCALIZATION AND AXILLARY SENTINEL LYMPH NODE BX;  Surgeon: Herbert Pun, MD;  Location: [facility] ORS;  Service: General;  Laterality: Left;    SOCIAL HISTORY: Social History   Socioeconomic History   Marital status: Married    Spouse name: Not on file   Number of children: Not on file   Years of education: Not on file   Highest education level: Not on file  Occupational History   Not on file  Tobacco Use   Smoking status: Never   Smokeless tobacco: Never  Vaping Use   Vaping Use: Never used  Substance and Sexual Activity   Alcohol use: Never   Drug use: Never   Sexual activity: Not on file  Other Topics Concern   Not on file  Social History Narrative   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    Social Determinants of Health   Financial Resource Strain: Not on file  Food Insecurity: Not on file  Transportation Needs: Not on file  Physical Activity: Not on file  Stress: Not on file  Social Connections: Not on file  Intimate Partner Violence: Not on file    FAMILY HISTORY: Family History  Problem Relation Age of Onset   Colon cancer Mother    Breast cancer Maternal Aunt 65   Breast cancer Paternal Grandmother 23    ALLERGIES:  is allergic to gantrisin [sulfisoxazole], sulfa antibiotics, and  tape.  MEDICATIONS:  Current Outpatient Medications  Medication Sig Dispense Refill   anastrozole (ARIMIDEX) 1 MG tablet Take 1 tablet (1 mg total) by mouth daily. 30 tablet 3   aspirin 81 MG chewable tablet Chew 81 mg by mouth daily.     atorvastatin (LIPITOR) 20 MG tablet Take 20 mg by mouth at bedtime.     Coenzyme Q10 100 MG capsule Take 100 mg by mouth daily.     cyanocobalamin (,VITAMIN B-12,) 1000 MCG/ML injection Inject 1,000 mcg into the muscle every 30 (thirty) days.     fexofenadine (ALLEGRA) 180 MG tablet Take 180 mg by mouth daily as needed for allergies or rhinitis.     fluticasone (FLONASE) 50 MCG/ACT nasal spray Place 2 sprays into both nostrils daily as needed for rhinitis.     Hylan 16 MG/2ML SOSY Inject 16 mcg into the articular space every 6 (six) months. Synvisc     lisinopril (PRINIVIL,ZESTRIL) 20 MG tablet Take 20 mg by mouth daily.  metoprolol tartrate (LOPRESSOR) 25 MG tablet Take 25 mg by mouth 2 (two) times daily.     naproxen (NAPROSYN) 500 MG tablet Take 500 mg by mouth 2 (two) times daily with a meal.     nystatin cream (MYCOSTATIN) Apply 1 application topically daily as needed (Yeast).     Vitamin D3 (VITAMIN D) 25 MCG tablet Take 1,000 Units by mouth daily.     No current facility-administered medications for this visit.      Marland Kitchen  PHYSICAL EXAMINATION: ECOG PERFORMANCE STATUS: 0 - Asymptomatic  Vitals:   06/22/21 0948  BP: 130/66  Pulse: 62  Resp: 18  Temp: 98.9 F (37.2 C)  SpO2: 98%   Filed Weights   06/22/21 0938  Weight: 246 lb (111.6 kg)    Physical Exam HENT:     Head: Normocephalic and atraumatic.     Mouth/Throat:     Pharynx: No oropharyngeal exudate.  Eyes:     Pupils: Pupils are equal, round, and reactive to light.  Cardiovascular:     Rate and Rhythm: Normal rate and regular rhythm.  Pulmonary:     Effort: Pulmonary effort is normal. No respiratory distress.     Breath sounds: Normal breath sounds. No wheezing.   Abdominal:     General: Bowel sounds are normal. There is no distension.     Palpations: Abdomen is soft. There is no mass.     Tenderness: There is no abdominal tenderness. There is no guarding or rebound.  Musculoskeletal:        General: No tenderness. Normal range of motion.     Cervical back: Normal range of motion and neck supple.  Skin:    General: Skin is warm.     Comments: Erythema/warmth in the radiation portal left breast.  Neurological:     Mental Status: She is alert and oriented to person, place, and time.  Psychiatric:        Mood and Affect: Affect normal.     LABORATORY DATA:  I have reviewed the data as listed Lab Results  Component Value Date   WBC 4.0 01/21/2021   HGB 13.7 01/21/2021   HCT 40.7 01/21/2021   MCV 95.3 01/21/2021   PLT 205 01/21/2021   No results for input(s): NA, K, CL, CO2, GLUCOSE, BUN, CREATININE, CALCIUM, GFRNONAA, GFRAA, PROT, ALBUMIN, AST, ALT, ALKPHOS, BILITOT, BILIDIR, IBILI in the last 8760 hours.  RADIOGRAPHIC STUDIES: I have personally reviewed the radiological images as listed and agreed with the findings in the report. No results found.  ASSESSMENT & PLAN:   Ductal carcinoma in situ (DCIS) of left breast #Left breast DCIS ER POSitive- s/p Lumpectomy; s/p RT [finished April, 21, 2022]. Currently on anastrozole x 6 weeks; Tolerating poorly- leg swelling/ facial puffiness; hot flashes; joint aches.  # DISCONTINUE Anastrazole-given the risk versus benefits.  We will consider Aromasin at next visit.   #Osteopenia : Worse- bone density [June-- 2022- T-score of -2.0.; June 2020 [T score -1.3]-  On Vit D 1000/day. [not calcium-]  # DISPOSITION: # follow in 4 weeks; MD; no labs--Dr.B   All questions were answered. The patient/family knows to call the clinic with any problems, questions or concerns.   Cammie Sickle, MD 06/22/2021 8:32 PM

## 2021-06-22 NOTE — Assessment & Plan Note (Addendum)
#  Left breast DCIS ER POSitive- s/p Lumpectomy; s/p RT [finished April, 21, 2022]. Currently on anastrozole x 6 weeks; Tolerating poorly- leg swelling/ facial puffiness; hot flashes; joint aches.  # DISCONTINUE Anastrazole-given the risk versus benefits.  We will consider Aromasin at next visit.   #Osteopenia : Worse- bone density [June-- 2022- T-score of -2.0.; June 2020 [T score -1.3]-  On Vit D 1000/day. [not calcium-]  # DISPOSITION: # follow in 4 weeks; MD; no labs--Dr.B

## 2021-06-22 NOTE — Patient Instructions (Signed)
#  Stop taking anastrozole-until further directions.

## 2021-06-22 NOTE — Progress Notes (Signed)
Since starting the anastrozole patient reports: swollen feet at the end of the day, facial pressure and feeling swollen, hot flashes mostly at night which wakes her up, and increased joint pain in the knees.

## 2021-07-19 ENCOUNTER — Other Ambulatory Visit: Payer: Self-pay

## 2021-07-19 ENCOUNTER — Encounter: Payer: Self-pay | Admitting: Internal Medicine

## 2021-07-19 ENCOUNTER — Inpatient Hospital Stay: Payer: Medicare Other | Attending: Internal Medicine | Admitting: Internal Medicine

## 2021-07-19 DIAGNOSIS — M8580 Other specified disorders of bone density and structure, unspecified site: Secondary | ICD-10-CM | POA: Insufficient documentation

## 2021-07-19 DIAGNOSIS — M810 Age-related osteoporosis without current pathological fracture: Secondary | ICD-10-CM | POA: Insufficient documentation

## 2021-07-19 DIAGNOSIS — D0512 Intraductal carcinoma in situ of left breast: Secondary | ICD-10-CM | POA: Diagnosis present

## 2021-07-19 DIAGNOSIS — Z170 Estrogen receptor positive status [ER+]: Secondary | ICD-10-CM | POA: Diagnosis not present

## 2021-07-19 MED ORDER — ANASTROZOLE 1 MG PO TABS: 1.0000 mg | ORAL_TABLET | Freq: Every day | ORAL | 3 refills | Status: DC

## 2021-07-19 NOTE — Progress Notes (Signed)
.[facility] one [place] NOTE  Patient Care Team: [place] Pink, MD as PCP - General (Family Medicine) Theodore Demark, RN as Oncology Nurse Navigator Cammie Sickle, MD as Consulting Physician (Internal Medicine) Herbert Pun, MD as Consulting Physician (General Surgery) Noreene Filbert, MD as Referring Physician (Radiation Oncology)  CHIEF COMPLAINTS/PURPOSE OF CONSULTATION: DCIS  #  Oncology History Overview Note  # 2005- left breast lumpectomy [Dr.Craford; Dr.Chokis; II opinion [facility]- hyperplasia] Tam x5 years   # 2022-LEFT BREAST DCIS x2; #A] Bx-cannot rule out microinvasive disease; ER positive; Dr.Cintron  BREAST, LEFT; EXCISION:  - DUCTAL CARCINOMA IN SITU, INTERMEDIATE GRADE, WITH COMEDONECROSIS AND  ASSOCIATED CALCIFICATIONS.  - ASSOCIATED ATYPICAL LOBULAR HYPERPLASIA.  - TWO CLIPS AND TWO BIOPSY SITES IDENTIFIED.  - NO EVIDENCE OF INVASIVE CARCINOMA.  - SEE CANCER SUMMARY.   B. LYMPH NODE, LEFT AXILLARY SENTINEL; EXCISION:  - ONE LYMPH NODE, NEGATIVE FOR MALIGNANCY (0/1).   # AUG, 3rd 2022-anastrozole x6 weeks; STOP SEP 2022 [sec to [facility]]   # SURVIVORSHIP:   # GENETICS:   DIAGNOSIS:   STAGE:         ;  GOALS:  CURRENT/MOST RECENT THERAPY :     Ductal carcinoma in situ (DCIS) of left breast  11/09/2020 Initial Diagnosis   Ductal carcinoma in situ (DCIS) of left breast   12/03/2020 Cancer Staging   Staging form: Breast, AJCC 8th Edition - Clinical: Stage 0 (cTis (DCIS), cN0, cM0, G2, ER+) - Signed by Cammie Sickle, MD on 12/03/2020 Stage prefix: Initial diagnosis      HISTORY OF PRESENTING ILLNESS: Ambulating with wheelchair.  Alone. Kelly Howell 75 y.o.  female DCIS of the left breast most recently on anastrozole is here for follow-up.  Patient was taken off anastrozole because of multiple complaints approximately 4 weeks ago.  Patient's complaints at the time included worsening joint pains; fatigue; facial  puffiness; also swelling in the legs.  Since stopping-pain has resolved patient symptoms have improved.  She is back to baseline.  Review of Systems  Constitutional:  Negative for chills, diaphoresis, fever and weight loss.  HENT:  Negative for nosebleeds and sore throat.   Eyes:  Negative for double vision.  Respiratory:  Negative for cough, hemoptysis, sputum production, shortness of breath and wheezing.   Cardiovascular:  Negative for chest pain, palpitations and orthopnea.  Gastrointestinal:  Negative for abdominal pain, blood in stool, constipation, diarrhea, heartburn, melena, nausea and vomiting.  Genitourinary:  Negative for dysuria, frequency and urgency.  Musculoskeletal:  Positive for back pain and joint pain.  Skin:  Negative for itching.  Neurological:  Negative for dizziness, tingling, focal weakness, weakness and headaches.  Endo/Heme/Allergies:  Does not bruise/bleed easily.  Psychiatric/Behavioral:  Negative for depression. The patient is not nervous/anxious and does not have insomnia.     MEDICAL HISTORY:  Past Medical History:  Diagnosis Date  . Abnormal mammogram   . Atypical hyperplasia of breast    Left  . Breast cancer ([place]) 2000   left lumpectomy  . Colon polyps   . Dumping syndrome   . Dysrhythmia    pvc  . Hyperlipidemia   . Hypertension   . IBS (irritable bowel syndrome)   . Intermittent constipation   . Intermittent diarrhea   . Left sided lacunar infarction ([place])   . OA (osteoarthritis)   . Obesity   . Torus palatinus     SURGICAL HISTORY: Past Surgical History:  Procedure Laterality Date  . BREAST  BIOPSY Left 2000   Positive  . BREAST BIOPSY Left 11/03/2020   affirm bx ,  posterior group coil marker, DCIS NUCLEAR GRADE 2 WITH COMEDONECROSIS AND ASSOCIATED CALCIFICATIONS  ATYPICAL LOBULAR HYPERPLASIA  . BREAST BIOPSY Left 11/03/2020   affirm bx, anterior group x marker, DCIS NUCLEAR GRADE 2 WITH COMEDONECROSIS AND ASSOCIATED CALCIFICATIONS   ATYPICAL LOBULAR HYPERPLASIA  . BREAST LUMPECTOMY Left 2000   positive for breast ca  . BREAST LUMPECTOMY Left 11/19/2020   NL with SN DCIS  . CESAREAN SECTION    . CHOLECYSTECTOMY    . COLONOSCOPY    . COLONOSCOPY WITH PROPOFOL N/A 07/04/2016   Procedure: COLONOSCOPY WITH PROPOFOL;  Surgeon: Lollie Sails, MD;  Location: [facility] ENDOSCOPY;  Service: Endoscopy;  Laterality: N/A;  . Left Wrist Biopsy    . PARTIAL MASTECTOMY WITH NEEDLE LOCALIZATION AND AXILLARY SENTINEL LYMPH NODE BX Left 11/19/2020   Procedure: PARTIAL MASTECTOMY WITH NEEDLE LOCALIZATION AND AXILLARY SENTINEL LYMPH NODE BX;  Surgeon: Herbert Pun, MD;  Location: [facility] ORS;  Service: General;  Laterality: Left;    SOCIAL HISTORY: Social History   Socioeconomic History  . Marital status: Married    Spouse name: Not on file  . Number of children: Not on file  . Years of education: Not on file  . Highest education level: Not on file  Occupational History  . Not on file  Tobacco Use  . Smoking status: Never  . Smokeless tobacco: Never  Vaping Use  . Vaping Use: Never used  Substance and Sexual Activity  . Alcohol use: Never  . Drug use: Never  . Sexual activity: Not on file  Other Topics Concern  . 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    Social Determinants of Health   Financial Resource Strain: Not on file  Food Insecurity: Not on file  Transportation Needs: Not on file  Physical Activity: Not on file  Stress: Not on file  Social Connections: Not on file  Intimate Partner Violence: Not on file    FAMILY HISTORY: Family History  Problem Relation Age of Onset  . Colon cancer Mother   . Breast cancer Maternal Aunt 65  . Breast cancer Paternal Grandmother 52    ALLERGIES:  is allergic to gantrisin [sulfisoxazole], sulfa antibiotics, and  tape.  MEDICATIONS:  Current Outpatient Medications  Medication Sig Dispense Refill  . aspirin 81 MG chewable tablet Chew 81 mg by mouth daily.    Marland Kitchen atorvastatin (LIPITOR) 20 MG tablet Take 20 mg by mouth at bedtime.    . Coenzyme Q10 100 MG capsule Take 100 mg by mouth daily.    . cyanocobalamin (,VITAMIN B-12,) 1000 MCG/ML injection Inject 1,000 mcg into the muscle every 30 (thirty) days.    . fexofenadine (ALLEGRA) 180 MG tablet Take 180 mg by mouth daily as needed for allergies or rhinitis.    . fluticasone (FLONASE) 50 MCG/ACT nasal spray Place 2 sprays into both nostrils daily as needed for rhinitis.    . Hylan 16 MG/2ML SOSY Inject 16 mcg into the articular space every 6 (six) months. Synvisc    . lisinopril (PRINIVIL,ZESTRIL) 20 MG tablet Take 20 mg by mouth daily.    . metoprolol tartrate (LOPRESSOR) 25 MG tablet Take 25 mg by mouth 2 (two) times daily.    . naproxen (  NAPROSYN) 500 MG tablet Take 500 mg by mouth 2 (two) times daily with a meal.    . nystatin cream (MYCOSTATIN) Apply 1 application topically daily as needed (Yeast).    . Vitamin D3 (VITAMIN D) 25 MCG tablet Take 1,000 Units by mouth daily.    Marland Kitchen anastrozole (ARIMIDEX) 1 MG tablet Take 1 tablet (1 mg total) by mouth daily. 30 tablet 3   No current facility-administered medications for this visit.      Marland Kitchen  PHYSICAL EXAMINATION: ECOG PERFORMANCE STATUS: 0 - Asymptomatic  Vitals:   07/19/21 1005  BP: (!) 129/59  Pulse: 66  Resp: 20  Temp: (!) 96.7 F (35.9 C)   Filed Weights   07/19/21 1005  Weight: 242 lb (109.8 kg)    Physical Exam HENT:     Head: Normocephalic and atraumatic.     Mouth/Throat:     Pharynx: No oropharyngeal exudate.  Eyes:     Pupils: Pupils are equal, round, and reactive to light.  Cardiovascular:     Rate and Rhythm: Normal rate and regular rhythm.  Pulmonary:     Effort: Pulmonary effort is normal. No respiratory distress.     Breath sounds: Normal breath sounds. No wheezing.   Abdominal:     General: Bowel sounds are normal. There is no distension.     Palpations: Abdomen is soft. There is no mass.     Tenderness: There is no abdominal tenderness. There is no guarding or rebound.  Musculoskeletal:        General: No tenderness. Normal range of motion.     Cervical back: Normal range of motion and neck supple.  Skin:    General: Skin is warm.     Comments: Erythema/warmth in the radiation portal left breast.  Neurological:     Mental Status: She is alert and oriented to person, place, and time.  Psychiatric:        Mood and Affect: Affect normal.     LABORATORY DATA:  I have reviewed the data as listed Lab Results  Component Value Date   WBC 4.0 01/21/2021   HGB 13.7 01/21/2021   HCT 40.7 01/21/2021   MCV 95.3 01/21/2021   PLT 205 01/21/2021   No results for input(s): NA, K, CL, CO2, GLUCOSE, BUN, CREATININE, CALCIUM, GFRNONAA, GFRAA, PROT, ALBUMIN, AST, ALT, ALKPHOS, BILITOT, BILIDIR, IBILI in the last 8760 hours.  RADIOGRAPHIC STUDIES: I have personally reviewed the radiological images as listed and agreed with the findings in the report. No results found.  ASSESSMENT & PLAN:   Ductal carcinoma in situ (DCIS) of left breast #Left breast DCIS ER POSitive- s/p Lumpectomy; s/p RT [finished April, 21, 2022]. Currently on anastrozole x 6 weeks; Tolerating poorly- leg swelling/ facial puffiness; hot flashes; joint aches; discontinued- symptoms- improved. See below.  # DISCONTINUE Anastrazole given the side effects.  Discussed regarding consideration for alternate AI-like Aromasin.  Given the patient has finished definitive treatment for the DCIS; and with upcoming holidays-I think is reasonable to hold off Aromasin for the next 3 months.  We will consider Aromasinin 3 months.   #Osteopenia : Worse- bone density [June-- 2022- T-score of -2.0.; June 2020 [T score -1.3]-  On Vit D 1000/day. [not calcium-] Discussed at length the risk factors [race,  post-menopausal status, use of AI]; prevention/treatment strategies- including but not limited to exercise calcium and vitamin D twice a day.  Patient taking Tums.  Recommend IV reclast. Discussed the potential side effects including but not  limited to- hypocalcemia and ONJ; discussed not to have invasive dental procedures few weeks around the time of the injections.  Patient is interested; however wants to take time to think about this.   # DISPOSITION: # follow in 3 months- MD; cbc/cmp;possible reclast--Dr.B   All questions were answered. The patient/family knows to call the clinic with any problems, questions or concerns.   Cammie Sickle, MD 07/19/2021 11:17 AM

## 2021-07-19 NOTE — Assessment & Plan Note (Addendum)
#  Left breast DCIS ER POSitive- s/p Lumpectomy; s/p RT [finished April, 21, 2022]. Currently on anastrozole x 6 weeks; Tolerating poorly- leg swelling/ facial puffiness; hot flashes; joint aches; discontinued- symptoms- improved. See below.  # DISCONTINUE Anastrazole given the side effects.  Discussed regarding consideration for alternate AI-like Aromasin.  Given the patient has finished definitive treatment for the DCIS; and with upcoming holidays-I think is reasonable to hold off Aromasin for the next 3 months.  We will consider Aromasinin 3 months.   #Osteopenia : Worse- bone density [June-- 2022- T-score of -2.0.; June 2020 [T score -1.3]-  On Vit D 1000/day. [not calcium-] Discussed at length the risk factors [race, post-menopausal status, use of AI]; prevention/treatment strategies- including but not limited to exercise calcium and vitamin D twice a day.  Patient taking Tums.  Recommend IV reclast. Discussed the potential side effects including but not limited to- hypocalcemia and ONJ; discussed not to have invasive dental procedures few weeks around the time of the injections.  Patient is interested; however wants to take time to think about this.   # DISPOSITION: # follow in 3 months- MD; cbc/cmp;possible reclast--Dr.B

## 2021-07-20 ENCOUNTER — Encounter: Payer: Self-pay | Admitting: *Deleted

## 2021-07-21 ENCOUNTER — Ambulatory Visit: Payer: Medicare Other | Admitting: Anesthesiology

## 2021-07-21 ENCOUNTER — Other Ambulatory Visit: Payer: Self-pay

## 2021-07-21 ENCOUNTER — Ambulatory Visit
Admission: RE | Admit: 2021-07-21 | Discharge: 2021-07-21 | Disposition: A | Discharge status: Home / Self Care | Payer: Medicare Other | Source: Ambulatory Visit | Attending: Gastroenterology | Admitting: Gastroenterology

## 2021-07-21 ENCOUNTER — Encounter
Admission: RE | Disposition: A | Discharge status: Home / Self Care | Payer: Self-pay | Source: Ambulatory Visit | Attending: Gastroenterology

## 2021-07-21 ENCOUNTER — Encounter: Payer: Self-pay | Admitting: *Deleted

## 2021-07-21 DIAGNOSIS — Z888 Allergy status to other drugs, medicaments and biological substances status: Secondary | ICD-10-CM | POA: Insufficient documentation

## 2021-07-21 DIAGNOSIS — Z800 Family history of malignant neoplasm of digestive organs: Secondary | ICD-10-CM | POA: Diagnosis not present

## 2021-07-21 DIAGNOSIS — E785 Hyperlipidemia, unspecified: Secondary | ICD-10-CM | POA: Insufficient documentation

## 2021-07-21 DIAGNOSIS — Z791 Long term (current) use of non-steroidal anti-inflammatories (NSAID): Secondary | ICD-10-CM | POA: Diagnosis not present

## 2021-07-21 DIAGNOSIS — Z1211 Encounter for screening for malignant neoplasm of colon: Secondary | ICD-10-CM | POA: Insufficient documentation

## 2021-07-21 DIAGNOSIS — Z7982 Long term (current) use of aspirin: Secondary | ICD-10-CM | POA: Diagnosis not present

## 2021-07-21 DIAGNOSIS — K589 Irritable bowel syndrome without diarrhea: Secondary | ICD-10-CM | POA: Insufficient documentation

## 2021-07-21 DIAGNOSIS — Z86010 Personal history of colonic polyps: Secondary | ICD-10-CM | POA: Insufficient documentation

## 2021-07-21 DIAGNOSIS — Z79899 Other long term (current) drug therapy: Secondary | ICD-10-CM | POA: Diagnosis not present

## 2021-07-21 DIAGNOSIS — K5730 Diverticulosis of large intestine without perforation or abscess without bleeding: Secondary | ICD-10-CM | POA: Diagnosis not present

## 2021-07-21 DIAGNOSIS — Z882 Allergy status to sulfonamides status: Secondary | ICD-10-CM | POA: Insufficient documentation

## 2021-07-21 DIAGNOSIS — E669 Obesity, unspecified: Secondary | ICD-10-CM | POA: Diagnosis not present

## 2021-07-21 DIAGNOSIS — K911 Postgastric surgery syndromes: Secondary | ICD-10-CM | POA: Diagnosis not present

## 2021-07-21 DIAGNOSIS — Z853 Personal history of malignant neoplasm of breast: Secondary | ICD-10-CM | POA: Diagnosis not present

## 2021-07-21 DIAGNOSIS — K640 First degree hemorrhoids: Secondary | ICD-10-CM | POA: Insufficient documentation

## 2021-07-21 DIAGNOSIS — I10 Essential (primary) hypertension: Secondary | ICD-10-CM | POA: Diagnosis not present

## 2021-07-21 HISTORY — PX: COLONOSCOPY WITH PROPOFOL: SHX5780

## 2021-07-21 SURGERY — COLONOSCOPY WITH PROPOFOL
Anesthesia: General

## 2021-07-21 MED ORDER — SODIUM CHLORIDE 0.9 % IV SOLN: INTRAVENOUS | Status: DC

## 2021-07-21 MED ORDER — LIDOCAINE HCL (PF) 2 % IJ SOLN
INTRAMUSCULAR | Status: DC | PRN
  Administered 2021-07-21: 60 mg via INTRADERMAL

## 2021-07-21 MED ORDER — PROPOFOL 10 MG/ML IV BOLUS
INTRAVENOUS | Status: DC | PRN
  Administered 2021-07-21: 80 mg via INTRAVENOUS

## 2021-07-21 MED ORDER — PROPOFOL 500 MG/50ML IV EMUL
INTRAVENOUS | Status: DC | PRN
  Administered 2021-07-21: 120 ug/kg/min via INTRAVENOUS

## 2021-07-21 NOTE — Transfer of Care (Signed)
Immediate Anesthesia Transfer of Care Note  Patient: Kelly Howell  Procedure(s) Performed: COLONOSCOPY WITH PROPOFOL  Patient Location: PACU and Endoscopy Unit  Anesthesia Type:General  Level of Consciousness: drowsy and patient cooperative  Airway & Oxygen Therapy: Patient Spontanous Breathing  Post-op Assessment: Report given to RN and Post -op Vital signs reviewed and stable  Post vital signs: Reviewed and stable  Last Vitals:  Vitals Value Taken Time  BP 89/51 07/21/21 1527  Temp 35.7 C 07/21/21 1527  Pulse 79 07/21/21 1531  Resp 17 07/21/21 1531  SpO2 100 % 07/21/21 1531  Vitals shown include unvalidated device data.  Last Pain:  Vitals:   07/21/21 1527  TempSrc: Temporal  PainSc: 0-No pain         Complications: No notable events documented.

## 2021-07-21 NOTE — Op Note (Signed)
[facility] Patient Name: Kelly Howell Procedure Date: 07/21/2021 2:45 PM MRN: [id number] Account #: [id number] Date of Birth: [birth year] Admit Type: Outpatient Age: 75 Room: [facility] ENDO ROOM 1 Gender: Female Note Status: Finalized Instrument Name: Park Meo [id number] Procedure:             Colonoscopy Indications:           Screening in patient at increased risk: Family history                         of 1st-degree relative with colorectal cancer before                         age 55 years Providers:             Rueben Bash, DO Referring MD:          Irven Easterly. Kary Kos, MD (Referring MD) Medicines:             Monitored Anesthesia Care Complications:         No immediate complications. Estimated blood loss: None. Procedure:             Pre-Anesthesia Assessment:                        - Prior to the procedure, a History and Physical was                         performed, and patient medications and allergies were                         reviewed. The patient is competent. The risks and                         benefits of the procedure and the sedation options and                         risks were discussed with the patient. All questions                         were answered and informed consent was obtained.                         Patient identification and proposed procedure were                         verified by the physician, the nurse, the anesthetist                         and the technician in the endoscopy suite. Mental                         Status Examination: alert and oriented. Airway                         Examination: normal oropharyngeal airway and neck                         mobility. Respiratory Examination: clear to  auscultation. CV Examination: RRR, no murmurs, no S3                         or S4. Prophylactic Antibiotics: The patient does not                         require prophylactic  antibiotics. Prior                         Anticoagulants: The patient has taken no previous                         anticoagulant or antiplatelet agents. ASA Grade                         Assessment: III - A patient with severe systemic                         disease. After reviewing the risks and benefits, the                         patient was deemed in satisfactory condition to                         undergo the procedure. The anesthesia plan was to use                         monitored anesthesia care (MAC). Immediately prior to                         administration of medications, the patient was                         re-assessed for adequacy to receive sedatives. The                         heart rate, respiratory rate, oxygen saturations,                         blood pressure, adequacy of pulmonary ventilation, and                         response to care were monitored throughout the                         procedure. The physical status of the patient was                         re-assessed after the procedure.                        After obtaining informed consent, the colonoscope was                         passed under direct vision. Throughout the procedure,                         the patient's blood pressure, pulse, and oxygen  saturations were monitored continuously. The                         Colonoscope was introduced through the anus and                         advanced to the the terminal ileum, with                         identification of the appendiceal orifice and IC                         valve. The colonoscopy was performed without                         difficulty. The patient tolerated the procedure well.                         The quality of the bowel preparation was evaluated                         using the BBPS [facility] Bowel Preparation Scale) with                         scores of: Right Colon = 3, Transverse Colon = 3  and                         Left Colon = 3 (entire mucosa seen well with no                         residual staining, small fragments of stool or opaque                         liquid). The total BBPS score equals 9. The terminal                         ileum, ileocecal valve, appendiceal orifice, and                         rectum were photographed. Findings:      The perianal and digital rectal examinations were normal. Pertinent       negatives include normal sphincter tone.      The terminal ileum appeared normal. Estimated blood loss: none.      Multiple small-mouthed diverticula were found in the recto-sigmoid colon       and sigmoid colon. Estimated blood loss: none.      Non-bleeding internal hemorrhoids were found during retroflexion. The       hemorrhoids were Grade I (internal hemorrhoids that do not prolapse).       Estimated blood loss: none.      The exam was otherwise without abnormality on direct and retroflexion       views. Impression:            - The examined portion of the ileum was normal.                        - Diverticulosis in the recto-sigmoid colon and in the  sigmoid colon.                        - Non-bleeding internal hemorrhoids.                        - The examination was otherwise normal on direct and                         retroflexion views.                        - No specimens collected. Recommendation:        - Discharge patient to home.                        - Resume previous diet.                        - Continue present medications.                        - Repeat colonoscopy in 5 years for surveillance                         versus discontinuation due to aging out of screening.                         Recommend shared decision making with patient.                        - Return to referring physician as previously                         scheduled. Procedure Code(s):     --- Professional ---                         M4680, Colorectal cancer screening; colonoscopy on                         individual at high risk Diagnosis Code(s):     --- Professional ---                        Z80.0, Family history of malignant neoplasm of                         digestive organs                        K64.0, First degree hemorrhoids                        K57.30, Diverticulosis of large intestine without                         perforation or abscess without bleeding CPT copyright 2019 American Medical Association. All rights reserved. The codes documented in this report are preliminary and upon coder review may  be revised to meet current compliance requirements. Attending Participation:      I personally performed the entire procedure. Volney American, DO Annamaria Helling DO, DO 07/21/2021 3:32:06 PM  This report has been signed electronically. Number of Addenda: 0 Note Initiated On: 07/21/2021 2:45 PM Scope Withdrawal Time: 0 hours 14 minutes 37 seconds  Total Procedure Duration: 0 hours 23 minutes 31 seconds  Estimated Blood Loss:  Estimated blood loss: none.      [facility]

## 2021-07-21 NOTE — Interval H&P Note (Signed)
History and Physical Interval Note: Preprocedure H&P from 07/21/21  was reviewed and there was no interval change after seeing and examining the patient.  Written consent was obtained from the patient after discussion of risks, benefits, and alternatives. Patient has consented to proceed with Colonoscopy with possible intervention   07/21/2021 2:51 PM  Kelly Howell  has presented today for surgery, with the diagnosis of FAM HX COLON CANCER.  The various methods of treatment have been discussed with the patient and family. After consideration of risks, benefits and other options for treatment, the patient has consented to  Procedure(s): COLONOSCOPY WITH PROPOFOL (N/A) as a surgical intervention.  The patient's history has been reviewed, patient examined, no change in status, stable for surgery.  I have reviewed the patient's chart and labs.  Questions were answered to the patient's satisfaction.     Annamaria Helling

## 2021-07-21 NOTE — Anesthesia Preprocedure Evaluation (Signed)
Anesthesia Evaluation  Patient identified by MRN, date of birth, ID band Patient awake    Reviewed: Allergy & Precautions, NPO status , Patient's Chart, lab work & pertinent test results  History of Anesthesia Complications Negative for: history of anesthetic complications  Airway Mallampati: III       Dental no notable dental hx.    Pulmonary neg sleep apnea, neg COPD, Not current smoker,    Pulmonary exam normal        Cardiovascular hypertension, Pt. on medications and Pt. on home beta blockers Normal cardiovascular exam+ dysrhythmias (palpitations )      Neuro/Psych neg Seizures  Neuromuscular disease    GI/Hepatic Neg liver ROS, neg GERD  ,  Endo/Other  neg diabetesMorbid obesity  Renal/GU negative Renal ROS     Musculoskeletal  (+) Arthritis , Osteoarthritis,    Abdominal (+) + obese,   Peds  Hematology   Anesthesia Other Findings Abnormal mammogram   Atypical hyperplasia of breast  Left Breast cancer ([place]) 2000 left lumpectomy  Colon polyps    Dumping syndrome    Dysrhythmia  pvc  Hyperlipidemia    Hypertension    IBS (irritable bowel syndrome)  Intermittent constipation   Intermittent diarrhea    Left sided lacunar infarction (HCC)  OA (osteoarthritis)    Obesity    Torus palatinus       Reproductive/Obstetrics                            Anesthesia Physical  Anesthesia Plan  ASA: 3  Anesthesia Plan: General   Post-op Pain Management:    Induction: Intravenous  PONV Risk Score and Plan: 2 and TIVA and Midazolam  Airway Management Planned: Natural Airway and Nasal Cannula  Additional Equipment:   Intra-op Plan:   Post-operative Plan:   Informed Consent: I have reviewed the patients History and Physical, chart, labs and discussed the procedure including the risks, benefits and alternatives for the proposed anesthesia with the patient or authorized  representative who has indicated his/her understanding and acceptance.       Plan Discussed with: CRNA, Anesthesiologist and Surgeon  Anesthesia Plan Comments:        Anesthesia Quick Evaluation

## 2021-07-21 NOTE — H&P (Signed)
[name] Pre-Procedure H&P   Patient ID: Kelly Howell is a 75 y.o. female.  Gastroenterology Provider: Annamaria Helling, DO  Referring Provider: Laurine Blazer, PA PCP: Maryland Pink, MD  Date: 07/21/2021  HPI Ms. Kelly Howell is a 75 y.o. female who presents today for Colonoscopy for surveillance colonoscopy- fhx colorectal cancer- mother (58y/o).  Pt denies any lower gi issues- denies hematochezia/melena, diarrhea, constipation. No weight loss appetite changes or night sweats.  No other acute gi complaints.  Past Medical History:  Diagnosis Date   Abnormal mammogram    Atypical hyperplasia of breast    Left   Breast cancer ([place]) 2000   left lumpectomy   Colon polyps    Dumping syndrome    Dysrhythmia    pvc   Hyperlipidemia    Hypertension    IBS (irritable bowel syndrome)    Intermittent constipation    Intermittent diarrhea    Left sided lacunar infarction (HCC)    OA (osteoarthritis)    Obesity    Torus palatinus     Past Surgical History:  Procedure Laterality Date   BREAST BIOPSY Left 2000   Positive   BREAST BIOPSY Left 11/03/2020   affirm bx ,  posterior group coil marker, DCIS NUCLEAR GRADE 2 WITH COMEDONECROSIS AND ASSOCIATED CALCIFICATIONS  ATYPICAL LOBULAR HYPERPLASIA   BREAST BIOPSY Left 11/03/2020   affirm bx, anterior group x marker, DCIS NUCLEAR GRADE 2 WITH COMEDONECROSIS AND ASSOCIATED CALCIFICATIONS  ATYPICAL LOBULAR HYPERPLASIA   BREAST LUMPECTOMY Left 2000   positive for breast ca   BREAST LUMPECTOMY Left 11/19/2020   NL with SN DCIS   CESAREAN SECTION     CHOLECYSTECTOMY     COLONOSCOPY     COLONOSCOPY WITH PROPOFOL N/A 07/04/2016   Procedure: COLONOSCOPY WITH PROPOFOL;  Surgeon: Lollie Sails, MD;  Location: [facility] ENDOSCOPY;  Service: Endoscopy;  Laterality: N/A;   Left Wrist Biopsy     PARTIAL MASTECTOMY WITH NEEDLE LOCALIZATION AND AXILLARY SENTINEL LYMPH NODE BX Left 11/19/2020   Procedure: PARTIAL MASTECTOMY  WITH NEEDLE LOCALIZATION AND AXILLARY SENTINEL LYMPH NODE BX;  Surgeon: Herbert Pun, MD;  Location: [facility] ORS;  Service: General;  Laterality: Left;    Family History Mother- crc- 70 y/o No other h/o GI disease or malignancy  Review of Systems  Constitutional:  Negative for activity change, appetite change, fatigue, fever and unexpected weight change.  HENT:  Negative for trouble swallowing and voice change.   Respiratory:  Negative for shortness of breath and wheezing.   Cardiovascular:  Negative for chest pain and palpitations.  Gastrointestinal:  Negative for abdominal distention, abdominal pain, anal bleeding, blood in stool, constipation, diarrhea, nausea, rectal pain and vomiting.  Musculoskeletal:  Negative for arthralgias and myalgias.  Skin:  Negative for color change and pallor.  Neurological:  Negative for dizziness, syncope and weakness.  Psychiatric/Behavioral:  Negative for confusion.   All other systems reviewed and are negative.   Medications No current facility-administered medications on file prior to encounter.   Current Outpatient Medications on File Prior to Encounter  Medication Sig Dispense Refill   anastrozole (ARIMIDEX) 1 MG tablet Take 1 tablet (1 mg total) by mouth daily. 30 tablet 3   aspirin 81 MG chewable tablet Chew 81 mg by mouth daily.     atorvastatin (LIPITOR) 20 MG tablet Take 20 mg by mouth at bedtime.     Coenzyme Q10 100 MG capsule Take 100 mg by mouth daily.     cyanocobalamin (,  VITAMIN B-12,) 1000 MCG/ML injection Inject 1,000 mcg into the muscle every 30 (thirty) days.     fexofenadine (ALLEGRA) 180 MG tablet Take 180 mg by mouth daily as needed for allergies or rhinitis.     fluticasone (FLONASE) 50 MCG/ACT nasal spray Place 2 sprays into both nostrils daily as needed for rhinitis.     Hylan 16 MG/2ML SOSY Inject 16 mcg into the articular space every 6 (six) months. Synvisc     lisinopril (PRINIVIL,ZESTRIL) 20 MG tablet Take 20 mg by  mouth daily.     metoprolol tartrate (LOPRESSOR) 25 MG tablet Take 25 mg by mouth 2 (two) times daily.     naproxen (NAPROSYN) 500 MG tablet Take 500 mg by mouth 2 (two) times daily with a meal.     nystatin cream (MYCOSTATIN) Apply 1 application topically daily as needed (Yeast).     Vitamin D3 (VITAMIN D) 25 MCG tablet Take 1,000 Units by mouth daily.      Pertinent medications related to GI and procedure were reviewed by me with the patient prior to the procedure   Current Facility-Administered Medications:    0.9 %  sodium chloride infusion, , Intravenous, Continuous, Annamaria Helling, DO, Last Rate: 20 mL/hr at 07/21/21 1424, New Bag at 07/21/21 1424  sodium chloride 20 mL/hr at 07/21/21 1424       Allergies  Allergen Reactions   Gantrisin [Sulfisoxazole] Nausea Only   Sulfa Antibiotics Nausea Only   Tape Other (See Comments)   Allergies were reviewed by me prior to the procedure  Objective    Vitals:   07/21/21 1422  BP: (!) 153/84  Pulse: 78  Resp: 20  Temp: (!) 97.4 F (36.3 C)  TempSrc: Temporal  SpO2: 99%  Weight: 110.7 kg  Height: 5\' 4"  (1.626 m)     Physical Exam Vitals reviewed.  Constitutional:      General: She is not in acute distress.    Appearance: Normal appearance. She is obese. She is not ill-appearing, toxic-appearing or diaphoretic.  HENT:     Head: Normocephalic and atraumatic.     Nose: Nose normal.     Mouth/Throat:     Mouth: Mucous membranes are moist.     Pharynx: Oropharynx is clear.  Eyes:     General: No scleral icterus.    Extraocular Movements: Extraocular movements intact.  Cardiovascular:     Rate and Rhythm: Normal rate and regular rhythm.     Heart sounds: Normal heart sounds. No murmur heard.   No friction rub. No gallop.  Pulmonary:     Effort: Pulmonary effort is normal. No respiratory distress.     Breath sounds: Normal breath sounds. No wheezing, rhonchi or rales.  Abdominal:     General: Bowel sounds are  normal. There is no distension.     Palpations: Abdomen is soft.     Tenderness: There is no abdominal tenderness. There is no guarding or rebound.  Musculoskeletal:     Cervical back: Neck supple.     Right lower leg: No edema.     Left lower leg: No edema.  Skin:    General: Skin is warm and dry.     Coloration: Skin is not jaundiced or pale.  Neurological:     General: No focal deficit present.     Mental Status: She is alert and oriented to person, place, and time. Mental status is at baseline.  Psychiatric:        Mood and  Affect: Mood normal.        Behavior: Behavior normal.        Thought Content: Thought content normal.        Judgment: Judgment normal.     Assessment:  Ms. Kelly Howell is a 75 y.o. female  who presents today for Colonoscopy for surveillance colonoscopy- fhx colorectal cancer- mother (58y/o).  Plan:  Colonoscopy with possible intervention today  Colonoscopy with possible biopsy, control of bleeding, polypectomy, and interventions as necessary has been discussed with the patient/patient representative. Informed consent was obtained from the patient/patient representative after explaining the indication, nature, and risks of the procedure including but not limited to death, bleeding, perforation, missed neoplasm/lesions, cardiorespiratory compromise, and reaction to medications. Opportunity for questions was given and appropriate answers were provided. Patient/patient representative has verbalized understanding is amenable to undergoing the procedure.   Annamaria Helling, DO  [facility]  Portions of the record may have been created with voice recognition software. Occasional wrong-word or 'sound-a-like' substitutions may have occurred due to the inherent limitations of voice recognition software.  Read the chart carefully and recognize, using context, where substitutions may have occurred.

## 2021-07-22 ENCOUNTER — Encounter: Payer: Self-pay | Admitting: Gastroenterology

## 2021-07-22 NOTE — Anesthesia Postprocedure Evaluation (Signed)
Anesthesia Post Note  Patient: Kelly Howell  Procedure(s) Performed: COLONOSCOPY WITH PROPOFOL  Patient location during evaluation: Endoscopy Anesthesia Type: General Level of consciousness: awake and alert Pain management: pain level controlled Vital Signs Assessment: post-procedure vital signs reviewed and stable Respiratory status: spontaneous breathing, nonlabored ventilation, respiratory function stable and patient connected to nasal cannula oxygen Cardiovascular status: blood pressure returned to baseline and stable Postop Assessment: no apparent nausea or vomiting Anesthetic complications: no   No notable events documented.   Last Vitals:  Vitals:   07/21/21 1422 07/21/21 1527  BP: (!) 153/84 (!) 89/51  Pulse: 78 75  Resp: 20 17  Temp: (!) 36.3 C (!) 35.7 C  SpO2: 99% 100%    Last Pain:  Vitals:   07/21/21 1547  TempSrc:   PainSc: 0-No pain                 Martha Clan

## 2021-08-03 ENCOUNTER — Encounter: Payer: Self-pay | Admitting: Radiation Oncology

## 2021-08-03 ENCOUNTER — Ambulatory Visit
Admission: RE | Admit: 2021-08-03 | Discharge: 2021-08-03 | Disposition: A | Discharge status: Home / Self Care | Payer: Medicare Other | Source: Ambulatory Visit | Attending: Radiation Oncology | Admitting: Radiation Oncology

## 2021-08-03 ENCOUNTER — Other Ambulatory Visit: Payer: Self-pay

## 2021-08-03 VITALS — BP 165/78 | HR 59 | Temp 97.4°F | Resp 16 | Wt 248.0 lb

## 2021-08-03 DIAGNOSIS — Z79811 Long term (current) use of aromatase inhibitors: Secondary | ICD-10-CM | POA: Diagnosis not present

## 2021-08-03 DIAGNOSIS — Z170 Estrogen receptor positive status [ER+]: Secondary | ICD-10-CM | POA: Insufficient documentation

## 2021-08-03 DIAGNOSIS — D0512 Intraductal carcinoma in situ of left breast: Secondary | ICD-10-CM | POA: Insufficient documentation

## 2021-08-03 NOTE — Progress Notes (Signed)
Survivorship Care Plan visit completed.  Treatment summary reviewed and given to patient.  ASCO answers booklet reviewed and given to patient.  CARE program and Cancer Transitions discussed with patient along with other resources cancer center offers to patients and caregivers.  Patient verbalized understanding.    

## 2021-08-03 NOTE — Progress Notes (Signed)
Radiation Oncology Follow up Note  Name: Kelly Howell   Date:   08/03/2021 MRN:  [id number] DOB: 03-31-1946    This 75 y.o. 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  Seen today in routine follow-up she is doing well.  She specifically denies breast tenderness cough or bone pain.  Still has occasional some tenderness in her left axilla most likely related to scarring..  She has discontinued Arimidex we will reevaluate that with Dr. B at next visit she has had some significant side effects.  She has not had a follow-up mammogram.  COMPLICATIONS OF TREATMENT: none  FOLLOW UP COMPLIANCE: keeps appointments   PHYSICAL EXAM:  BP (!) 165/78   Pulse (!) 59   Temp (!) 97.4 F (36.3 C) (Tympanic)   Resp 16   Wt 248 lb (112.5 kg)   BMI 42.57 kg/m  Lungs are clear to A&P cardiac examination essentially unremarkable with regular rate and rhythm. No dominant mass or nodularity is noted in either breast in 2 positions examined. Incision is well-healed. No axillary or supraclavicular adenopathy is appreciated. Cosmetic result is excellent.  Well-developed well-nourished patient in NAD. HEENT reveals PERLA, EOMI, discs not visualized.  Oral cavity is clear. No oral mucosal lesions are identified. Neck is clear without evidence of cervical or supraclavicular adenopathy. Lungs are clear to A&P. Cardiac examination is essentially unremarkable with regular rate and rhythm without murmur rub or thrill. Abdomen is benign with no organomegaly or masses noted. Motor sensory and DTR levels are equal and symmetric in the upper and lower extremities. Cranial nerves II through XII are grossly intact.  Proprioception is intact. No peripheral adenopathy or edema is identified. No motor or sensory levels are noted. Crude visual fields are within normal range.  RADIOLOGY RESULTS: No current films for review  PLAN: Present time patient is doing well 6 months out with very low side effect profile.  I am pleased with her overall progress.  I have asked to see her back in 6 months for follow-up.Marland Kitchen  Anticipate a mammogram at that time.  Patient knows to call with any concerns at any time.  I would like to take this opportunity to thank you for allowing me to participate in the care of your patient.Noreene Filbert, MD

## 2021-08-10 ENCOUNTER — Other Ambulatory Visit: Payer: Self-pay | Admitting: General Surgery

## 2021-08-10 DIAGNOSIS — Z853 Personal history of malignant neoplasm of breast: Secondary | ICD-10-CM

## 2021-09-09 ENCOUNTER — Other Ambulatory Visit: Payer: Medicare Other

## 2021-09-26 ENCOUNTER — Ambulatory Visit
Admission: RE | Admit: 2021-09-26 | Discharge: 2021-09-26 | Disposition: A | Discharge status: Home / Self Care | Payer: Medicare Other | Source: Ambulatory Visit | Attending: General Surgery | Admitting: General Surgery

## 2021-09-26 ENCOUNTER — Other Ambulatory Visit: Payer: Self-pay

## 2021-09-26 DIAGNOSIS — Z853 Personal history of malignant neoplasm of breast: Secondary | ICD-10-CM | POA: Insufficient documentation

## 2021-09-26 HISTORY — DX: Personal history of irradiation: Z92.3

## 2021-09-27 ENCOUNTER — Other Ambulatory Visit: Payer: Self-pay | Admitting: General Surgery

## 2021-09-27 DIAGNOSIS — R928 Other abnormal and inconclusive findings on diagnostic imaging of breast: Secondary | ICD-10-CM

## 2021-09-27 DIAGNOSIS — N6310 Unspecified lump in the right breast, unspecified quadrant: Secondary | ICD-10-CM

## 2021-10-04 ENCOUNTER — Ambulatory Visit
Admission: RE | Admit: 2021-10-04 | Discharge: 2021-10-04 | Disposition: A | Discharge status: Home / Self Care | Payer: Medicare Other | Source: Ambulatory Visit | Attending: General Surgery | Admitting: General Surgery

## 2021-10-04 ENCOUNTER — Other Ambulatory Visit: Payer: Self-pay

## 2021-10-04 DIAGNOSIS — N6310 Unspecified lump in the right breast, unspecified quadrant: Secondary | ICD-10-CM | POA: Insufficient documentation

## 2021-10-04 DIAGNOSIS — R928 Other abnormal and inconclusive findings on diagnostic imaging of breast: Secondary | ICD-10-CM

## 2021-10-04 HISTORY — PX: BREAST BIOPSY: SHX20

## 2021-10-05 ENCOUNTER — Encounter: Payer: Self-pay | Admitting: *Deleted

## 2021-10-05 NOTE — Progress Notes (Signed)
Received message from Electa Sniff, RN that patient had been notified of her positive diagnosis of invasive breast and was ready for navigation to contact her.  Patient already being seen by Dr. Rogue Bussing for a history of DCIS.  Dr. Rogue Bussing notified of patient's diagnosis.  He said he would see her at already scheduled appointment on 10/18/21.  Called patient to establish navigation services.  Patient is scheduled to see Dr. Peyton Najjar on 10/13/21 and informed Dr. B would discuss her new diagnosis at her next appointment.  Patient being set up for an MRI.  She is to call with any questions or needs.

## 2021-10-06 ENCOUNTER — Other Ambulatory Visit: Payer: Self-pay | Admitting: General Surgery

## 2021-10-06 DIAGNOSIS — C50211 Malignant neoplasm of upper-inner quadrant of right female breast: Secondary | ICD-10-CM

## 2021-10-12 ENCOUNTER — Other Ambulatory Visit: Payer: Self-pay | Admitting: *Deleted

## 2021-10-12 DIAGNOSIS — D0512 Intraductal carcinoma in situ of left breast: Secondary | ICD-10-CM

## 2021-10-12 DIAGNOSIS — M810 Age-related osteoporosis without current pathological fracture: Secondary | ICD-10-CM

## 2021-10-12 LAB — SURGICAL PATHOLOGY

## 2021-10-13 ENCOUNTER — Other Ambulatory Visit: Payer: Self-pay

## 2021-10-13 ENCOUNTER — Ambulatory Visit
Admission: RE | Admit: 2021-10-13 | Discharge: 2021-10-13 | Disposition: A | Discharge status: Home / Self Care | Payer: Medicare Other | Source: Ambulatory Visit | Attending: General Surgery | Admitting: General Surgery

## 2021-10-13 DIAGNOSIS — C50211 Malignant neoplasm of upper-inner quadrant of right female breast: Secondary | ICD-10-CM | POA: Insufficient documentation

## 2021-10-13 MED ORDER — GADOBUTROL 1 MMOL/ML IV SOLN
10.0000 mL | Freq: Once | INTRAVENOUS | Status: AC | PRN
  Administered 2021-10-13: 10 mL via INTRAVENOUS

## 2021-10-14 ENCOUNTER — Other Ambulatory Visit: Payer: Self-pay | Admitting: General Surgery

## 2021-10-18 ENCOUNTER — Encounter: Payer: Self-pay | Admitting: *Deleted

## 2021-10-18 ENCOUNTER — Other Ambulatory Visit: Payer: Self-pay

## 2021-10-18 ENCOUNTER — Encounter: Payer: Self-pay | Admitting: Internal Medicine

## 2021-10-18 ENCOUNTER — Inpatient Hospital Stay: Payer: Medicare Other

## 2021-10-18 ENCOUNTER — Inpatient Hospital Stay (HOSPITAL_BASED_OUTPATIENT_CLINIC_OR_DEPARTMENT_OTHER): Payer: Medicare Other | Admitting: Internal Medicine

## 2021-10-18 ENCOUNTER — Inpatient Hospital Stay: Payer: Medicare Other | Attending: Internal Medicine

## 2021-10-18 DIAGNOSIS — C50811 Malignant neoplasm of overlapping sites of right female breast: Secondary | ICD-10-CM

## 2021-10-18 DIAGNOSIS — Z170 Estrogen receptor positive status [ER+]: Secondary | ICD-10-CM | POA: Diagnosis not present

## 2021-10-18 DIAGNOSIS — M810 Age-related osteoporosis without current pathological fracture: Secondary | ICD-10-CM

## 2021-10-18 DIAGNOSIS — D0512 Intraductal carcinoma in situ of left breast: Secondary | ICD-10-CM

## 2021-10-18 DIAGNOSIS — M8580 Other specified disorders of bone density and structure, unspecified site: Secondary | ICD-10-CM | POA: Insufficient documentation

## 2021-10-18 LAB — CBC WITH DIFFERENTIAL/PLATELET
Abs Immature Granulocytes: 0.01 10*3/uL (ref 0.00–0.07)
Basophils Absolute: 0 10*3/uL (ref 0.0–0.1)
Basophils Relative: 1 %
Eosinophils Absolute: 0.1 10*3/uL (ref 0.0–0.5)
Eosinophils Relative: 2 %
HCT: 37.8 % (ref 36.0–46.0)
Hemoglobin: 12.9 g/dL (ref 12.0–15.0)
Immature Granulocytes: 0 %
Lymphocytes Relative: 20 %
Lymphs Abs: 1.1 10*3/uL (ref 0.7–4.0)
MCH: 33.3 pg (ref 26.0–34.0)
MCHC: 34.1 g/dL (ref 30.0–36.0)
MCV: 97.7 fL (ref 80.0–100.0)
Monocytes Absolute: 0.5 10*3/uL (ref 0.1–1.0)
Monocytes Relative: 9 %
Neutro Abs: 3.9 10*3/uL (ref 1.7–7.7)
Neutrophils Relative %: 68 %
Platelets: 244 10*3/uL (ref 150–400)
RBC: 3.87 MIL/uL (ref 3.87–5.11)
RDW: 12.9 % (ref 11.5–15.5)
WBC: 5.7 10*3/uL (ref 4.0–10.5)
nRBC: 0 % (ref 0.0–0.2)

## 2021-10-18 LAB — COMPREHENSIVE METABOLIC PANEL
ALT: 17 U/L (ref 0–44)
AST: 19 U/L (ref 15–41)
Albumin: 3.6 g/dL (ref 3.5–5.0)
Alkaline Phosphatase: 75 U/L (ref 38–126)
Anion gap: 6 (ref 5–15)
BUN: 29 mg/dL — ABNORMAL HIGH (ref 8–23)
CO2: 23 mmol/L (ref 22–32)
Calcium: 8.7 mg/dL — ABNORMAL LOW (ref 8.9–10.3)
Chloride: 108 mmol/L (ref 98–111)
Creatinine, Ser: 1 mg/dL (ref 0.44–1.00)
GFR, Estimated: 59 mL/min — ABNORMAL LOW (ref >60)
Glucose, Bld: 118 mg/dL — ABNORMAL HIGH (ref 70–99)
Potassium: 3.9 mmol/L (ref 3.5–5.1)
Sodium: 137 mmol/L (ref 135–145)
Total Bilirubin: 0.7 mg/dL (ref 0.3–1.2)
Total Protein: 6.5 g/dL (ref 6.5–8.1)

## 2021-10-18 NOTE — Progress Notes (Signed)
Met patient during her appointment with Dr. Rogue Bussing.  She is well established with him. Patient had breast cancer of the left breast in February of 2022.  She had a lumpectomy at that time with radiation therapy.  She could not tolerate antihormonal therapy at that time.  Dr. Rogue Bussing has recommended for her to move forward with surgery and he will follow up with her 2 weeks after surgery.  Patient encouraged to call me with her surgery date and I will get her scheduled.  She is agreeable.

## 2021-10-18 NOTE — Progress Notes (Signed)
Patient here for f/u of abnormal mammogram.

## 2021-10-18 NOTE — Assessment & Plan Note (Addendum)
#  status post biopsy RIGHT BREAST Early stage  ER/PR POSITIVE; Her2 NEGATIVE.  MRI January 2023-shows small cluster [subcentimeter x3] of concerning areas in the right breast-biopsy-proven malignancy.  Awaiting surgery.  #Proceed with surgery; possibly lumpectomy is planned.  I have messaged Dr. Peyton Najjar.  Discussed the patient will benefit from radiation postsurgery.  Clinically, less likely to need chemotherapy.  I again reviewed the importance of antiestrogen therapy post radiation.  Patient had severe intolerance to anastrozole [started on left DCIS].  I would recommend Aromasin.   #Osteopenia : Worse- bone density [June-- 2022- T-score of -2.0.; June 2020 [T score -1.3]-  On Vit D 1000/day. [not calcium-] for weight loss today; given upcoming surgery.  We will reschedule again in near future.  # DISPOSITION: # HOLD reclast # follow up post surgery- Dr.B

## 2021-10-18 NOTE — Progress Notes (Signed)
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  MRI January 2023-shows small cluster [subcentimeter x3]   ---------------  BREAST, LEFT; EXCISION:  - DUCTAL CARCINOMA IN SITU, INTERMEDIATE GRADE, WITH COMEDONECROSIS AND  ASSOCIATED CALCIFICATIONS.  - ASSOCIATED ATYPICAL LOBULAR HYPERPLASIA.  - TWO CLIPS AND TWO BIOPSY SITES IDENTIFIED.  - NO EVIDENCE OF INVASIVE CARCINOMA.  - SEE CANCER SUMMARY.   B. LYMPH NODE, LEFT AXILLARY SENTINEL; EXCISION:  - ONE LYMPH NODE, NEGATIVE FOR MALIGNANCY (0/1).   # AUG, 3rd 2022-anastrozole x6 weeks; STOP SEP 2022 [sec to [facility]]   # SURVIVORSHIP:   # GENETICS:   DIAGNOSIS:   STAGE:         ;  GOALS:  CURRENT/MOST RECENT THERAPY :     Ductal carcinoma in situ (DCIS) of left breast  11/09/2020 Initial Diagnosis   Ductal carcinoma in situ (DCIS) of left breast   12/03/2020 Cancer Staging   Staging form: Breast, AJCC 8th Edition - Clinical: Stage 0 (cTis (DCIS), cN0, cM0, G2, ER+) - Signed by Cammie Sickle, MD on 12/03/2020 Stage prefix: Initial diagnosis    Carcinoma of overlapping sites of right breast in female, estrogen receptor positive ([place])   10/18/2021 Initial Diagnosis   Carcinoma of overlapping sites of right breast in female, estrogen receptor positive ([place])      HISTORY OF PRESENTING ILLNESS: Ambulating with wheelchair.  Alone.  Kelly Howell 76 y.o.  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  Patient had a screening mammogram that showed areas of concerns in the right breast.  This was followed by a biopsy-positive for malignancy.  Followed by MRI.  She is awaiting discuss surgical options with Dr. Peyton Najjar.  Since stopping-pain has resolved patient symptoms have improved.  She is back to baseline.  Review of Systems  Constitutional:  Negative for chills, diaphoresis, fever and weight loss.  HENT:  Negative for nosebleeds and sore throat.   Eyes:  Negative for double vision.  Respiratory:  Negative for cough, hemoptysis, sputum production, shortness of breath and wheezing.   Cardiovascular:  Negative for chest pain, palpitations and orthopnea.  Gastrointestinal:  Negative for abdominal pain, blood in stool, constipation, diarrhea, heartburn, melena, nausea and vomiting.  Genitourinary:  Negative for dysuria, frequency and urgency.  Musculoskeletal:  Positive for back pain and joint pain.  Skin:  Negative for itching.  Neurological:  Negative for dizziness, tingling, focal weakness, weakness and headaches.  Endo/Heme/Allergies:  Does not bruise/bleed easily.  Psychiatric/Behavioral:  Negative for depression. The patient is not nervous/anxious and does not have insomnia.     MEDICAL HISTORY:  Past Medical History:  Diagnosis Date   Abnormal mammogram    Atypical hyperplasia of breast  Left   Breast cancer ([place]) 2000   left lumpectomy   Colon polyps    Dumping syndrome    Dysrhythmia    pvc   Hyperlipidemia    Hypertension    IBS (irritable bowel syndrome)    Intermittent constipation    Intermittent diarrhea     Left sided lacunar infarction (H[facility])    OA (osteoarthritis)    Obesity    Personal history of radiation therapy    Torus palatinus     SURGICAL HISTORY: Past Surgical History:  Procedure Laterality Date   BREAST BIOPSY Left 2000   Positive   BREAST BIOPSY Left 11/03/2020   affirm bx ,  posterior group coil marker, DCIS NUCLEAR GRADE 2 WITH COMEDONECROSIS AND ASSOCIATED CALCIFICATIONS  ATYPICAL LOBULAR HYPERPLASIA   BREAST BIOPSY Left 11/03/2020   affirm bx, anterior group x marker, DCIS NUCLEAR GRADE 2 WITH COMEDONECROSIS AND ASSOCIATED CALCIFICATIONS  ATYPICAL LOBULAR HYPERPLASIA   BREAST BIOPSY Right 10/04/2021   Stereo Bx, X-clip, path pending   BREAST LUMPECTOMY Left 2000   positive for breast ca   BREAST LUMPECTOMY Left 11/19/2020   NL with SN DCIS   CESAREAN SECTION     CHOLECYSTECTOMY     COLONOSCOPY     COLONOSCOPY WITH PROPOFOL N/A 07/04/2016   Procedure: COLONOSCOPY WITH PROPOFOL;  Surgeon: Lollie Sails, MD;  Location: [facility] ENDOSCOPY;  Service: Endoscopy;  Laterality: N/A;   COLONOSCOPY WITH PROPOFOL N/A 07/21/2021   Procedure: COLONOSCOPY WITH PROPOFOL;  Surgeon: Annamaria Helling, DO;  Location: [facility] ENDOSCOPY;  Service: Gastroenterology;  Laterality: N/A;   Left Wrist Biopsy     PARTIAL MASTECTOMY WITH NEEDLE LOCALIZATION AND AXILLARY SENTINEL LYMPH NODE BX Left 11/19/2020   Procedure: PARTIAL MASTECTOMY WITH NEEDLE LOCALIZATION AND AXILLARY SENTINEL LYMPH NODE BX;  Surgeon: Herbert Pun, MD;  Location: [facility] ORS;  Service: General;  Laterality: Left;    SOCIAL HISTORY: Social History   Socioeconomic History   Marital status: Married    Spouse name: Not on file   Number of children: Not on file   Years of education: Not on file   Highest education level: Not on file  Occupational History   Not on file  Tobacco Use   Smoking status: Never   Smokeless tobacco: Never  Vaping Use   Vaping Use: Never used  Substance and Sexual Activity    Alcohol use: Never   Drug use: Never   Sexual activity: Not on file  Other Topics Concern   Not on file  Social History Narrative   Lives in [place]. 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    Social Determinants of Health   Financial Resource Strain: Not on file  Food Insecurity: Not on file  Transportation Needs: Not on file  Physical Activity: Not on file  Stress: Not on file  Social Connections: Not on file  Intimate Partner Violence: Not on file    FAMILY HISTORY: Family History  Problem Relation Age of Onset   Colon cancer Mother    Breast cancer Maternal Aunt 65   Breast cancer Paternal Grandmother 76    ALLERGIES:  is allergic to gantrisin [sulfisoxazole], sulfa antibiotics, and tape.  MEDICATIONS:  Current Outpatient Medications  Medication Sig Dispense Refill   aspirin 81 MG chewable tablet Chew 81 mg by mouth daily.     atorvastatin (LIPITOR) 20 MG tablet Take 20 mg  by mouth at bedtime.     Coenzyme Q10 100 MG capsule Take 100 mg by mouth daily.     cyanocobalamin (,VITAMIN B-12,) 1000 MCG/ML injection Inject 1,000 mcg into the muscle every 30 (thirty) days.     fexofenadine (ALLEGRA) 180 MG tablet Take 180 mg by mouth daily as needed for allergies or rhinitis.     fluticasone (FLONASE) 50 MCG/ACT nasal spray Place 2 sprays into both nostrils daily as needed for rhinitis.     lisinopril (PRINIVIL,ZESTRIL) 20 MG tablet Take 20 mg by mouth daily.     metoprolol tartrate (LOPRESSOR) 25 MG tablet Take 25 mg by mouth 2 (two) times daily.     naproxen (NAPROSYN) 500 MG tablet Take 500 mg by mouth 2 (two) times daily with a meal.     nystatin cream (MYCOSTATIN) Apply 1 application topically daily as needed (Yeast).     Vitamin D3 (VITAMIN D) 25 MCG tablet Take 1,000 Units by mouth daily.     anastrozole (ARIMIDEX) 1 MG tablet Take 1 tablet (1 mg total) by mouth daily. (Patient not  taking: Reported on 08/03/2021) 30 tablet 3   Hylan 16 MG/2ML SOSY Inject 16 mcg into the articular space every 6 (six) months. Synvisc (Patient not taking: Reported on 10/18/2021)     No current facility-administered medications for this visit.      Marland Kitchen  PHYSICAL EXAMINATION: ECOG PERFORMANCE STATUS: 0 - Asymptomatic  Vitals:   10/18/21 1320  BP: 125/63  Pulse: 72  Resp: 16  Temp: 98.5 F (36.9 C)   Filed Weights   10/18/21 1320  Weight: 242 lb 12.8 oz (110.1 kg)    Physical Exam HENT:     Head: Normocephalic and atraumatic.     Mouth/Throat:     Pharynx: No oropharyngeal exudate.  Eyes:     Pupils: Pupils are equal, round, and reactive to light.  Cardiovascular:     Rate and Rhythm: Normal rate and regular rhythm.  Pulmonary:     Effort: Pulmonary effort is normal. No respiratory distress.     Breath sounds: Normal breath sounds. No wheezing.  Abdominal:     General: Bowel sounds are normal. There is no distension.     Palpations: Abdomen is soft. There is no mass.     Tenderness: There is no abdominal tenderness. There is no guarding or rebound.  Musculoskeletal:        General: No tenderness. Normal range of motion.     Cervical back: Normal range of motion and neck supple.  Skin:    General: Skin is warm.  Neurological:     Mental Status: She is alert and oriented to person, place, and time.  Psychiatric:        Mood and Affect: Affect normal.     LABORATORY DATA:  I have reviewed the data as listed Lab Results  Component Value Date   WBC 5.7 10/18/2021   HGB 12.9 10/18/2021   HCT 37.8 10/18/2021   MCV 97.7 10/18/2021   PLT 244 10/18/2021   Recent Labs    10/18/21 1302  NA 137  K 3.9  CL 108  CO2 23  GLUCOSE 118*  BUN 29*  CREATININE 1.00  CALCIUM 8.7*  GFRNONAA 59*  PROT 6.5  ALBUMIN 3.6  AST 19  ALT 17  ALKPHOS 75  BILITOT 0.7    RADIOGRAPHIC STUDIES: I have personally reviewed the radiological images as listed and agreed with  the findings in the report. MR  BREAST BILATERAL W WO CONTRAST INC CAD  Result Date: 10/13/2021 CLINICAL DATA:  Left lumpectomy February 2022 followed by radiation therapy for left breast DCIS with clear margins and negative nodes. On the September 26, 2021 diagnostic mammogram, a small spiculated 5 mm mass was identified in the 1 o'clock position of the right breast. This mass was biopsied demonstrating invasive mammary carcinoma with lobular features. Evaluate extent of disease. EXAM: BILATERAL BREAST MRI WITH AND WITHOUT CONTRAST TECHNIQUE: Multiplanar, multisequence MR images of both breasts were obtained prior to and following the intravenous administration of 10 ml of Gadavist Three-dimensional MR images were rendered by post-processing of the original MR data on an independent workstation. The three-dimensional MR images were interpreted, and findings are reported in the following complete MRI report for this study. Three dimensional images were evaluated at the independent interpreting workstation using the DynaCAD thin client. COMPARISON:  Recent mammography FINDINGS: Breast composition: b. Scattered fibroglandular tissue. Background parenchymal enhancement: Mild Right breast: There is hematoma and mild enhancement along the biopsy track associated with the recently diagnosed right-sided malignancy. A discrete enhancing mass is not seen along the biopsy tract. Just posterior and inferior to the new right-sided malignancy are 3 tiny masses best seen on series 14, images 48 and 45. The most anterior of these masses measures 6 mm. The 2 most posterior of these masses are immediately adjacent to each other measuring less than 5 mm each. The distance between the most anterior of the 3 masses and the 2 adjacent more posterior masses is 1.1 cm. No other abnormalities are identified in the right breast. Left breast: Post radiation and lumpectomy changes seen on the left with a left-sided seroma. No suspicious  findings on the left. Lymph nodes: No abnormal appearing lymph nodes. Ancillary findings:  None. IMPRESSION: 1. Post biopsy changes are seen in the region of the recently diagnosed new right breast cancer. No discrete enhancing mass identified along the biopsy tract. 2. Three tiny masses are seen immediately posterior and inferior to the new malignancy on the right. See above for details. 3. Post lumpectomy and radiation changes on the left. No suspicious findings on the left. 4. No adenopathy. RECOMMENDATION: Recommend MRI guided biopsy of the masses just posterior and inferior to the new malignancy on the right. Recommend biopsying the largest, most anterior of these masses. If the posterior aspect of this mass is targeted, I believe all 3 masses can be targeted with 1 biopsy. BI-RADS CATEGORY  4: Suspicious. Electronically Signed   By: Dorise Bullion III M.D.   On: 10/13/2021 15:37  US BREAST LTD UNI RIGHT INC AXILLA  Result Date: 09/26/2021 CLINICAL DATA:  Status post left lumpectomy in February 2022 followed by radiation therapy for left breast DCIS with clear margins and negative lymph nodes. She is not currently taking anastrozole due to side effects. EXAM: DIGITAL DIAGNOSTIC BILATERAL MAMMOGRAM WITH TOMOSYNTHESIS AND CAD; ULTRASOUND RIGHT BREAST LIMITED TECHNIQUE: Bilateral digital diagnostic mammography and breast tomosynthesis was performed. The images were evaluated with computer-aided detection.; Targeted ultrasound examination of the right breast was performed COMPARISON:  Previous exam(s). ACR Breast Density Category b: There are scattered areas of fibroglandular density. FINDINGS: Interval small, spiculated mass-like density in the mid right breast slightly medially in the craniocaudal projection, not seen in the true lateral or oblique projection. Expected post lumpectomy and postradiation changes on the left. On physical exam, no mass is palpable in the medial right breast. Targeted ultrasound  is performed, showing a 5 x  4 x 4 mm triangular shaped hypoechoic area with posterior acoustical shadowing in the 1 o'clock position of the right breast, 3 cm from the nipple. This was difficult to find and reproduced at real-time. This most likely corresponds to the spiculated mass-like density in the mid right breast slightly medially. Ultrasound of the right axilla demonstrated normal appearing right axillary lymph nodes. IMPRESSION: Small, spiculated mass-like density in the mid right breast slightly medially with a possible 5 mm ultrasound correlate in the 1 o'clock position, 3 cm from the nipple. On today's images, this is best seen and reproducible on the 3D craniocaudal mammogram images. RECOMMENDATION: 3D stereotactic guided core needle biopsy of the small, spiculated, mass-like density in the mid right breast slightly medially. This has been discussed with the patient and she will be assisted in getting the biopsy scheduled. I have discussed the findings and recommendations with the patient. If applicable, a reminder letter will be sent to the patient regarding the next appointment. BI-RADS CATEGORY  4: Suspicious. Electronically Signed   By: Claudie Revering M.D.   On: 09/26/2021 16:46  MM DIAG BREAST TOMO BILATERAL  Result Date: 09/26/2021 CLINICAL DATA:  Status post left lumpectomy in February 2022 followed by radiation therapy for left breast DCIS with clear margins and negative lymph nodes. She is not currently taking anastrozole due to side effects. EXAM: DIGITAL DIAGNOSTIC BILATERAL MAMMOGRAM WITH TOMOSYNTHESIS AND CAD; ULTRASOUND RIGHT BREAST LIMITED TECHNIQUE: Bilateral digital diagnostic mammography and breast tomosynthesis was performed. The images were evaluated with computer-aided detection.; Targeted ultrasound examination of the right breast was performed COMPARISON:  Previous exam(s). ACR Breast Density Category b: There are scattered areas of fibroglandular density. FINDINGS: Interval  small, spiculated mass-like density in the mid right breast slightly medially in the craniocaudal projection, not seen in the true lateral or oblique projection. Expected post lumpectomy and postradiation changes on the left. On physical exam, no mass is palpable in the medial right breast. Targeted ultrasound is performed, showing a 5 x 4 x 4 mm triangular shaped hypoechoic area with posterior acoustical shadowing in the 1 o'clock position of the right breast, 3 cm from the nipple. This was difficult to find and reproduced at real-time. This most likely corresponds to the spiculated mass-like density in the mid right breast slightly medially. Ultrasound of the right axilla demonstrated normal appearing right axillary lymph nodes. IMPRESSION: Small, spiculated mass-like density in the mid right breast slightly medially with a possible 5 mm ultrasound correlate in the 1 o'clock position, 3 cm from the nipple. On today's images, this is best seen and reproducible on the 3D craniocaudal mammogram images. RECOMMENDATION: 3D stereotactic guided core needle biopsy of the small, spiculated, mass-like density in the mid right breast slightly medially. This has been discussed with the patient and she will be assisted in getting the biopsy scheduled. I have discussed the findings and recommendations with the patient. If applicable, a reminder letter will be sent to the patient regarding the next appointment. BI-RADS CATEGORY  4: Suspicious. Electronically Signed   By: Claudie Revering M.D.   On: 09/26/2021 16:46  MM CLIP PLACEMENT RIGHT  Result Date: 10/04/2021 CLINICAL DATA:  Patient is post stereotactic core needle biopsy of a 5 mm spiculated mass over the middle third of the central right breast. EXAM: 3D DIAGNOSTIC RIGHT MAMMOGRAM POST STEREOTACTIC BIOPSY COMPARISON:  Previous exam(s). FINDINGS: 3D Mammographic images were obtained following stereotactic guided biopsy of the targeted 5 mm mass over the central right  breast.  Top of the breast was rolled slightly lateral prior to biopsy to avoid blood vessel. The biopsy marking clip is in expected position at the site of biopsy. IMPRESSION: Appropriate positioning of the X shaped biopsy marking clip at the site of biopsy in the central right breast. Final Assessment: Post Procedure Mammograms for Marker Placement Electronically Signed   By: Marin Olp M.D.   On: 10/04/2021 13:35  MM RT BREAST BX W LOC DEV 1ST LESION IMAGE BX SPEC STEREO GUIDE  Addendum Date: 10/05/2021   ADDENDUM REPORT: 10/05/2021 13:15 ADDENDUM: PATHOLOGY revealed: A. BREAST, RIGHT UPPER INNER QUADRANT; STEREOTACTIC CORE NEEDLE BIOPSY: - INVASIVE MAMMARY CARCINOMA, WITH LOBULAR FEATURES. Size of invasive carcinoma: 5 mm in this sample. Histologic grade of invasive carcinoma: Grade 2. Ductal carcinoma in situ: Present, intermediate grade. Lymphovascular invasion: Not identified. Pathology results are CONCORDANT with imaging findings, per Dr. Marin Olp. Pathology results and recommendations were discussed with patient via telephone on 10/05/2021. Patient reported biopsy site doing well with no adverse symptoms, and only slight tenderness at the site. Post biopsy care instructions were reviewed, questions were answered and my direct phone number was provided. Patient was instructed to call [facility] for any additional questions or concerns related to biopsy site. RECOMMENDATIONS: 1. Surgical consultation. Al Pimple RN and Tanya Nones RN at [facility] informed of patient's scheduled appointment with surgeon and provider (Dr. Herbert Pun) on 10/13/2021. 2. Consider bilateral breast MRI given lobular histology and to determine extent of breast disease. Pathology results reported by Electa Sniff RN on 10/05/2021. Electronically Signed   By: Marin Olp M.D.   On: 10/05/2021 13:15   Result Date: 10/05/2021 CLINICAL DATA:  Malignant left lumpectomy February  2022 for DCIS. Patient here today for stereotactic core needle biopsy of a 5 mm spiculated mass over the middle third of the central right breast. EXAM: RIGHT BREAST STEREOTACTIC CORE NEEDLE BIOPSY COMPARISON:  Previous exams. FINDINGS: The patient and I discussed the procedure of stereotactic-guided biopsy including benefits and alternatives. We discussed the high likelihood of a successful procedure. We discussed the risks of the procedure including infection, bleeding, tissue injury, clip migration, and inadequate sampling. Informed written consent was given. The usual time out protocol was performed immediately prior to the procedure. Using sterile technique and 1% Lidocaine as local anesthetic, under stereotactic guidance, a 9 gauge vacuum assisted device was used to perform core needle biopsy of the targeted spiculated mass over the central right breast using a superior to inferior approach. Lesion quadrant: Right upper inner quadrant (12-1 o'clock position). At the conclusion of the procedure, X shaped tissue marker clip was deployed into the biopsy cavity. Follow-up 2-view mammogram was performed and dictated separately. IMPRESSION: Stereotactic-guided biopsy of 5 mm spiculated mass over the central right breast. No apparent complications. Electronically Signed: By: Marin Olp M.D. On: 10/04/2021 13:25    ASSESSMENT & PLAN:   Carcinoma of overlapping sites of right breast in female, estrogen receptor positive ([place]) # status post biopsy RIGHT BREAST Early stage  ER/PR POSITIVE; Her2 NEGATIVE.  MRI January 2023-shows small cluster [subcentimeter x3] of concerning areas in the right breast-biopsy-proven malignancy.  Awaiting surgery.  #Proceed with surgery; possibly lumpectomy is planned.  I have messaged Dr. Peyton Najjar.  Discussed the patient will benefit from radiation postsurgery.  Clinically, less likely to need chemotherapy.  I again reviewed the importance of antiestrogen therapy post radiation.   Patient had severe intolerance to anastrozole [started on left DCIS].  I would recommend Aromasin.   #Osteopenia : Worse- bone density [June-- 2022- T-score of -2.0.; June 2020 [T score -1.3]-  On Vit D 1000/day. [not calcium-] for weight loss today; given upcoming surgery.  We will reschedule again in near future.  # DISPOSITION: # HOLD reclast # follow up post surgery- Dr.B   All questions were answered. The patient/family knows to call the clinic with any problems, questions or concerns.   Cammie Sickle, MD 10/18/2021 2:00 PM

## 2021-10-19 ENCOUNTER — Other Ambulatory Visit: Payer: Self-pay | Admitting: General Surgery

## 2021-10-19 DIAGNOSIS — R9389 Abnormal findings on diagnostic imaging of other specified body structures: Secondary | ICD-10-CM

## 2021-10-27 ENCOUNTER — Ambulatory Visit
Admission: RE | Admit: 2021-10-27 | Discharge: 2021-10-27 | Disposition: A | Discharge status: Home / Self Care | Payer: Medicare Other | Source: Ambulatory Visit | Attending: General Surgery | Admitting: General Surgery

## 2021-10-27 ENCOUNTER — Other Ambulatory Visit: Payer: Self-pay

## 2021-10-27 ENCOUNTER — Other Ambulatory Visit: Payer: Self-pay | Admitting: Diagnostic Radiology

## 2021-10-27 DIAGNOSIS — R9389 Abnormal findings on diagnostic imaging of other specified body structures: Secondary | ICD-10-CM

## 2021-10-27 MED ORDER — GADOBUTROL 1 MMOL/ML IV SOLN
10.0000 mL | Freq: Once | INTRAVENOUS | Status: AC | PRN
  Administered 2021-10-27: 10 mL via INTRAVENOUS

## 2021-11-02 ENCOUNTER — Ambulatory Visit: Payer: Self-pay | Admitting: General Surgery

## 2021-11-02 ENCOUNTER — Encounter: Payer: Self-pay | Admitting: *Deleted

## 2021-11-02 NOTE — Progress Notes (Addendum)
Patient had left me a voicemail to return her call.  I called patient back.  She is upset that Dr. Peyton Najjar does not have her pathology.  States the nurse from [facility] called her on Friday and said that it was malignant.  I informed patient that Dr. Peyton Najjar and I spoke yesterday afternoon and that he nor I can find you pathology in our medical record system.  Informed patient I would call the nurses in [place] and follow up with her.  I spoke to Electa Sniff, RN and Stacie Acres, RN at [facility] today.  [facility] Schwartz did call the patient on Friday to inform her that the biopsy was positive for cancer.  [facility]a Schwartz stated that they are having IT difficulties and cannot get the pathology to come across so there is no printable path at this time.  States [employer], Pathology and [facility] are all working on the problem.  I have called pathology to see if I could a faxed report.  I have left the patient a message to return my call to review with her.  Spoke with patient and reviewed the above information.

## 2021-11-03 ENCOUNTER — Other Ambulatory Visit: Payer: Self-pay | Admitting: General Surgery

## 2021-11-03 DIAGNOSIS — C50211 Malignant neoplasm of upper-inner quadrant of right female breast: Secondary | ICD-10-CM

## 2021-11-03 DIAGNOSIS — Z170 Estrogen receptor positive status [ER+]: Secondary | ICD-10-CM

## 2021-11-04 ENCOUNTER — Encounter
Admission: RE | Admit: 2021-11-04 | Discharge: 2021-11-04 | Disposition: A | Discharge status: Home / Self Care | Payer: Medicare Other | Source: Ambulatory Visit | Attending: General Surgery | Admitting: General Surgery

## 2021-11-04 ENCOUNTER — Other Ambulatory Visit: Payer: Self-pay

## 2021-11-04 HISTORY — DX: Dyspnea, unspecified: R06.00

## 2021-11-04 HISTORY — DX: COVID-19: U07.1

## 2021-11-04 NOTE — Patient Instructions (Signed)
Your procedure is scheduled on: 11/07/21 Report to [place]. To find out your arrival time please call [phone] between 1PM - 3PM on 11/04/21.  Remember: Instructions that are not followed completely may result in serious medical risk, up to and including death, or upon the discretion of your surgeon and anesthesiologist your surgery may need to be rescheduled.     _X__ 1. Do not eat food or drink any liquids after midnight the night before your procedure.                 No gum chewing or hard candies.   __X__2.  On the morning of surgery brush your teeth with toothpaste and water, you                 may rinse your mouth with mouthwash if you wish.  Do not swallow any              toothpaste of mouthwash.     _X__ 3.  No Alcohol for 24 hours before or after surgery.   _X__ 4.  Do Not Smoke or use e-cigarettes For 24 Hours Prior to Your Surgery.                 Do not use any chewable tobacco products for at least 6 hours prior to                 surgery.  ____  5.  Bring all medications with you on the day of surgery if instructed.   __X__  6.  Notify your doctor if there is any change in your medical condition      (cold, fever, infections).     Do not wear jewelry, make-up, hairpins, clips or nail polish. Do not wear lotions, powders, or perfumes.  Do not shave body hair 48 hours prior to surgery. Men may shave face and neck. Do not bring valuables to the hospital.    [facility] is not responsible for any belongings or valuables.  Contacts, dentures/partials or body piercings may not be worn into surgery. Bring a case for your contacts, glasses or hearing aids, a denture cup will be supplied. Leave your suitcase in the car. After surgery it may be brought to your room. For patients admitted to the hospital, discharge time is determined by your treatment team.   Patients discharged the day of surgery will not be  allowed to drive home.   Please read over the following fact sheets that you were given:     __X__ Take these medicines the morning of surgery with A SIP OF WATER:    1. metoprolol tartrate (LOPRESSOR) 25 MG tablet  2.   3.   4.  5.  6.  ____ Fleet Enema (as directed)   __X__ Use CHG Soap/SAGE wipes as directed  ____ Use inhalers on the day of surgery  ____ Stop metformin/Janumet/Farxiga 2 days prior to surgery    ____ Take 1/2 of usual insulin dose the night before surgery. No insulin the morning          of surgery.   ____ Stop Blood Thinners Coumadin/Plavix/Xarelto/Pleta/Pradaxa/Eliquis/Effient/Aspirin  on   Or contact your Surgeon, Cardiologist or Medical Doctor regarding  ability to stop your blood thinners  __X__ Stop Anti-inflammatories 7 days before surgery such as Advil, Ibuprofen, Motrin,  BC or Goodies Powder, Naprosyn, Naproxen, Aleve, Aspirin    __X__ Stop all herbals  and supplements, fish oil or vitamins  until after surgery.    ____ Bring C-Pap to the hospital.    HOLD ASPIRIN, CO Q 10 AND NAPROSYN UNTIL AFTER SURGERY

## 2021-11-07 ENCOUNTER — Ambulatory Visit: Payer: Medicare Other | Admitting: Urgent Care

## 2021-11-07 ENCOUNTER — Encounter
Admission: RE | Disposition: A | Discharge status: Home / Self Care | Payer: Self-pay | Source: Home / Self Care | Attending: General Surgery

## 2021-11-07 ENCOUNTER — Observation Stay
Admission: RE | Admit: 2021-11-07 | Discharge: 2021-11-08 | Disposition: A | Discharge status: Home / Self Care | Payer: Medicare Other | Attending: General Surgery | Admitting: General Surgery

## 2021-11-07 ENCOUNTER — Other Ambulatory Visit: Payer: Self-pay

## 2021-11-07 ENCOUNTER — Ambulatory Visit
Admission: RE | Admit: 2021-11-07 | Discharge: 2021-11-07 | Disposition: A | Discharge status: Home / Self Care | Payer: Medicare Other | Source: Ambulatory Visit | Attending: General Surgery | Admitting: General Surgery

## 2021-11-07 ENCOUNTER — Encounter
Admission: RE | Admit: 2021-11-07 | Discharge: 2021-11-07 | Disposition: A | Discharge status: Home / Self Care | Payer: Medicare Other | Source: Ambulatory Visit | Attending: General Surgery | Admitting: General Surgery

## 2021-11-07 ENCOUNTER — Encounter: Payer: Self-pay | Admitting: General Surgery

## 2021-11-07 DIAGNOSIS — Z79899 Other long term (current) drug therapy: Secondary | ICD-10-CM | POA: Insufficient documentation

## 2021-11-07 DIAGNOSIS — Z170 Estrogen receptor positive status [ER+]: Secondary | ICD-10-CM

## 2021-11-07 DIAGNOSIS — I10 Essential (primary) hypertension: Secondary | ICD-10-CM | POA: Insufficient documentation

## 2021-11-07 DIAGNOSIS — C50211 Malignant neoplasm of upper-inner quadrant of right female breast: Secondary | ICD-10-CM

## 2021-11-07 DIAGNOSIS — C50919 Malignant neoplasm of unspecified site of unspecified female breast: Secondary | ICD-10-CM | POA: Diagnosis present

## 2021-11-07 DIAGNOSIS — Z7982 Long term (current) use of aspirin: Secondary | ICD-10-CM | POA: Insufficient documentation

## 2021-11-07 HISTORY — PX: TOTAL MASTECTOMY: SHX6129

## 2021-11-07 SURGERY — MASTECTOMY, SIMPLE
Anesthesia: General | Site: Breast | Laterality: Right

## 2021-11-07 MED ORDER — ONDANSETRON HCL 4 MG/2ML IJ SOLN
INTRAMUSCULAR | Status: DC | PRN
  Administered 2021-11-07: 4 mg via INTRAVENOUS

## 2021-11-07 MED ORDER — ACETAMINOPHEN 10 MG/ML IV SOLN
INTRAVENOUS | Status: AC
  Filled 2021-11-07: qty 100

## 2021-11-07 MED ORDER — MIDAZOLAM HCL 2 MG/2ML IJ SOLN
INTRAMUSCULAR | Status: AC
  Filled 2021-11-07: qty 2

## 2021-11-07 MED ORDER — FAMOTIDINE 20 MG PO TABS: 20.0000 mg | ORAL_TABLET | Freq: Once | ORAL | Status: AC

## 2021-11-07 MED ORDER — BUPIVACAINE-EPINEPHRINE (PF) 0.5% -1:200000 IJ SOLN
INTRAMUSCULAR | Status: DC | PRN
  Administered 2021-11-07: 50 mL via SURGICAL_CAVITY

## 2021-11-07 MED ORDER — DEXAMETHASONE SODIUM PHOSPHATE 10 MG/ML IJ SOLN
INTRAMUSCULAR | Status: DC | PRN
  Administered 2021-11-07: 5 mg via INTRAVENOUS

## 2021-11-07 MED ORDER — CHLORHEXIDINE GLUCONATE 0.12 % MT SOLN: 15.0000 mL | Freq: Once | OROMUCOSAL | Status: AC

## 2021-11-07 MED ORDER — PROPOFOL 10 MG/ML IV BOLUS
INTRAVENOUS | Status: AC
  Filled 2021-11-07: qty 20

## 2021-11-07 MED ORDER — PHENYLEPHRINE HCL (PRESSORS) 10 MG/ML IV SOLN
INTRAVENOUS | Status: DC | PRN
  Administered 2021-11-07 (×3): 80 ug via INTRAVENOUS

## 2021-11-07 MED ORDER — EPHEDRINE SULFATE (PRESSORS) 50 MG/ML IJ SOLN
INTRAMUSCULAR | Status: DC | PRN
  Administered 2021-11-07: 5 mg via INTRAVENOUS

## 2021-11-07 MED ORDER — ORAL CARE MOUTH RINSE: 15.0000 mL | Freq: Once | OROMUCOSAL | Status: AC

## 2021-11-07 MED ORDER — LACTATED RINGERS IV SOLN: INTRAVENOUS | Status: DC

## 2021-11-07 MED ORDER — BUPIVACAINE LIPOSOME 1.3 % IJ SUSP
INTRAMUSCULAR | Status: AC
  Filled 2021-11-07: qty 20

## 2021-11-07 MED ORDER — FENTANYL CITRATE (PF) 100 MCG/2ML IJ SOLN
INTRAMUSCULAR | Status: DC | PRN
  Administered 2021-11-07 (×4): 25 ug via INTRAVENOUS
  Administered 2021-11-07 (×2): 50 ug via INTRAVENOUS

## 2021-11-07 MED ORDER — PANTOPRAZOLE SODIUM 40 MG IV SOLR
40.0000 mg | Freq: Every day | INTRAVENOUS | Status: DC
  Administered 2021-11-07: 40 mg via INTRAVENOUS
  Filled 2021-11-07: qty 40

## 2021-11-07 MED ORDER — DEXAMETHASONE SODIUM PHOSPHATE 10 MG/ML IJ SOLN
INTRAMUSCULAR | Status: AC
  Filled 2021-11-07: qty 1

## 2021-11-07 MED ORDER — HYDROCODONE-ACETAMINOPHEN 5-325 MG PO TABS
1.0000 | ORAL_TABLET | ORAL | Status: DC | PRN
  Administered 2021-11-08: 1 via ORAL
  Filled 2021-11-07: qty 1

## 2021-11-07 MED ORDER — COENZYME Q10 100 MG PO CAPS: 100.0000 mg | ORAL_CAPSULE | Freq: Every day | ORAL | Status: DC

## 2021-11-07 MED ORDER — SODIUM CHLORIDE 0.9 % IV SOLN: INTRAVENOUS | Status: DC

## 2021-11-07 MED ORDER — MORPHINE SULFATE (PF) 4 MG/ML IV SOLN
4.0000 mg | INTRAVENOUS | Status: DC | PRN
  Administered 2021-11-07: 4 mg via INTRAVENOUS
  Filled 2021-11-07: qty 1

## 2021-11-07 MED ORDER — ONDANSETRON HCL 4 MG/2ML IJ SOLN
4.0000 mg | Freq: Four times a day (QID) | INTRAMUSCULAR | Status: DC | PRN
  Administered 2021-11-07: 4 mg via INTRAVENOUS
  Filled 2021-11-07: qty 2

## 2021-11-07 MED ORDER — DEXMEDETOMIDINE (PRECEDEX) IN NS 20 MCG/5ML (4 MCG/ML) IV SYRINGE
PREFILLED_SYRINGE | INTRAVENOUS | Status: DC | PRN
  Administered 2021-11-07: 20 ug via INTRAVENOUS

## 2021-11-07 MED ORDER — METHYLENE BLUE 0.5 % INJ SOLN
INTRAVENOUS | Status: AC
  Filled 2021-11-07: qty 10

## 2021-11-07 MED ORDER — GABAPENTIN 100 MG PO CAPS
300.0000 mg | ORAL_CAPSULE | Freq: Two times a day (BID) | ORAL | Status: DC
  Administered 2021-11-07 – 2021-11-08 (×2): 300 mg via ORAL
  Filled 2021-11-07: qty 3
  Filled 2021-11-07: qty 1

## 2021-11-07 MED ORDER — ONDANSETRON HCL 4 MG/2ML IJ SOLN: 4.0000 mg | Freq: Once | INTRAMUSCULAR | Status: DC | PRN

## 2021-11-07 MED ORDER — LIDOCAINE HCL (CARDIAC) PF 100 MG/5ML IV SOSY
PREFILLED_SYRINGE | INTRAVENOUS | Status: DC | PRN
  Administered 2021-11-07: 80 mg via INTRAVENOUS

## 2021-11-07 MED ORDER — LIDOCAINE HCL (PF) 2 % IJ SOLN
INTRAMUSCULAR | Status: AC
  Filled 2021-11-07: qty 5

## 2021-11-07 MED ORDER — METOPROLOL TARTRATE 25 MG PO TABS
25.0000 mg | ORAL_TABLET | Freq: Two times a day (BID) | ORAL | Status: DC
  Administered 2021-11-07 – 2021-11-08 (×2): 25 mg via ORAL
  Filled 2021-11-07 (×2): qty 1

## 2021-11-07 MED ORDER — BUPIVACAINE-EPINEPHRINE (PF) 0.5% -1:200000 IJ SOLN
INTRAMUSCULAR | Status: AC
  Filled 2021-11-07: qty 30

## 2021-11-07 MED ORDER — FENTANYL CITRATE (PF) 100 MCG/2ML IJ SOLN
25.0000 ug | INTRAMUSCULAR | Status: DC | PRN
  Administered 2021-11-07 (×2): 25 ug via INTRAVENOUS

## 2021-11-07 MED ORDER — CELECOXIB 200 MG PO CAPS
200.0000 mg | ORAL_CAPSULE | Freq: Two times a day (BID) | ORAL | Status: DC
  Administered 2021-11-07 – 2021-11-08 (×2): 200 mg via ORAL
  Filled 2021-11-07 (×3): qty 1

## 2021-11-07 MED ORDER — CEFAZOLIN SODIUM-DEXTROSE 2-4 GM/100ML-% IV SOLN
INTRAVENOUS | Status: AC
  Filled 2021-11-07: qty 100

## 2021-11-07 MED ORDER — ACETAMINOPHEN 10 MG/ML IV SOLN
INTRAVENOUS | Status: DC | PRN
  Administered 2021-11-07: 1000 mg via INTRAVENOUS

## 2021-11-07 MED ORDER — SEVOFLURANE IN SOLN
RESPIRATORY_TRACT | Status: AC
  Filled 2021-11-07: qty 250

## 2021-11-07 MED ORDER — ONDANSETRON 4 MG PO TBDP
4.0000 mg | ORAL_TABLET | Freq: Four times a day (QID) | ORAL | Status: DC | PRN
Start: 2021-11-07 — End: 2021-11-08

## 2021-11-07 MED ORDER — LISINOPRIL 20 MG PO TABS
20.0000 mg | ORAL_TABLET | Freq: Every day | ORAL | Status: DC
  Administered 2021-11-08: 20 mg via ORAL
  Filled 2021-11-07: qty 1

## 2021-11-07 MED ORDER — FENTANYL CITRATE (PF) 100 MCG/2ML IJ SOLN
INTRAMUSCULAR | Status: AC
  Filled 2021-11-07: qty 2

## 2021-11-07 MED ORDER — CEFAZOLIN SODIUM-DEXTROSE 2-4 GM/100ML-% IV SOLN
2.0000 g | INTRAVENOUS | Status: AC
  Administered 2021-11-07: 2 g via INTRAVENOUS

## 2021-11-07 MED ORDER — SODIUM CHLORIDE (PF) 0.9 % IJ SOLN
INTRAMUSCULAR | Status: AC
  Filled 2021-11-07: qty 50

## 2021-11-07 MED ORDER — PROPOFOL 10 MG/ML IV BOLUS
INTRAVENOUS | Status: DC | PRN
  Administered 2021-11-07: 200 mg via INTRAVENOUS

## 2021-11-07 MED ORDER — ENOXAPARIN SODIUM 40 MG/0.4ML IJ SOSY
40.0000 mg | PREFILLED_SYRINGE | INTRAMUSCULAR | Status: DC
  Administered 2021-11-08: 40 mg via SUBCUTANEOUS
  Filled 2021-11-07: qty 0.4

## 2021-11-07 MED ORDER — FAMOTIDINE 20 MG PO TABS
ORAL_TABLET | ORAL | Status: AC
  Administered 2021-11-07: 20 mg via ORAL
  Filled 2021-11-07: qty 1

## 2021-11-07 MED ORDER — HEMOSTATIC AGENTS (NO CHARGE) OPTIME
TOPICAL | Status: DC | PRN
  Administered 2021-11-07: 1 via TOPICAL

## 2021-11-07 MED ORDER — ONDANSETRON HCL 4 MG/2ML IJ SOLN
INTRAMUSCULAR | Status: AC
  Filled 2021-11-07: qty 2

## 2021-11-07 MED ORDER — ENOXAPARIN SODIUM 40 MG/0.4ML IJ SOSY: 40.0000 mg | PREFILLED_SYRINGE | INTRAMUSCULAR | Status: DC

## 2021-11-07 MED ORDER — MIDAZOLAM HCL 2 MG/2ML IJ SOLN
INTRAMUSCULAR | Status: DC | PRN
Start: 2021-11-07 — End: 2021-11-07
  Administered 2021-11-07 (×2): 1 mg via INTRAVENOUS

## 2021-11-07 MED ORDER — CHLORHEXIDINE GLUCONATE 0.12 % MT SOLN
OROMUCOSAL | Status: AC
  Administered 2021-11-07: 15 mL via OROMUCOSAL
  Filled 2021-11-07: qty 15

## 2021-11-07 MED ORDER — TECHNETIUM TC 99M TILMANOCEPT KIT
1.0500 | PACK | Freq: Once | INTRAVENOUS | Status: AC | PRN
  Administered 2021-11-07: 1.05 via INTRADERMAL

## 2021-11-07 MED ORDER — DEXMEDETOMIDINE HCL IN NACL 200 MCG/50ML IV SOLN
INTRAVENOUS | Status: AC
  Filled 2021-11-07: qty 50

## 2021-11-07 SURGICAL SUPPLY — 52 items
APPLIER CLIP 11 MED OPEN (CLIP)
APPLIER CLIP 9.375 SM OPEN (CLIP)
BLADE SURG 15 STRL LF DISP TIS (BLADE) ×1 IMPLANT
BLADE SURG 15 STRL SS (BLADE) ×1
BULB RESERV EVAC DRAIN JP 100C (MISCELLANEOUS) ×2 IMPLANT
CHLORAPREP W/TINT 26 (MISCELLANEOUS) ×1 IMPLANT
CLIP APPLIE 11 MED OPEN (CLIP) IMPLANT
CLIP APPLIE 9.375 SM OPEN (CLIP) IMPLANT
CNTNR SPEC 2.5X3XGRAD LEK (MISCELLANEOUS)
CONT SPEC 4OZ STER OR WHT (MISCELLANEOUS)
CONTAINER SPEC 2.5X3XGRAD LEK (MISCELLANEOUS) ×1 IMPLANT
DERMABOND ADVANCED (GAUZE/BANDAGES/DRESSINGS) ×2
DERMABOND ADVANCED .7 DNX12 (GAUZE/BANDAGES/DRESSINGS) ×2 IMPLANT
DEVICE DUBIN SPECIMEN MAMMOGRA (MISCELLANEOUS) ×1 IMPLANT
DRAIN CHANNEL JP 19F (MISCELLANEOUS) ×1 IMPLANT
DRAPE LAPAROTOMY TRNSV 106X77 (MISCELLANEOUS) ×2 IMPLANT
DRSG GAUZE FLUFF 36X18 (GAUZE/BANDAGES/DRESSINGS) ×1 IMPLANT
ELECT BLADE 6.5 EXT (BLADE) ×1 IMPLANT
ELECT CAUTERY BLADE 6.4 (BLADE) ×1 IMPLANT
ELECT REM PT RETURN 9FT ADLT (ELECTROSURGICAL) ×2
ELECTRODE REM PT RTRN 9FT ADLT (ELECTROSURGICAL) ×1 IMPLANT
GAUZE 4X4 16PLY ~~LOC~~+RFID DBL (SPONGE) ×2 IMPLANT
GAUZE SPONGE 4X4 12PLY STRL (GAUZE/BANDAGES/DRESSINGS) ×1 IMPLANT
GLOVE SURG ENC MOIS LTX SZ6.5 (GLOVE) ×5 IMPLANT
GLOVE SURG UNDER POLY LF SZ6.5 (GLOVE) ×4 IMPLANT
GOWN STRL REUS W/ TWL LRG LVL3 (GOWN DISPOSABLE) ×2 IMPLANT
GOWN STRL REUS W/TWL LRG LVL3 (GOWN DISPOSABLE) ×3
HEMOSTAT ARISTA ABSORB 1G (HEMOSTASIS) ×1 IMPLANT
JACKSON PRATT 10 (INSTRUMENTS) ×1 IMPLANT
KIT TURNOVER KIT A (KITS) ×2 IMPLANT
LABEL OR SOLS (LABEL) ×2 IMPLANT
MANIFOLD NEPTUNE II (INSTRUMENTS) ×2 IMPLANT
NEEDLE HYPO 22GX1.5 SAFETY (NEEDLE) ×2 IMPLANT
PACK BASIN MAJOR ARMC (MISCELLANEOUS) ×2 IMPLANT
SLEVE PROBE SENORX GAMMA FIND (MISCELLANEOUS) ×2 IMPLANT
SPONGE T-LAP 18X18 ~~LOC~~+RFID (SPONGE) ×5 IMPLANT
SUT ETHILON 3-0 FS-10 30 BLK (SUTURE) ×4
SUT MNCRL 4-0 (SUTURE) ×3
SUT MNCRL 4-0 27XMFL (SUTURE) ×3
SUT SILK 2 0 SH (SUTURE) ×2 IMPLANT
SUT SILK 3-0 (SUTURE) ×2 IMPLANT
SUT VIC AB 3-0 54X BRD REEL (SUTURE) ×1 IMPLANT
SUT VIC AB 3-0 BRD 54 (SUTURE) ×1
SUT VIC AB 3-0 SH 27 (SUTURE)
SUT VIC AB 3-0 SH 27X BRD (SUTURE) IMPLANT
SUT VIC AB 3-0 SH 8-18 (SUTURE) ×2 IMPLANT
SUTURE EHLN 3-0 FS-10 30 BLK (SUTURE) ×1 IMPLANT
SUTURE MNCRL 4-0 27XMF (SUTURE) ×2 IMPLANT
SYR 20ML LL LF (SYRINGE) ×1 IMPLANT
SYR BULB IRRIG 60ML STRL (SYRINGE) ×2 IMPLANT
WATER STERILE IRR 1000ML POUR (IV SOLUTION) ×2 IMPLANT
WATER STERILE IRR 500ML POUR (IV SOLUTION) ×1 IMPLANT

## 2021-11-07 NOTE — Anesthesia Procedure Notes (Signed)
Procedure Name: LMA Insertion Date/Time: 11/07/2021 1:56 PM Performed by: Hedda Slade, CRNA Pre-anesthesia Checklist: Patient identified, Patient being monitored, Timeout performed, Emergency Drugs available and Suction available Patient Re-evaluated:Patient Re-evaluated prior to induction Oxygen Delivery Method: Circle system utilized Preoxygenation: Pre-oxygenation with 100% oxygen Induction Type: IV induction Ventilation: Mask ventilation without difficulty LMA: LMA inserted Tube type: Oral Number of attempts: 1 Placement Confirmation: positive ETCO2 and breath sounds checked- equal and bilateral Tube secured with: Tape Dental Injury: Teeth and Oropharynx as per pre-operative assessment

## 2021-11-07 NOTE — Anesthesia Preprocedure Evaluation (Signed)
Anesthesia Evaluation  Patient identified by MRN, date of birth, ID band Patient awake    Reviewed: Allergy & Precautions, NPO status , Patient's Chart, lab work & pertinent test results  Airway Mallampati: II  TM Distance: >3 FB Neck ROM: full    Dental  (+) Teeth Intact   Pulmonary neg pulmonary ROS, shortness of breath,    Pulmonary exam normal        Cardiovascular Exercise Tolerance: Good hypertension, Pt. 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     Comment:  Left 2000: Breast cancer ([place])     Comment:  left lumpectomy No date: Colon polyps No date: COVID     Comment:  09/2021 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 No date: Dyspnea No date: Dysrhythmia     Comment:  pvc No date: Hyperlipidemia No date: Hypertension No date: IBS (irritable bowel syndrome) No date: Intermittent constipation No date: Intermittent diarrhea No date: Left sided lacunar infarction [facility])     Comment:  per pt 11/04/21 testing at [facility] thought to be inner ear               problem No date: OA (osteoarthritis) No date: Obesity No date: Personal history of radiation therapy 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  Past Surgical History: 2000: BREAST BIOPSY; Left     Comment:  Positive 11/03/2020: BREAST BIOPSY; Left     Comment:  affirm bx ,  posterior group coil marker, DCIS NUCLEAR               GRADE 2 WITH COMEDONECROSIS AND ASSOCIATED CALCIFICATIONS               ATYPICAL LOBULAR HYPERPLASIA 11/03/2020: BREAST BIOPSY; Left     Comment:  affirm bx, anterior group x marker, DCIS NUCLEAR GRADE 2              WITH COMEDONECROSIS AND ASSOCIATED CALCIFICATIONS                ATYPICAL LOBULAR HYPERPLASIA 10/04/2021: BREAST BIOPSY; Right     Comment:  Stereo Bx, X-clip, path pending 2000: BREAST LUMPECTOMY; Left     Comment:  positive for breast ca 11/19/2020: BREAST LUMPECTOMY; Left     Comment:  NL with SN DCIS No date: CESAREAN SECTION No date: CHOLECYSTECTOMY No date: COLONOSCOPY 07/04/2016: COLONOSCOPY WITH PROPOFOL; N/A     Comment:  Procedure: COLONOSCOPY WITH PROPOFOL;  Surgeon: Lollie Sails, MD;  Location: [facility] ENDOSCOPY;  Service:               Endoscopy;  Laterality: N/A; 07/21/2021: COLONOSCOPY WITH PROPOFOL; N/A     Comment:  Procedure: COLONOSCOPY WITH PROPOFOL;  Surgeon: Annamaria Helling, DO;  Location: [facility] ENDOSCOPY;  Service:               Gastroenterology;  Laterality: N/A; No date: Left Wrist Biopsy  Comment:  pt denies 11/04/21 11/19/2020: PARTIAL MASTECTOMY WITH NEEDLE LOCALIZATION AND AXILLARY  SENTINEL LYMPH NODE BX; Left     Comment:  Procedure: PARTIAL MASTECTOMY WITH NEEDLE LOCALIZATION               AND AXILLARY SENTINEL LYMPH NODE BX;  Surgeon:               Herbert Pun, MD;  Location: [facility] ORS;  Service:              General;  Laterality: Left;  BMI    Body Mass Index: 42.40 kg/m      Reproductive/Obstetrics negative OB ROS                             Anesthesia Physical Anesthesia Plan  ASA: 3  Anesthesia Plan: General   Post-op Pain Management:    Induction: Intravenous  PONV Risk Score and Plan: 2 and Ondansetron and Dexamethasone  Airway Management Planned: Oral ETT  Additional Equipment:   Intra-op Plan:   Post-operative Plan: Extubation in OR  Informed Consent: I have reviewed the patients History and Physical,  chart, labs and discussed the procedure including the risks, benefits and alternatives for the proposed anesthesia with the patient or authorized representative who has indicated his/her understanding and acceptance.     Dental Advisory Given  Plan Discussed with: CRNA and Surgeon  Anesthesia Plan Comments:         Anesthesia Quick Evaluation

## 2021-11-07 NOTE — Op Note (Signed)
Preoperative diagnosis: Invasive mammary carcinoma of the right breast.  Postoperative diagnosis: Same.   Procedure: Right total mastectomy.                     Right axillary Sentinel Lymph node biopsy  Anesthesia: GETA  Surgeon: Dr. Windell Moment  Wound Classification: Clean  Indications: Patient is a 76 y.o. female who had an abnormal mammogram that on workup with core needle biopsy was found to be invasive lobular carcinoma with multiple masses identified on MRI. After discussion of alternatives, the patient elected total mastectomy and sentinel node biopsy.  Findings: 1.  No gross pathology identified invading adjacent tissue. 2.  2 sentinel nodes identified  Description of procedure: The patient was brought to the operating room and general anesthesia was induced. A time-out was completed verifying correct patient, procedure, site, positioning, and implant(s) and/or special equipment prior to beginning this procedure. The breast, chest wall, axilla, and upper arm and neck were prepped and draped in the usual sterile fashion.  A skin incision was made that encompassed the nipple-areola complex and the previous biopsy scar and passed in an oblique direction across the breast. Flaps were raised in the avascular plane between subcutaneous tissue and breast tissue from the clavicle superiorly, the sternum medially, the anterior rectus sheath inferiorly, and past the lateral border of the pectoralis major muscle laterally. Hemostasis was achieved in the flaps. Next, the breast tissue and underlying pectoralis fascia were excised from the pectoralis major muscle, progressing from medially to laterally. At the lateral border of the pectoralis major muscle, the breast tissue was swung laterally and a lateral pedicle identified where breast tissue gave way to fat of axilla. The lateral pedicle was incised and the specimen removed.   A hand-held gamma probe was used to identify the location of the  hottest spot in the axilla. An incision was made around the caudal axillary hairline. Dissection was carried down until subdermal facias was advanced. The probe was placed and again, the point of maximal count was found. Dissection continue until nodule was identified. The probe was placed in contact with the node. The node was excised in its entirety.  An additional hot spot was detected and the node was excised in similar fashion. No additional hot spots were identified. No clinically abnormal nodes were palpated. The procedure was terminated. Hemostasis was achieved.   The wound was irrigated and hemostasis was achieved. Closed suction drains were brought into the operative field through a separate stab incision and sutured to the skin with a 3-0 nylon suture. The wound was closed with interrupted 3-0 Vicryl to the subcutaneous layer, followed by a subcuticular layer of Monocryl 4-0. The wound was dressed.  The patient tolerated the procedure well and was taken to the postanesthesia care unit in stable condition.   Sentinel Node Biopsy Synoptic Operative Report  Operation performed with curative intent:Yes  Tracer(s) used to identify sentinel nodes in the upfront surgery (non-neoadjuvant) setting (select all that apply):Radioactive Tracer  Tracer(s) used to identify sentinel nodes in the neoadjuvant setting (select all that apply):N/A  All nodes (colored or non-colored) present at the end of a dye-filled lymphatic channel were removed:N/A  All significantly radioactive nodes were removed:Yes  All palpable suspicious nodes were removed:N/A  Biopsy-proven positive nodes marked with clips prior to chemotherapy were identified and removed:N/A  Specimen: Right Breast                    Sentinel  lymph nodes #1, #2.   Complications: None  Estimated Blood Loss: 50 mL

## 2021-11-07 NOTE — Transfer of Care (Signed)
Immediate Anesthesia Transfer of Care Note  Patient: Kelly Howell  Procedure(s) Performed: TOTAL MASTECTOMY WITH SENTINEL LYMPH NODE BIOPSY (Right: Breast)  Patient Location: PACU  Anesthesia Type:General  Level of Consciousness: awake, alert  and oriented  Airway & Oxygen Therapy: Patient Spontanous Breathing and Patient connected to face mask oxygen  Post-op Assessment: Report given to RN and Post -op Vital signs reviewed and stable  Post vital signs: Reviewed and stable  Last Vitals:  Vitals Value Taken Time  BP 166/74 11/07/21 1730  Temp    Pulse 93 11/07/21 1734  Resp 15 11/07/21 1734  SpO2 97 % 11/07/21 1734  Vitals shown include unvalidated device data.  Last Pain: There were no vitals filed for this visit.       Complications: No notable events documented.

## 2021-11-07 NOTE — H&P (Signed)
HISTORY OF PRESENT ILLNESS:   Ms. Kelly Howell is a 76 y.o.female patient who comes for evaluation of right breast cancer.  Patient has a history of left breast DCIS. She underwent partial mastectomy and sentinel node biopsy. She completed radiation therapy. She was unable to complete antiestrogen therapy due to side effects. She was having significant joint pain and swelling. Patient had her surveillance mammogram. He was found with right breast spiculated mass. Core biopsy shows invasive mammary carcinoma with lobular features. Bilateral breast MRI shows two other posterior masses that are also invasive mammary carcinoma.   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   Colon polyps   Diverticulosis 07/04/2016   Dumping syndrome   Endometrial thickening on ultra sound  with negative endometrial biopsy 07/2004   Hyperlipidemia   Hypertension   IBS (irritable bowel syndrome)   Intermittent constipation   Intermittent diarrhea   Left sided lacunar infarction (CMS-HCC)  versus vertigo, according to patient   OA (osteoarthritis)   Obesity   Torus palatinus     PAST SURGICAL HISTORY:  Past Surgical History:  Procedure Laterality Date   Wisdom teeth extraction [place]   Left wrist biopsy with dysplasia 2000   Excisional biopsy and lumpectomy of the left breast 07/2000  Evidence of dysplasia; patient treated with tamoxifen for 5 years   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   atorvastatin (LIPITOR) 20 MG tablet TAKE 1 TABLET BY MOUTH EVERY DAY 90 tablet 3   cholecalciferol (VITAMIN D3) 1000 unit tablet Take by mouth once daily   co-enzyme Q-10, ubiquinone, 100 mg capsule Take 100 mg by mouth once daily.   cyanocobalamin (VITAMIN B12) 1,000 mcg/mL injection INJECT 1 ML (1,000 MCG TOTAL) INTO THE MUSCLE MONTHLY 3 mL 3   fexofenadine (ALLEGRA) 180 MG tablet Take 180 mg by mouth once daily as needed.   fluticasone (FLONASE) 50 mcg/actuation nasal spray USE 2 SPRAYS INTO EACH NOSTRIL EVERY DAY 16 g 5   hyaluronate (SYNVISC) 16 mg/2 mL injection Inject into the articular space once a week.   lisinopriL (ZESTRIL) 20 MG tablet TAKE 1 TABLET BY MOUTH EVERY DAY 90 tablet 3   metoprolol tartrate (LOPRESSOR) 25 MG tablet TAKE 1 TABLET BY MOUTH TWICE A DAY 180 tablet 3   naproxen (NAPROSYN) 500 MG tablet TAKE 1 TABLET BY MOUTH TWICE A DAY WITH MEALS 180 tablet 1   nystatin (MYCOSTATIN) 100,000 unit/gram cream Apply topically 2 (two) times daily 30 g 1   triamcinolone 0.1 % ointment APPLY TO INFLAMED AREAS BID PRN 1   VANISHPOINT SYRINGE 3 mL 25 gauge x 1" Syrg USE 1 EACH MONTHLY 3 each 3   No facility-administered encounter medications on file as of 10/13/2021.    ALLERGIES:  Adhesive tape-silicones, Gantrisin [  sulfisoxazole acetyl], Sulfa (sulfonamide antibiotics), and Sulfisoxazole  SOCIAL HISTORY:  Social History   Socioeconomic History   Marital status: Married  Tobacco Use   Smoking status: Never   Smokeless tobacco: Never  Vaping Use   Vaping Use: Never used  Substance and Sexual Activity   Alcohol use: No  Alcohol/week: 0.0 standard drinks   Drug use: No   Sexual activity: Yes  Partners: Male   FAMILY HISTORY:  Family History  Problem Relation Age of Onset    Myocardial Infarction (Heart attack) Father   Stroke Father 73   High blood pressure (Hypertension) Mother   Colon cancer Mother 41   High blood pressure (Hypertension) Other  Grandparent   Thyroid disease Other  Grandparent    GENERAL REVIEW OF SYSTEMS:   General ROS: negative for - chills, fatigue, fever, weight gain or weight loss Allergy and Immunology ROS: negative for - hives  Hematological and Lymphatic ROS: negative for - bleeding problems or bruising, negative for palpable nodes Endocrine ROS: negative for - heat or cold intolerance, hair changes Respiratory ROS: negative for - cough, shortness of breath or wheezing Cardiovascular ROS: no chest pain or palpitations GI ROS: negative for nausea, vomiting, abdominal pain, diarrhea, constipation Musculoskeletal ROS: negative for - joint swelling or muscle pain Neurological ROS: negative for - confusion, syncope Dermatological ROS: negative for pruritus and rash  PHYSICAL EXAM:  Vitals:  10/13/21 1034  BP: 121/60  Pulse: 70  .  Ht:162.6 cm (5\' 4" ) Wt:(!) 112 kg (247 lb) XYI:AXKP surface area is 2.25 meters squared. Body mass index is 42.4 kg/m.Marland Kitchen  GENERAL: Alert, active, oriented x3  HEENT: Pupils equal reactive to light. Extraocular movements are intact. Sclera clear. Palpebral conjunctiva normal red color.Pharynx clear.  NECK: Supple with no palpable mass and no adenopathy.  LUNGS: Sound clear with no rales rhonchi or wheezes.  HEART: Regular rhythm S1 and S2 without murmur.  BREAST: Both were examined in the supine position. There was no palpable masses, skin changes, nipple discharge or nipple retraction. There was no bilateral axillary adenopathy clinically.  ABDOMEN: Soft and depressible, nontender with no palpable mass, no hepatomegaly.   EXTREMITIES: Well-developed well-nourished symmetrical with no dependent edema.  NEUROLOGICAL: Awake alert oriented, facial expression symmetrical, moving all  extremities.   IMPRESSION:   Malignant neoplasm of upper-inner quadrant of right breast in female, estrogen receptor positive (CMS-HCC) [C50.211, Z17.0] -Invasive mammary carcinoma with lobular features -Bilateral breast MRI shows two other posterior masses that are invasive lobular carcinoma as well -After long discussion with patient about partial vs total mastectomy the patient elected to proceed with total mastectomy. She will also have sentinel lymph node biopsy  PLAN:  1. Right breast total mastectomy with sentinel lymph node biopsy  Patient verbalized understanding, all questions were answered, and were agreeable with the plan outlined above.   Herbert Pun, MD

## 2021-11-08 ENCOUNTER — Encounter: Payer: Self-pay | Admitting: General Surgery

## 2021-11-08 ENCOUNTER — Encounter: Payer: Self-pay | Admitting: *Deleted

## 2021-11-08 DIAGNOSIS — C50211 Malignant neoplasm of upper-inner quadrant of right female breast: Secondary | ICD-10-CM | POA: Diagnosis not present

## 2021-11-08 MED ORDER — HYDROCODONE-ACETAMINOPHEN 5-325 MG PO TABS: 1.0000 | ORAL_TABLET | ORAL | 0 refills | Status: DC | PRN

## 2021-11-08 MED ORDER — CELECOXIB 200 MG PO CAPS: 200.0000 mg | ORAL_CAPSULE | Freq: Two times a day (BID) | ORAL | 0 refills | Status: AC

## 2021-11-08 MED ORDER — GABAPENTIN 300 MG PO CAPS: 300.0000 mg | ORAL_CAPSULE | Freq: Two times a day (BID) | ORAL | 0 refills | Status: DC

## 2021-11-08 NOTE — Discharge Instructions (Signed)
°  Diet: Resume home heart healthy regular diet.   Activity: No heavy lifting >20 pounds (children, pets, laundry, garbage) or strenuous activity until follow-up, but light activity and walking are encouraged. Do not drive or drink alcohol if taking narcotic pain medications.  Wound care: May shower with soapy water and pat dry (do not rub incisions), but no baths or submerging incision underwater until follow-up. (no swimming)   Char drain output daily  Medications: Resume all home medications. For mild to moderate pain: acetaminophen (Tylenol) or ibuprofen (if no kidney disease). Combining Tylenol with alcohol can substantially increase your risk of causing liver disease. Narcotic pain medications, if prescribed, can be used for severe pain, though may cause nausea, constipation, and drowsiness. Do not combine Tylenol and Norco within a 6 hour period as Norco contains Tylenol. If you do not need the narcotic pain medication, you do not need to fill the prescription.  Call office [phone]) at any time if any questions, worsening pain, fevers/chills, bleeding, drainage from incision site, or other concerns.

## 2021-11-08 NOTE — Anesthesia Postprocedure Evaluation (Signed)
Anesthesia Post Note  Patient: Kelly Howell  Procedure(s) Performed: TOTAL MASTECTOMY WITH SENTINEL LYMPH NODE BIOPSY (Right: Breast)  Patient location during evaluation: PACU Anesthesia Type: General Level of consciousness: awake and alert Pain management: pain level controlled Vital Signs Assessment: post-procedure vital signs reviewed and stable Respiratory status: spontaneous breathing, nonlabored ventilation and respiratory function stable Cardiovascular status: blood pressure returned to baseline and stable Postop Assessment: no apparent nausea or vomiting Anesthetic complications: no   No notable events documented.   Last Vitals:  Vitals:   11/08/21 0951 11/08/21 1126  BP: (!) 138/55 (!) 122/48  Pulse: 73 67  Resp:  18  Temp:  36.9 C  SpO2: 95% 94%    Last Pain:  Vitals:   11/08/21 1126  TempSrc: Oral  PainSc:                  [place] Ouch

## 2021-11-08 NOTE — Progress Notes (Signed)
Called patient today.  She is status post surgery yesterday.  States she is doing well.  No complaints.  States she needs her follow up appointment with Dr. Jacinto Reap.  Will send his team a message to schedule her an appointment.

## 2021-11-08 NOTE — Progress Notes (Signed)
Mobility Specialist - Progress Note   11/08/21 1200  Mobility  Activity Ambulated with assistance in hallway  Level of Assistance Modified independent, requires aide device or extra time  Assistive Device Front wheel walker  Distance Ambulated (ft) 50 ft  Activity Response Tolerated well  $Mobility charge 1 Mobility    Pt ambulated in hallway with supervision. No LOB. Does voice pain in R incision site. Pt returned to room, left EOB with lunch tray set up in front of pt. Pt anticipating d/c this date.    Kathee Delton Mobility Specialist 11/08/21, 12:43 PM

## 2021-11-08 NOTE — Discharge Summary (Signed)
Patient ID: POLA FURNO MRN: [id number] DOB/AGE: May 15, 1946 76 y.o.  Admit date: 11/07/2021 Discharge date: 11/08/2021   Discharge Diagnoses:  Principal Problem:   Breast cancer [facility])   Procedures: Right total mastectomy with right axillary sentinel lymph node biopsy  Hospital Course: Patient with breast cancer.  She was admitted for surgical treatment of right breast cancer.  She underwent right total mastectomy and sentinel lymph node biopsy.  She tolerated the procedure well.  This morning patient with pain control.  Patient eating and tolerating diet.  Physical exam shows healthy skin flaps.  There is expected bruise with adequate capillary refill.  Physical Exam Constitutional:      Appearance: Normal appearance.  HENT:     Head: Normocephalic.  Cardiovascular:     Rate and Rhythm: Normal rate and regular rhythm.     Pulses: Normal pulses.     Heart sounds: Normal heart sounds.  Pulmonary:     Effort: Pulmonary effort is normal.  Abdominal:     General: Abdomen is flat.  Musculoskeletal:     Cervical back: Normal range of motion.  Skin:    General: Skin is warm.  Neurological:     Mental Status: She is alert.  Right chest with small lateral bruise with adequate capillary refill.  No significant fluid collection.   Consults: None  Disposition: Discharge disposition: 01-Home or Self Care       Discharge Instructions     Diet - low sodium heart healthy   Complete by: As directed    Increase activity slowly   Complete by: As directed       Allergies as of 11/08/2021       Reactions   Gantrisin [sulfisoxazole] Nausea Only   Sulfa Antibiotics Nausea Only   Tape Other (See Comments)        Medication List     TAKE these medications    acetaminophen 500 MG tablet Commonly known as: TYLENOL Take 1,000 mg by mouth every 6 (six) hours as needed for moderate pain.   anastrozole 1 MG tablet Commonly known as: ARIMIDEX Take 1 tablet (1 mg total) by  mouth daily.   aspirin 81 MG chewable tablet Chew 81 mg by mouth daily.   atorvastatin 20 MG tablet Commonly known as: LIPITOR Take 20 mg by mouth at bedtime.   celecoxib 200 MG capsule Commonly known as: CELEBREX Take 1 capsule (200 mg total) by mouth 2 (two) times daily for 5 days.   cetirizine 10 MG tablet Commonly known as: ZYRTEC Take 10 mg by mouth daily as needed for allergies.   Coenzyme Q10 100 MG capsule Take 100 mg by mouth daily.   cyanocobalamin 1000 MCG/ML injection Commonly known as: (VITAMIN B-12) Inject 1,000 mcg into the muscle every 30 (thirty) days.   fluticasone 50 MCG/ACT nasal spray Commonly known as: FLONASE Place 2 sprays into both nostrils at bedtime as needed for rhinitis.   gabapentin 300 MG capsule Commonly known as: NEURONTIN Take 1 capsule (300 mg total) by mouth 2 (two) times daily for 5 days.   HYDROcodone-acetaminophen 5-325 MG tablet Commonly known as: NORCO/VICODIN Take 1 tablet by mouth every 4 (four) hours as needed for moderate pain.   lisinopril 20 MG tablet Commonly known as: ZESTRIL Take 20 mg by mouth daily.   metoprolol tartrate 25 MG tablet Commonly known as: LOPRESSOR Take 25 mg by mouth 2 (two) times daily.   naproxen 500 MG tablet Commonly known as: NAPROSYN Take 500 mg  by mouth 2 (two) times daily with a meal.   nystatin cream Commonly known as: MYCOSTATIN Apply 1 application topically daily as needed (Yeast).   Vitamin D3 25 MCG tablet Commonly known as: Vitamin D Take 1,000 Units by mouth daily.        Follow-up Information     Herbert Pun, MD Follow up in 1 week(s).   Specialty: General Surgery Contact information: [address] [place] [address] [phone]

## 2021-11-08 NOTE — Plan of Care (Signed)
Pt is alert and oriented x 4. Got up to bsc with standby. Pt tolerated supper snack tray once on unit. Pt currently is 0. 50cc removed from JP drain.  Problem: Education: Goal: Knowledge of General Education information will improve Description: Including pain rating scale, medication(s)/side effects and non-pharmacologic comfort measures Outcome: Progressing   Problem: Health Behavior/Discharge Planning: Goal: Ability to manage health-related needs will improve Outcome: Progressing   Problem: Clinical Measurements: Goal: Ability to maintain clinical measurements within normal limits will improve Outcome: Progressing Goal: Will remain free from infection Outcome: Progressing Goal: Diagnostic test results will improve Outcome: Progressing Goal: Respiratory complications will improve Outcome: Progressing Goal: Cardiovascular complication will be avoided Outcome: Progressing   Problem: Activity: Goal: Risk for activity intolerance will decrease Outcome: Progressing   Problem: Nutrition: Goal: Adequate nutrition will be maintained Outcome: Progressing   Problem: Coping: Goal: Level of anxiety will decrease Outcome: Progressing   Problem: Elimination: Goal: Will not experience complications related to bowel motility Outcome: Progressing Goal: Will not experience complications related to urinary retention Outcome: Progressing   Problem: Pain Managment: Goal: General experience of comfort will improve Outcome: Progressing   Problem: Safety: Goal: Ability to remain free from injury will improve Outcome: Progressing   Problem: Skin Integrity: Goal: Risk for impaired skin integrity will decrease Outcome: Progressing   Problem: Education: Goal: Required Educational Video(s) Outcome: Progressing   Problem: Clinical Measurements: Goal: Postoperative complications will be avoided or minimized Outcome: Progressing   Problem: Skin Integrity: Goal: Demonstration of wound  healing without infection will improve Outcome: Progressing

## 2021-11-10 ENCOUNTER — Other Ambulatory Visit: Payer: Self-pay | Admitting: Pathology

## 2021-11-10 LAB — SURGICAL PATHOLOGY

## 2021-11-23 ENCOUNTER — Other Ambulatory Visit: Payer: Self-pay

## 2021-11-23 ENCOUNTER — Encounter: Payer: Self-pay | Admitting: *Deleted

## 2021-11-23 ENCOUNTER — Encounter: Payer: Self-pay | Admitting: Internal Medicine

## 2021-11-23 ENCOUNTER — Inpatient Hospital Stay: Payer: Medicare Other | Attending: Internal Medicine | Admitting: Internal Medicine

## 2021-11-23 DIAGNOSIS — C50811 Malignant neoplasm of overlapping sites of right female breast: Secondary | ICD-10-CM | POA: Diagnosis present

## 2021-11-23 DIAGNOSIS — Z170 Estrogen receptor positive status [ER+]: Secondary | ICD-10-CM | POA: Diagnosis not present

## 2021-11-23 DIAGNOSIS — M8580 Other specified disorders of bone density and structure, unspecified site: Secondary | ICD-10-CM | POA: Diagnosis not present

## 2021-11-23 MED ORDER — EXEMESTANE 25 MG PO TABS: 25.0000 mg | ORAL_TABLET | Freq: Every day | ORAL | 3 refills | Status: DC

## 2021-11-23 NOTE — Progress Notes (Signed)
.[facility] [place] NOTE  Patient Care Team: [place] Pink, MD as PCP - General (Family Medicine) Theodore Demark, RN as Oncology Nurse Navigator Cammie Sickle, MD as Consulting Physician (Internal Medicine) Herbert Pun, MD as Consulting Physician (General Surgery) Noreene Filbert, MD as Referring Physician (Radiation Oncology) Rico Junker, RN as Oncology Nurse Navigator  CHIEF COMPLAINTS/PURPOSE OF CONSULTATION: DCIS  #  Oncology History Overview Note  # 2005- left breast lumpectomy [Dr.Craford; Dr.Chokis; II opinion [facility]- hyperplasia] Tam x5 years   # 2022-LEFT BREAST DCIS x2; #A] Bx-cannot rule out microinvasive disease; ER positive; Dr.Cintron  # DEC 2022- RIGHT BREAST [place] with lobular features; status post biopsy RIGHT BREAST Early stage  ER/PR POSITIVE; Her2 NEGATIVE.  MRI January 2023-shows small cluster [subcentimeter x3]   PATHOLOGIC STAGE CLASSIFICATION (pTNM, AJCC 8th Edition):  Modified Classification: Not applicable  pT Category: pT1c  T Suffix: (m) multiple primary synchronous tumors in a single organ  Regional Lymph Nodes Modifier: (sn) Sentinel nodes evaluated  pN Category: pN0  pM Category: Not applicable   Patient had severe intolerance to anastrozole [started on left DCIS].  I would recommend Aromasin.   # FEB 15th, 2023- START AROMASIN   ---------------  BREAST, LEFT; EXCISION:  - DUCTAL CARCINOMA IN SITU, INTERMEDIATE GRADE, WITH COMEDONECROSIS AND  ASSOCIATED CALCIFICATIONS.  - ASSOCIATED ATYPICAL LOBULAR HYPERPLASIA.  - TWO CLIPS AND TWO BIOPSY SITES IDENTIFIED.  - NO EVIDENCE OF INVASIVE CARCINOMA.  - SEE CANCER SUMMARY.   B. LYMPH NODE, LEFT AXILLARY SENTINEL; EXCISION:  - ONE LYMPH NODE, NEGATIVE FOR MALIGNANCY (0/1).   # AUG, 3rd 2022-anastrozole x6 weeks; STOP SEP 2022 [sec to [facility]]   # SURVIVORSHIP:   # GENETICS:   DIAGNOSIS:   STAGE:         ;  GOALS:  CURRENT/MOST RECENT THERAPY :      Ductal carcinoma in situ (DCIS) of left breast  11/09/2020 Initial Diagnosis   Ductal carcinoma in situ (DCIS) of left breast   12/03/2020 Cancer Staging   Staging form: Breast, AJCC 8th Edition - Clinical: Stage 0 (cTis (DCIS), cN0, cM0, G2, ER+) - Signed by Cammie Sickle, MD on 12/03/2020 Stage prefix: Initial diagnosis    Carcinoma of overlapping sites of right breast in female, estrogen receptor positive ([place])  10/18/2021 Initial Diagnosis   Carcinoma of overlapping sites of right breast in female, estrogen receptor positive ([place])   11/23/2021 Cancer Staging   Staging form: Breast, AJCC 8th Edition - Pathologic: Stage IA (pT1c, pN0, cM0, G2, ER+, PR+, HER2-) - Signed by Cammie Sickle, MD on 11/23/2021 Histologic grading system: 3 grade system       HISTORY OF PRESENTING ILLNESS: Ambulating with wheelchair.  Alone.  Kelly Howell 76 y.o.  female early-stage breast cancer is here to review the results of her pathology postmastectomy/and treatment plan.  Patient denies any significant postoperative complications.  Complains of pain in the right arm and swelling.  Patient has been icing for the last few days.  S/p evaluation with Cintron.  Improved.  Not resolved.  Review of Systems  Constitutional:  Negative for chills, diaphoresis, fever and weight loss.  HENT:  Negative for nosebleeds and sore throat.   Eyes:  Negative for double vision.  Respiratory:  Negative for cough, hemoptysis, sputum production, shortness of breath and wheezing.   Cardiovascular:  Negative for chest pain, palpitations and orthopnea.  Gastrointestinal:  Negative for abdominal pain, blood in stool, constipation, diarrhea,  heartburn, melena, nausea and vomiting.  Genitourinary:  Negative for dysuria, frequency and urgency.  Musculoskeletal:  Positive for back pain and joint pain.  Skin:  Negative for itching.  Neurological:  Negative for dizziness, tingling, focal weakness, weakness and  headaches.  Endo/Heme/Allergies:  Does not bruise/bleed easily.  Psychiatric/Behavioral:  Negative for depression. The patient is not nervous/anxious and does not have insomnia.     MEDICAL HISTORY:  Past Medical History:  Diagnosis Date   Abnormal mammogram    Atypical hyperplasia of breast    Left   Breast cancer ([place]) 2000   left lumpectomy   Colon polyps    COVID    09/2021   Dumping syndrome    Dyspnea    Dysrhythmia    pvc   Hyperlipidemia    Hypertension    IBS (irritable bowel syndrome)    Intermittent constipation    Intermittent diarrhea    Left sided lacunar infarction ([place])    per pt 11/04/21 testing at [facility] thought to be inner ear problem   OA (osteoarthritis)    Obesity    Personal history of radiation therapy    Torus palatinus     SURGICAL HISTORY: Past Surgical History:  Procedure Laterality Date   BREAST BIOPSY Left 2000   Positive   BREAST BIOPSY Left 11/03/2020   affirm bx ,  posterior group coil marker, DCIS NUCLEAR GRADE 2 WITH COMEDONECROSIS AND ASSOCIATED CALCIFICATIONS  ATYPICAL LOBULAR HYPERPLASIA   BREAST BIOPSY Left 11/03/2020   affirm bx, anterior group x marker, DCIS NUCLEAR GRADE 2 WITH COMEDONECROSIS AND ASSOCIATED CALCIFICATIONS  ATYPICAL LOBULAR HYPERPLASIA   BREAST BIOPSY Right 10/04/2021   Stereo Bx, X-clip, path pending   BREAST LUMPECTOMY Left 2000   positive for breast ca   BREAST LUMPECTOMY Left 11/19/2020   NL with SN DCIS   CESAREAN SECTION     CHOLECYSTECTOMY     COLONOSCOPY     COLONOSCOPY WITH PROPOFOL N/A 07/04/2016   Procedure: COLONOSCOPY WITH PROPOFOL;  Surgeon: Lollie Sails, MD;  Location: [facility] ENDOSCOPY;  Service: Endoscopy;  Laterality: N/A;   COLONOSCOPY WITH PROPOFOL N/A 07/21/2021   Procedure: COLONOSCOPY WITH PROPOFOL;  Surgeon: Annamaria Helling, DO;  Location: [facility] ENDOSCOPY;  Service: Gastroenterology;  Laterality: N/A;   Left Wrist Biopsy     pt denies 11/04/21    PARTIAL MASTECTOMY WITH NEEDLE LOCALIZATION AND AXILLARY SENTINEL LYMPH NODE BX Left 11/19/2020   Procedure: PARTIAL MASTECTOMY WITH NEEDLE LOCALIZATION AND AXILLARY SENTINEL LYMPH NODE BX;  Surgeon: Herbert Pun, MD;  Location: [facility] ORS;  Service: General;  Laterality: Left;   TOTAL MASTECTOMY Right 11/07/2021   Procedure: TOTAL MASTECTOMY WITH SENTINEL LYMPH NODE BIOPSY;  Surgeon: Herbert Pun, MD;  Location: [facility] ORS;  Service: General;  Laterality: Right;    SOCIAL HISTORY: Social History   Socioeconomic History   Marital status: Married    Spouse name: Not on file   Number of children: Not on file   Years of education: Not on file   Highest education level: Not on file  Occupational History   Not on file  Tobacco Use   Smoking status: Never   Smokeless tobacco: Never  Vaping Use   Vaping Use: Never used  Substance and Sexual Activity   Alcohol use: Never   Drug use: Never   Sexual activity: Not Currently  Other Topics Concern   Not on file  Social History Narrative   Lives in [place]. 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 No  smoking; no alcohol. With husband; 2 sons x twins [down's sydrome]; walk with cane because of knee pain.    Social Determinants of Health   Financial Resource Strain: Not on file  Food Insecurity: Not on file  Transportation Needs: Not on file  Physical Activity: Not on file  Stress: Not on file  Social Connections: Not on file  Intimate Partner Violence: Not on file    FAMILY HISTORY: Family History  Problem Relation Age of Onset   Colon cancer Mother    Breast cancer Maternal Aunt 65   Breast cancer Paternal Grandmother 52    ALLERGIES:  is allergic to gantrisin [sulfisoxazole], sulfa antibiotics, and tape.  MEDICATIONS:  Current Outpatient Medications  Medication Sig Dispense Refill   acetaminophen (TYLENOL) 500 MG tablet Take 1,000 mg by mouth every 6 (six) hours as needed for moderate pain.     aspirin 81 MG  chewable tablet Chew 81 mg by mouth daily.     atorvastatin (LIPITOR) 20 MG tablet Take 20 mg by mouth at bedtime.     cetirizine (ZYRTEC) 10 MG tablet Take 10 mg by mouth daily as needed for allergies.     Coenzyme Q10 100 MG capsule Take 100 mg by mouth daily.     cyanocobalamin (,VITAMIN B-12,) 1000 MCG/ML injection Inject 1,000 mcg into the muscle every 30 (thirty) days.     exemestane (AROMASIN) 25 MG tablet Take 1 tablet (25 mg total) by mouth daily after breakfast. 30 tablet 3   fluticasone (FLONASE) 50 MCG/ACT nasal spray Place 2 sprays into both nostrils at bedtime as needed for rhinitis.     HYDROcodone-acetaminophen (NORCO/VICODIN) 5-325 MG tablet Take 1 tablet by mouth every 4 (four) hours as needed for moderate pain. 30 tablet 0   lisinopril (PRINIVIL,ZESTRIL) 20 MG tablet Take 20 mg by mouth daily.     metoprolol tartrate (LOPRESSOR) 25 MG tablet Take 25 mg by mouth 2 (two) times daily.     naproxen (NAPROSYN) 500 MG tablet Take 500 mg by mouth 2 (two) times daily with a meal.     nystatin cream (MYCOSTATIN) Apply 1 application topically daily as needed (Yeast).     Vitamin D3 (VITAMIN D) 25 MCG tablet Take 1,000 Units by mouth daily.     gabapentin (NEURONTIN) 300 MG capsule Take 1 capsule (300 mg total) by mouth 2 (two) times daily for 5 days. 10 capsule 0   No current facility-administered medications for this visit.      Marland Kitchen  PHYSICAL EXAMINATION: ECOG PERFORMANCE STATUS: 0 - Asymptomatic  Vitals:   11/23/21 1107  BP: (!) 143/60  Pulse: 70  Temp: 97.8 F (36.6 C)  SpO2: 100%   Filed Weights   11/23/21 1107  Weight: 238 lb 14.4 oz (108.4 kg)    Physical Exam HENT:     Head: Normocephalic and atraumatic.     Mouth/Throat:     Pharynx: No oropharyngeal exudate.  Eyes:     Pupils: Pupils are equal, round, and reactive to light.  Cardiovascular:     Rate and Rhythm: Normal rate and regular rhythm.  Pulmonary:     Effort: Pulmonary effort is  normal. No respiratory distress.     Breath sounds: Normal breath sounds. No wheezing.  Abdominal:     General: Bowel sounds are normal. There is no distension.     Palpations: Abdomen is soft. There is no mass.     Tenderness: There is no abdominal tenderness. There is no guarding  or rebound.  Musculoskeletal:        General: No tenderness. Normal range of motion.     Cervical back: Normal range of motion and neck supple.  Skin:    General: Skin is warm.  Neurological:     Mental Status: She is alert and oriented to person, place, and time.  Psychiatric:        Mood and Affect: Affect normal.     LABORATORY DATA:  I have reviewed the data as listed Lab Results  Component Value Date   WBC 5.7 10/18/2021   HGB 12.9 10/18/2021   HCT 37.8 10/18/2021   MCV 97.7 10/18/2021   PLT 244 10/18/2021   Recent Labs    10/18/21 1302  NA 137  K 3.9  CL 108  CO2 23  GLUCOSE 118*  BUN 29*  CREATININE 1.00  CALCIUM 8.7*  GFRNONAA 59*  PROT 6.5  ALBUMIN 3.6  AST 19  ALT 17  ALKPHOS 75  BILITOT 0.7    RADIOGRAPHIC STUDIES: I have personally reviewed the radiological images as listed and agreed with the findings in the report. NM Sentinel Node Inj-No Rpt (Breast)  Result Date: 11/07/2021 Sulfur Colloid was injected by the Nuclear Medicine Technologist for sentinel lymph node localization.   MM CLIP PLACEMENT RIGHT  Result Date: 10/27/2021 CLINICAL DATA:  Status post MRI biopsy of the right breast. EXAM: 3D DIAGNOSTIC RIGHT MAMMOGRAM POST MRI BIOPSY COMPARISON:  Previous exam(s). FINDINGS: 3D Mammographic images were obtained following MRI guided biopsy of the right breast. The biopsy marking clip is in expected position at the site of biopsy. IMPRESSION: Appropriate positioning of the cylinder shaped biopsy marking clip at the site of biopsy in the deep central right breast. Final Assessment: Post Procedure Mammograms for Marker Placement Electronically Signed   By: Kristopher Oppenheim  M.D.   On: 10/27/2021 10:44  MR RT BREAST BX W LOC DEV 1ST LESION IMAGE BX SPEC MR GUIDE  Addendum Date: 11/04/2021   ADDENDUM REPORT: 11/04/2021 08:39 ADDENDUM: Pathology revealed GRADE 1/2 INVASIVE MAMMARY CARCINOMA, MAMMARY CARCINOMA IN SITU of the RIGHT breast, deep central (cylinder clip). Note: ADDENDUM: E-Cadherin immunostain is negative, supporting a lobular phenotype. This was found to be concordant by Dr. Kristopher Oppenheim. Pathology results were discussed with the patient by telephone. The patient reported doing well after the biopsy with tenderness at the site. Post biopsy instructions and care were reviewed and questions were answered. The patient was encouraged to call [facility] for any additional concerns. The patient has a recent diagnosis of right breast cancer and should follow her outlined treatment plan. Pathology results reported by Stacie Acres RN on 11/03/2021. Electronically Signed   By: Kristopher Oppenheim M.D.   On: 11/04/2021 08:39   Result Date: 11/04/2021 CLINICAL DATA:  76 year old female with newly diagnosed right breast cancer presents for MRI guided biopsy of additional right breast masses. EXAM: MRI GUIDED CORE NEEDLE BIOPSY OF THE RIGHT BREAST TECHNIQUE: Multiplanar, multisequence MR imaging of the right breast was performed both before and after administration of intravenous contrast. CONTRAST:  87m GADAVIST GADOBUTROL 1 MMOL/ML IV SOLN COMPARISON:  Previous exams. FINDINGS: I met with the patient, and we discussed the procedure of MRI guided biopsy, including risks, benefits, and alternatives. Specifically, we discussed the risks of infection, bleeding, tissue injury, clip migration, and inadequate sampling. Informed, written consent was given. The usual time out protocol was performed immediately prior to the procedure. Using sterile technique, 1% Lidocaine,  MRI guidance, and a 9 gauge vacuum assisted device, biopsy was performed of adjacent masses in  the deep central right breast using a lateral approach. At the conclusion of the procedure, a cylinder shaped tissue marker clip was deployed into the biopsy cavity. Follow-up 2-view mammogram was performed and dictated separately. IMPRESSION: MRI guided biopsy of the right breast.  No apparent complications. Electronically Signed: By: Kristopher Oppenheim M.D. On: 10/27/2021 10:43   ASSESSMENT & PLAN:   Carcinoma of overlapping sites of right breast in female, estrogen receptor positive ([place]) #  RIGHT BREAST STAGE I ER/PR POSITIVE; Her2 NEGATIVE.s/p Mastectomy [Dr.Cintron]; pmT1c sentinel lymph node negative.  Grade 2.  Patient poor candidate for chemotherapy-would not check Oncotype.  Clinically low risk.  Discussed no role for any adjuvant radiation therapy since s/p mastectomy.  #Recommend adjuvant AI for 5 years.  I discussed the rationale for using adjuvant therapy plans.  Discussed the mechanism of action of aromatase inhibitors-with blocking of estrogen to prevent breast cancer.  Also discussed the potential side effects including but not limited to arthralgias hot flashes and increased risk of osteoporosis.  Given previous poor tolerance to anastrozole; would recommend Aromasin.  Discussed the possible expensive; she noted to follow-up/will refer to [name] if needed.  # RIght arm swelling pain- likley post op- no clinically evidence of DVT or cellulitis- continue icing; awaiting to see Dr.Cintron in 2 days.   #Osteopenia : Worse- bone density [June-- 2022- T-score of -2.0.; June 2020 [T score -1.3]-  On Vit D 1000/day. [not calcium-] for weight loss today; given upcoming surgery.  We will reschedule again in near future.  #Prognosis natural history: Of breast cancer were discussed at length; patient's likelihood of cure cancer is in the order of 80 to 90% [in absence of Oncotype].  Would recommend endocrine therapy as discussed above.    DISPOSITION: # follow in 1 month-MD; no labs- - Dr.B   All  questions were answered. The patient/family knows to call the clinic with any problems, questions or concerns.   Cammie Sickle, MD 11/23/2021 11:48 AM

## 2021-11-23 NOTE — Progress Notes (Signed)
Met patient today during her follow up appointment with Dr. Rogue Bussing.  She is status post right mastectomy.  Drain is still present.  She is complaining about right arm pain.  States Dr. Peyton Najjar is aware of her discomfort.  She has a follow up appointment with him on Friday.  Encouraged to discuss with him again if her discomfort is getting worse or not improving.  She is agreeable.  Patient to start an AI.  Will follow up with her in a couple of weeks.

## 2021-11-23 NOTE — Progress Notes (Signed)
Pt states she is having pain that comes and goes on the rt arm/arm pit/breast area that comes and goes.

## 2021-11-23 NOTE — Assessment & Plan Note (Addendum)
#  RIGHT BREAST STAGE I ER/PR POSITIVE; Her2 NEGATIVE.s/p Mastectomy [Dr.Cintron]; pmT1c sentinel lymph node negative.  Grade 2.  Patient poor candidate for chemotherapy-would not check Oncotype.  Clinically low risk.  Discussed no role for any adjuvant radiation therapy since s/p mastectomy.  #Recommend adjuvant AI for 5 years.  I discussed the rationale for using adjuvant therapy plans.  Discussed the mechanism of action of aromatase inhibitors-with blocking of estrogen to prevent breast cancer.  Also discussed the potential side effects including but not limited to arthralgias hot flashes and increased risk of osteoporosis.  Given previous poor tolerance to anastrozole; would recommend Aromasin.  Discussed the possible expensive; she noted to follow-up/will refer to [name] if needed.  # RIght arm swelling pain- likley post op- no clinically evidence of DVT or cellulitis- continue icing; awaiting to see Dr.Cintron in 2 days.   #Osteopenia : Worse- bone density [June-- 2022- T-score of -2.0.; June 2020 [T score -1.3]-  On Vit D 1000/day. [not calcium-] for weight loss today; given upcoming surgery.  We will reschedule again in near future.  #Prognosis natural history: Of breast cancer were discussed at length; patient's likelihood of cure cancer is in the order of 80 to 90% [in absence of Oncotype].  Would recommend endocrine therapy as discussed above.    DISPOSITION: # follow in 1 month-MD; no labs- - Dr.B

## 2021-11-25 ENCOUNTER — Encounter: Payer: Self-pay | Admitting: *Deleted

## 2021-11-25 NOTE — Progress Notes (Signed)
Patient called yesterday and states she is not going to be able to afford the Exemestane.  States it cost $178/month.  I did speak to [facility] our Pharmacist that helps with oral meds, and she said there are no grants available right now and no assistance programs for that drug.  Sent a message to Dr. Rogue Bussing with the patient's situation and asked if we need to change prescription.  Left patient a message that Dr. Rogue Bussing had been notified and either I would call her back or sometimes he will call the patient.

## 2021-11-28 ENCOUNTER — Encounter: Payer: Self-pay | Admitting: *Deleted

## 2021-11-28 NOTE — Progress Notes (Signed)
Patient called this morning and states she was able to find her prescription for Exemestane at [facility] for $30/month.  She has her prescription and is starting today.  Will notify Dr. Rogue Bussing.

## 2021-12-14 ENCOUNTER — Encounter: Payer: Self-pay | Admitting: *Deleted

## 2021-12-14 NOTE — Progress Notes (Signed)
Called to see how patient is tolerating her AI's.  Left message for her to return my call. ?

## 2021-12-16 ENCOUNTER — Encounter: Payer: Self-pay | Admitting: General Surgery

## 2021-12-21 ENCOUNTER — Inpatient Hospital Stay: Payer: Medicare Other | Attending: Internal Medicine | Admitting: Internal Medicine

## 2021-12-21 ENCOUNTER — Encounter: Payer: Self-pay | Admitting: Internal Medicine

## 2021-12-21 ENCOUNTER — Other Ambulatory Visit: Payer: Self-pay

## 2021-12-21 DIAGNOSIS — C50811 Malignant neoplasm of overlapping sites of right female breast: Secondary | ICD-10-CM | POA: Diagnosis present

## 2021-12-21 DIAGNOSIS — Z79811 Long term (current) use of aromatase inhibitors: Secondary | ICD-10-CM | POA: Diagnosis not present

## 2021-12-21 DIAGNOSIS — Z170 Estrogen receptor positive status [ER+]: Secondary | ICD-10-CM | POA: Diagnosis not present

## 2021-12-21 DIAGNOSIS — M8580 Other specified disorders of bone density and structure, unspecified site: Secondary | ICD-10-CM | POA: Diagnosis not present

## 2021-12-21 DIAGNOSIS — N951 Menopausal and female climacteric states: Secondary | ICD-10-CM | POA: Diagnosis not present

## 2021-12-21 NOTE — Progress Notes (Signed)
11/25/21 drain taken out, 12/08/21 incision opened and had fluid drainage, drain tube placed, 3/9 tube taken out, wound still open with some drainage. ? ?Concerned that aromasin may be causing joint/knee pain. ? ?C/O thick area left neck area. Noticed x1 week. ? ?

## 2021-12-21 NOTE — Progress Notes (Signed)
[facility] ?[facility] [place] ?CONSULT NOTE ? ?Patient Care Team: ?Maryland Pink, MD as PCP - General (Family Medicine) ?Theodore Demark, RN as Oncology Nurse Navigator ?Cammie Sickle, MD as Consulting Physician (Internal Medicine) ?Herbert Pun, MD as Consulting Physician (General Surgery) ?Noreene Filbert, MD as Referring Physician (Radiation Oncology) ?Rico Junker, RN as Oncology Nurse Navigator ? ?CHIEF COMPLAINTS/PURPOSE OF CONSULTATION: breast cancer ? ?#  ?Oncology History Overview Note  ?# 2005- left breast lumpectomy [Dr.Craford; Dr.Chokis; II opinion [facility]- hyperplasia] Tam x5 years ? ? # 2022-LEFT BREAST DCIS x2; #A] Bx-cannot rule out microinvasive disease; ER positive; Dr.Cintron ? ?# DEC 2022- RIGHT BREAST [place] with lobular features; status post biopsy RIGHT BREAST Early stage  ER/PR POSITIVE; Her2 NEGATIVE.  MRI January 2023-shows small cluster [subcentimeter x3]  ? ?PATHOLOGIC STAGE CLASSIFICATION (pTNM, AJCC 8th Edition):  ?Modified Classification: Not applicable  ?pT Category: pT1c  ?T Suffix: (m) multiple primary synchronous tumors in a single organ  ?Regional Lymph Nodes Modifier: (sn) Sentinel nodes evaluated  ?pN Category: pN0  ?pM Category: Not applicable  ? ?Patient had severe intolerance to anastrozole [started on left DCIS].  I would recommend Aromasin.  ? ?# FEB 15th, 2023- START AROMASIN  ? ?--------------- ? ?BREAST, LEFT; EXCISION:  ?- DUCTAL CARCINOMA IN SITU, INTERMEDIATE GRADE, WITH COMEDONECROSIS AND  ?ASSOCIATED CALCIFICATIONS.  ?- ASSOCIATED ATYPICAL LOBULAR HYPERPLASIA.  ?- TWO CLIPS AND TWO BIOPSY SITES IDENTIFIED.  ?- NO EVIDENCE OF INVASIVE CARCINOMA.  ?- SEE CANCER SUMMARY.  ? ?B. LYMPH NODE, LEFT AXILLARY SENTINEL; EXCISION:  ?- ONE LYMPH NODE, NEGATIVE FOR MALIGNANCY (0/1).  ? ?# AUG, 3rd 2022-anastrozole x6 weeks; STOP SEP 2022 [sec to [facility]] ? ? ?# SURVIVORSHIP:  ? ?# GENETICS:  ? ?DIAGNOSIS:  ? ?STAGE:         ;  GOALS: ? ?CURRENT/MOST RECENT  THERAPY :  ? ?  ?Ductal carcinoma in situ (DCIS) of left breast  ?11/09/2020 Initial Diagnosis  ? Ductal carcinoma in situ (DCIS) of left breast ?  ?12/03/2020 Cancer Staging  ? Staging form: Breast, AJCC 8th Edition ?- Clinical: Stage 0 (cTis (DCIS), cN0, cM0, G2, ER+) - Signed by Cammie Sickle, MD on 12/03/2020 ?Stage prefix: Initial diagnosis ? ?  ?Carcinoma of overlapping sites of right breast in female, estrogen receptor positive ([place])  ?10/18/2021 Initial Diagnosis  ? Carcinoma of overlapping sites of right breast in female, estrogen receptor positive ([place]) ?  ?11/23/2021 Cancer Staging  ? Staging form: Breast, AJCC 8th Edition ?- Pathologic: Stage IA (pT1c, pN0, cM0, G2, ER+, PR+, HER2-) - Signed by Cammie Sickle, MD on 11/23/2021 ?Histologic grading system: 3 grade system ? ?  ? ?HISTORY OF PRESENTING ILLNESS: Ambulating with wheelchair.  Alone. ? ?Kelly Howell 76 y.o.  female stage I ER/PR positive; her 2 Neg breast cancer postmastectomy currently on adjuvant Aomasin is here for follow-up. ? ?Patient noted to have a seroma of the right breast status post evacuation by Dr. Peyton Najjar.  ? ?Patient is able to tolerate Aromasin without any leg swelling.  Denies any nausea.  Denies any worsening joint pains.  Complains of hot flashes. ? ?Review of Systems  ?Constitutional:  Negative for chills, diaphoresis, fever and weight loss.  ?HENT:  Negative for nosebleeds and sore throat.   ?Eyes:  Negative for double vision.  ?Respiratory:  Negative for cough, hemoptysis, sputum production, shortness of breath and wheezing.   ?Cardiovascular:  Negative for chest pain, palpitations and orthopnea.  ?Gastrointestinal:  Negative for abdominal pain, blood in stool, constipation, diarrhea, heartburn, melena, nausea and vomiting.  ?Genitourinary:  Negative for dysuria, frequency and urgency.  ?Musculoskeletal:  Positive for back pain and joint pain.  ?Skin:  Negative for itching.  ?Neurological:  Negative for dizziness,  tingling, focal weakness, weakness and headaches.  ?Endo/Heme/Allergies:  Does not bruise/bleed easily.  ?Psychiatric/Behavioral:  Negative for depression. The patient is not nervous/anxious and does not have insomnia.    ? ?MEDICAL HISTORY:  ?Past Medical History:  ?Diagnosis Date  ?? Abnormal mammogram   ?? Atypical hyperplasia of breast   ? Left  ?? Breast cancer ([place]) 2000  ? left lumpectomy  ?? Colon polyps   ?? COVID   ? 09/2021  ?? Dumping syndrome   ?? Dyspnea   ?? Dysrhythmia   ? pvc  ?? Hyperlipidemia   ?? Hypertension   ?? IBS (irritable bowel syndrome)   ?? Intermittent constipation   ?? Intermittent diarrhea   ?? Left sided lacunar infarction [facility])   ? per pt 11/04/21 testing at [facility] thought to be inner ear problem  ?? OA (osteoarthritis)   ?? Obesity   ?? Personal history of radiation therapy   ?? Torus palatinus   ? ? ?SURGICAL HISTORY: ?Past Surgical History:  ?Procedure Laterality Date  ?? BREAST BIOPSY Left 2000  ? Positive  ?? BREAST BIOPSY Left 11/03/2020  ? affirm bx ,  posterior group coil marker, DCIS NUCLEAR GRADE 2 WITH COMEDONECROSIS AND ASSOCIATED CALCIFICATIONS  ATYPICAL LOBULAR HYPERPLASIA  ?? BREAST BIOPSY Left 11/03/2020  ? affirm bx, anterior group x marker, DCIS NUCLEAR GRADE 2 WITH COMEDONECROSIS AND ASSOCIATED CALCIFICATIONS  ATYPICAL LOBULAR HYPERPLASIA  ?? BREAST BIOPSY Right 10/04/2021  ? Stereo Bx, X-clip, path pending  ?? BREAST LUMPECTOMY Left 2000  ? positive for breast ca  ?? BREAST LUMPECTOMY Left 11/19/2020  ? NL with SN DCIS  ?? CESAREAN SECTION    ?? CHOLECYSTECTOMY    ?? COLONOSCOPY    ?? COLONOSCOPY WITH PROPOFOL N/A 07/04/2016  ? Procedure: COLONOSCOPY WITH PROPOFOL;  Surgeon: Lollie Sails, MD;  Location: [facility] ENDOSCOPY;  Service: Endoscopy;  Laterality: N/A;  ?? COLONOSCOPY WITH PROPOFOL N/A 07/21/2021  ? Procedure: COLONOSCOPY WITH PROPOFOL;  Surgeon: Annamaria Helling, DO;  Location: [facility] ENDOSCOPY;  Service: Gastroenterology;  Laterality: N/A;  ?? Left  Wrist Biopsy    ? pt denies 11/04/21  ?? PARTIAL MASTECTOMY WITH NEEDLE LOCALIZATION AND AXILLARY SENTINEL LYMPH NODE BX Left 11/19/2020  ? Procedure: PARTIAL MASTECTOMY WITH NEEDLE LOCALIZATION AND AXILLARY SENTINEL LYMPH NODE BX;  Surgeon: Herbert Pun, MD;  Location: [facility] ORS;  Service: General;  Laterality: Left;  ?? TOTAL MASTECTOMY Right 11/07/2021  ? Procedure: TOTAL MASTECTOMY WITH SENTINEL LYMPH NODE BIOPSY;  Surgeon: Herbert Pun, MD;  Location: [facility] ORS;  Service: General;  Laterality: Right;  ? ? ?SOCIAL HISTORY: ?Social History  ? ?Socioeconomic History  ?? Marital status: Married  ?  Spouse name: Not on file  ?? Number of children: Not on file  ?? Years of education: Not on file  ?? Highest education level: Not on file  ?Occupational History  ?? Not on file  ?Tobacco Use  ?? Smoking status: Never  ?? Smokeless tobacco: Never  ?Vaping Use  ?? Vaping Use: Never used  ?Substance and Sexual Activity  ?? Alcohol use: Never  ?? Drug use: Never  ?? Sexual activity: Not Currently  ?Other Topics Concern  ?? Not on file  ?Social History Narrative  ?  Lives in [place]. 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   ? ?Social Determinants of Health  ? ?Financial Resource Strain: Not on file  ?Food Insecurity: Not on file  ?Transportation Needs: Not on file  ?Physical Activity: Not on file  ?Stress: Not on file  ?Social Connections: Not on file  ?Intimate Partner Violence: Not on file  ? ? ?FAMILY HISTORY: ?Family History  ?Problem Relation Age of Onset  ?? Colon cancer Mother   ?? Breast cancer Maternal Aunt 65  ?? Breast cancer Paternal Grandmother 21  ? ? ?ALLERGIES:  is allergic to gantrisin [sulfisoxazole], sulfa antibiotics, and tape. ? ?MEDICATIONS:  ?Current Outpatient Medications  ?Medication Sig Dispense Refill  ?? acetaminophen (TYLENOL) 500 MG tablet Take 1,000 mg by mouth every 6 (six) hours as needed for  moderate pain.    ?? aspirin 81 MG chewable tablet Chew 81 mg by mouth daily.    ?? atorvastatin (LIPITOR) 20 MG tablet Take 20 mg by mouth at bedtime.    ?? cetirizine (ZYRTEC) 10 MG tablet Take 10 mg by mo

## 2021-12-21 NOTE — Assessment & Plan Note (Signed)
#  RIGHT BREAST STAGE I ER/PR POSITIVE; Her2 NEGATIVE.s/p Mastectomy [Dr.Cintron]; pmT1c sentinel lymph node negative.  Grade 2.  Patient poor candidate for chemotherapy-would not check Oncotype.  Clinically low risk.  Discussed no role for any adjuvant radiation therapy since s/p mastectomy. ? ?# Continue Aromasin [poor tolerance to anastrozole]-  ? ?# Hot flashes- G-1-2- sec to Aromasin ? ?# Seroma post mastectomy- [Dr.Cintron]  ? ?#Osteopenia : Worse- bone density [June-- 2022- T-score of -2.0.; June 2020 [T score -1.3]-  ?On Vit D 1000/day. [not calcium-] for weight loss today; given upcoming surgery.  We will reschedule again in near future. ? ? DISPOSITION: ?# follow in 3 month-MD; no labs Dr.B  ?

## 2022-02-01 ENCOUNTER — Ambulatory Visit
Admission: RE | Admit: 2022-02-01 | Discharge: 2022-02-01 | Disposition: A | Discharge status: Home / Self Care | Payer: Medicare Other | Source: Ambulatory Visit | Attending: Radiation Oncology | Admitting: Radiation Oncology

## 2022-02-01 ENCOUNTER — Encounter: Payer: Self-pay | Admitting: Radiation Oncology

## 2022-02-01 VITALS — BP 136/73 | HR 64 | Temp 98.2°F | Resp 16 | Wt 238.1 lb

## 2022-02-01 DIAGNOSIS — Z9011 Acquired absence of right breast and nipple: Secondary | ICD-10-CM | POA: Insufficient documentation

## 2022-02-01 DIAGNOSIS — D0512 Intraductal carcinoma in situ of left breast: Secondary | ICD-10-CM | POA: Diagnosis present

## 2022-02-01 DIAGNOSIS — Z170 Estrogen receptor positive status [ER+]: Secondary | ICD-10-CM | POA: Insufficient documentation

## 2022-02-01 DIAGNOSIS — Z923 Personal history of irradiation: Secondary | ICD-10-CM | POA: Insufficient documentation

## 2022-02-01 NOTE — Progress Notes (Signed)
Radiation Oncology ?Follow up Note ? ?Name: Kelly Howell   ?Date:   02/01/2022 ?MRN:  [id number] ?DOB: 08-17-1946  ? ? ?This 76 y.o. female presents to the clinic today for 1 year follow-up status post whole breast radiation to her left breast for ER positive ductal carcinoma in situ. ? ?REFERRING PROVIDER: Maryland Pink, MD ? ?HPI: Patient is a 76 year old female now at 1 year having completed whole breast radiation to her left breast for ER positive ductal carcinoma in situ.  She has recently been diagnosed with.  A right-sided breast cancer.  This presented on tomogram with a spiculated masslike density in the right mid breast.  MRI scans of the bilateral breasts confirmed biopsy in this area.  Left breast showed postradiation changes and surgical changes without evidence of disease.  Her tumor in the right breast was multifocal she underwent a right mastectomy showing multifocal invasive mammary carcinoma as well as ductal carcinoma in situ and lobular carcinoma in situ.  3 sentinel lymph nodes were all negative for metastatic disease.  Tumor was overall grade 2 has been seen by medical oncology who will not offer systemic chemotherapy.  She specifically denies left-sided breast tenderness.  She has an open wound on the right mastectomy scar which is being followed by surgery. ? ?COMPLICATIONS OF TREATMENT: none ? ?FOLLOW UP COMPLIANCE: keeps appointments  ? ?PHYSICAL EXAM:  ?BP 136/73 (BP Location: Left Wrist, Patient Position: Sitting, Cuff Size: Small)   Pulse 64   Temp 98.2 ?F (36.8 ?C) (Tympanic)   Resp 16   Wt 238 lb 1.6 oz (108 kg)   BMI 40.87 kg/m?  ?Patient is status post mastectomy of the right breast.  Incision is still open she has had a draining seroma in this area.  This is being followed by surgery.  Left breast is free of dominant mass.  No axillary or supraclavicular adenopathy is appreciated.  Well-developed well-nourished patient in NAD. HEENT reveals PERLA, EOMI, discs not visualized.   Oral cavity is clear. No oral mucosal lesions are identified. Neck is clear without evidence of cervical or supraclavicular adenopathy. Lungs are clear to A&P. Cardiac examination is essentially unremarkable with regular rate and rhythm without murmur rub or thrill. Abdomen is benign with no organomegaly or masses noted. Motor sensory and DTR levels are equal and symmetric in the upper and lower extremities. Cranial nerves II through XII are grossly intact. Proprioception is intact. No peripheral adenopathy or edema is identified. No motor or sensory levels are noted. Crude visual fields are within normal range. ? ?RADIOLOGY RESULTS: MRI scans mammograms all reviewed compatible with above-stated findings ? ?PLAN: Present time patient has no evidence of disease from her left breast cancer previously treated 1 year prior.  She is now recovering from mastectomy of the right breast.  Otherwise pleased with her overall progress.  I have asked to see her back in 1 year for follow-up.  She continues follow-up with surgeon based on the open wound. ? ?I would like to take this opportunity to thank you for allowing me to participate in the care of your patient.. ?  ? Noreene Filbert, MD ? ?

## 2022-03-20 ENCOUNTER — Other Ambulatory Visit: Payer: Self-pay | Admitting: Internal Medicine

## 2022-03-22 ENCOUNTER — Ambulatory Visit: Payer: Medicare Other | Admitting: Internal Medicine

## 2022-03-22 ENCOUNTER — Inpatient Hospital Stay: Payer: Medicare Other | Attending: Internal Medicine | Admitting: Internal Medicine

## 2022-03-22 ENCOUNTER — Encounter: Payer: Self-pay | Admitting: Internal Medicine

## 2022-03-22 DIAGNOSIS — N951 Menopausal and female climacteric states: Secondary | ICD-10-CM | POA: Diagnosis not present

## 2022-03-22 DIAGNOSIS — C50811 Malignant neoplasm of overlapping sites of right female breast: Secondary | ICD-10-CM

## 2022-03-22 DIAGNOSIS — M8580 Other specified disorders of bone density and structure, unspecified site: Secondary | ICD-10-CM | POA: Diagnosis not present

## 2022-03-22 DIAGNOSIS — Z170 Estrogen receptor positive status [ER+]: Secondary | ICD-10-CM

## 2022-03-22 DIAGNOSIS — Z79811 Long term (current) use of aromatase inhibitors: Secondary | ICD-10-CM | POA: Diagnosis not present

## 2022-03-22 NOTE — Patient Instructions (Signed)
#  Rogain topical on scalp as directed.   # Supplementation of Vitamin C -as directed  #Fish oil supplements -omega-3 fatty acids; as directed.

## 2022-03-22 NOTE — Progress Notes (Signed)
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  MRI January 2023-shows small cluster [subcentimeter x3]   PATHOLOGIC STAGE CLASSIFICATION (pTNM, AJCC 8th Edition):  Modified Classification: Not applicable  pT Category: pT1c  T Suffix: (m) multiple primary synchronous tumors in a single organ  Regional Lymph Nodes Modifier: (sn) Sentinel nodes evaluated  pN Category: pN0  pM Category: Not applicable   Patient had severe intolerance to anastrozole [started on left DCIS].  I would recommend Aromasin.   # FEB 15th, 2023- START AROMASIN   ---------------  BREAST, LEFT; EXCISION:  - DUCTAL CARCINOMA IN SITU, INTERMEDIATE GRADE, WITH COMEDONECROSIS AND  ASSOCIATED CALCIFICATIONS.  - ASSOCIATED ATYPICAL LOBULAR HYPERPLASIA.  - TWO CLIPS AND TWO BIOPSY SITES IDENTIFIED.  - NO EVIDENCE OF INVASIVE CARCINOMA.  - SEE CANCER SUMMARY.   B. LYMPH NODE, LEFT AXILLARY SENTINEL; EXCISION:  - ONE LYMPH NODE, NEGATIVE FOR MALIGNANCY (0/1).   # AUG, 3rd 2022-anastrozole x6 weeks; STOP SEP 2022 [sec to [facility]]   # SURVIVORSHIP:   # GENETICS:   DIAGNOSIS:   STAGE:         ;  GOALS:  CURRENT/MOST  RECENT THERAPY :     Ductal carcinoma in situ (DCIS) of left breast  11/09/2020 Initial Diagnosis   Ductal carcinoma in situ (DCIS) of left breast   12/03/2020 Cancer Staging   Staging form: Breast, AJCC 8th Edition - Clinical: Stage 0 (cTis (DCIS), cN0, cM0, G2, ER+) - Signed by Cammie Sickle, MD on 12/03/2020 Stage prefix: Initial diagnosis   Carcinoma of overlapping sites of right breast in female, estrogen receptor positive ([place])  10/18/2021 Initial Diagnosis   Carcinoma of overlapping sites of right breast in female, estrogen receptor positive ([place])   11/23/2021 Cancer Staging   Staging form: Breast, AJCC 8th Edition - Pathologic: Stage IA (pT1c, pN0, cM0, G2, ER+, PR+, HER2-) - Signed by Cammie Sickle, MD on 11/23/2021 Histologic grading system: 3 grade system    HISTORY OF PRESENTING ILLNESS: Ambulating with wheelchair.  Alone.  Kelly Howell 76 y.o.  female stage I ER/PR positive; her 2 Neg breast cancer postmastectomy currently on adjuvant Aomasin is here for follow-up.  Complains of significant hair loss.  Also complains of scalp itch.   Otherwise denies any swelling of the legs.  Denies any joint pains.  No hot flashes.  Denies any worsening joint pains.  Complains of hot flashes.  Review of Systems  Constitutional:  Negative for chills, diaphoresis, fever and weight loss.  HENT:  Negative for nosebleeds and sore throat.   Eyes:  Negative for double vision.  Respiratory:  Negative for cough, hemoptysis, sputum production, shortness of breath and wheezing.   Cardiovascular:  Negative for chest pain, palpitations and orthopnea.  Gastrointestinal:  Negative for abdominal pain,  blood in stool, constipation, diarrhea, heartburn, melena, nausea and vomiting.  Genitourinary:  Negative for dysuria, frequency and urgency.  Musculoskeletal:  Positive for back pain and joint pain.  Skin:  Negative for itching.  Neurological:  Negative for dizziness, tingling, focal  weakness, weakness and headaches.  Endo/Heme/Allergies:  Does not bruise/bleed easily.  Psychiatric/Behavioral:  Negative for depression. The patient is not nervous/anxious and does not have insomnia.      MEDICAL HISTORY:  Past Medical History:  Diagnosis Date   Abnormal mammogram    Atypical hyperplasia of breast    Left   Breast cancer ([place]) 2000   left lumpectomy   Colon polyps    COVID    09/2021   Dumping syndrome    Dyspnea    Dysrhythmia    pvc   Hyperlipidemia    Hypertension    IBS (irritable bowel syndrome)    Intermittent constipation    Intermittent diarrhea    Left sided lacunar infarction ([place])    per pt 11/04/21 testing at [facility] thought to be inner ear problem   OA (osteoarthritis)    Obesity    Personal history of radiation therapy    Torus palatinus     SURGICAL HISTORY: Past Surgical History:  Procedure Laterality Date   BREAST BIOPSY Left 2000   Positive   BREAST BIOPSY Left 11/03/2020   affirm bx ,  posterior group coil marker, DCIS NUCLEAR GRADE 2 WITH COMEDONECROSIS AND ASSOCIATED CALCIFICATIONS  ATYPICAL LOBULAR HYPERPLASIA   BREAST BIOPSY Left 11/03/2020   affirm bx, anterior group x marker, DCIS NUCLEAR GRADE 2 WITH COMEDONECROSIS AND ASSOCIATED CALCIFICATIONS  ATYPICAL LOBULAR HYPERPLASIA   BREAST BIOPSY Right 10/04/2021   Stereo Bx, X-clip, path pending   BREAST LUMPECTOMY Left 2000   positive for breast ca   BREAST LUMPECTOMY Left 11/19/2020   NL with SN DCIS   CESAREAN SECTION     CHOLECYSTECTOMY     COLONOSCOPY     COLONOSCOPY WITH PROPOFOL N/A 07/04/2016   Procedure: COLONOSCOPY WITH PROPOFOL;  Surgeon: Lollie Sails, MD;  Location: [facility] ENDOSCOPY;  Service: Endoscopy;  Laterality: N/A;   COLONOSCOPY WITH PROPOFOL N/A 07/21/2021   Procedure: COLONOSCOPY WITH PROPOFOL;  Surgeon: Annamaria Helling, DO;  Location: [facility] ENDOSCOPY;  Service: Gastroenterology;  Laterality: N/A;   Left Wrist Biopsy     pt denies 11/04/21   PARTIAL  MASTECTOMY WITH NEEDLE LOCALIZATION AND AXILLARY SENTINEL LYMPH NODE BX Left 11/19/2020   Procedure: PARTIAL MASTECTOMY WITH NEEDLE LOCALIZATION AND AXILLARY SENTINEL LYMPH NODE BX;  Surgeon: Herbert Pun, MD;  Location: [facility] ORS;  Service: General;  Laterality: Left;   TOTAL MASTECTOMY Right 11/07/2021   Procedure: TOTAL MASTECTOMY WITH SENTINEL LYMPH NODE BIOPSY;  Surgeon: Herbert Pun, MD;  Location: [facility] ORS;  Service: General;  Laterality: Right;    SOCIAL HISTORY: Social History   Socioeconomic History   Marital status: Married    Spouse name: Not on file   Number of children: Not on file   Years of education: Not on file   Highest education level: Not on file  Occupational History   Not on file  Tobacco Use   Smoking status: Never   Smokeless tobacco: Never  Vaping Use   Vaping Use: Never used  Substance and Sexual Activity   Alcohol use: Never   Drug use: Never   Sexual activity: Not Currently  Other Topics Concern   Not on file  Social History Narrative   Lives in [place].  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    Social Determinants of Health   Financial Resource Strain: Not on file  Food Insecurity: Not on file  Transportation Needs: Not on file  Physical Activity: Not on file  Stress: Not on file  Social Connections: Not on file  Intimate Partner Violence: Not on file    FAMILY HISTORY: Family History  Problem Relation Age of Onset   Colon cancer Mother    Breast cancer Maternal Aunt 65   Breast cancer Paternal Grandmother 6    ALLERGIES:  is allergic to gantrisin [sulfisoxazole], sulfa antibiotics, and tape.  MEDICATIONS:  Current Outpatient Medications  Medication Sig Dispense Refill   acetaminophen (TYLENOL) 500 MG tablet Take 1,000 mg by mouth every 6 (six) hours as needed for moderate pain.     aspirin 81 MG chewable tablet Chew 81 mg by  mouth daily.     atorvastatin (LIPITOR) 20 MG tablet Take 20 mg by mouth at bedtime.     cetirizine (ZYRTEC) 10 MG tablet Take 10 mg by mouth daily as needed for allergies.     Coenzyme Q10 100 MG capsule Take 100 mg by mouth daily.     cyanocobalamin (,VITAMIN B-12,) 1000 MCG/ML injection Inject 1,000 mcg into the muscle every 30 (thirty) days.     exemestane (AROMASIN) 25 MG tablet TAKE ONE TABLET BY MOUTH ONE TIME DAILY AFTER BREAKFAST 30 tablet 0   fluticasone (FLONASE) 50 MCG/ACT nasal spray Place 2 sprays into both nostrils at bedtime as needed for rhinitis.     furosemide (LASIX) 20 MG tablet Take 20 mg by mouth daily.     gabapentin (NEURONTIN) 300 MG capsule Take 1 capsule (300 mg total) by mouth 2 (two) times daily for 5 days. 10 capsule 0   HYDROcodone-acetaminophen (NORCO/VICODIN) 5-325 MG tablet Take 1 tablet by mouth every 4 (four) hours as needed for moderate pain. 30 tablet 0   lisinopril (PRINIVIL,ZESTRIL) 20 MG tablet Take 20 mg by mouth daily.     metoprolol tartrate (LOPRESSOR) 25 MG tablet Take 25 mg by mouth 2 (two) times daily.     naproxen (NAPROSYN) 500 MG tablet Take 500 mg by mouth 2 (two) times daily with a meal.     nystatin cream (MYCOSTATIN) Apply 1 application topically daily as needed (Yeast).     Vitamin D3 (VITAMIN D) 25 MCG tablet Take 1,000 Units by mouth daily.     No current facility-administered medications for this visit.      Marland Kitchen  PHYSICAL EXAMINATION: ECOG PERFORMANCE STATUS: 0 - Asymptomatic  Vitals:   03/22/22 1053  BP: 129/75  Pulse: 62  Temp: 98.1 F (36.7 C)  SpO2: 99%   Filed Weights   03/22/22 1053  Weight: 238 lb (108 kg)    Physical Exam HENT:     Head: Normocephalic and atraumatic.     Mouth/Throat:     Pharynx: No oropharyngeal exudate.  Eyes:     Pupils: Pupils are equal, round, and reactive to light.  Cardiovascular:     Rate and Rhythm: Normal rate and regular rhythm.  Pulmonary:     Effort: Pulmonary effort is  normal. No respiratory distress.     Breath sounds: Normal breath sounds. No wheezing.  Abdominal:     General: Bowel sounds are normal. There is no distension.     Palpations: Abdomen is soft. There  is no mass.     Tenderness: There is no abdominal tenderness. There is no guarding or rebound.  Musculoskeletal:        General: No tenderness. Normal range of motion.     Cervical back: Normal range of motion and neck supple.  Skin:    General: Skin is warm.  Neurological:     Mental Status: She is alert and oriented to person, place, and time.  Psychiatric:        Mood and Affect: Affect normal.      LABORATORY DATA:  I have reviewed the data as listed Lab Results  Component Value Date   WBC 5.7 10/18/2021   HGB 12.9 10/18/2021   HCT 37.8 10/18/2021   MCV 97.7 10/18/2021   PLT 244 10/18/2021   Recent Labs    10/18/21 1302  NA 137  K 3.9  CL 108  CO2 23  GLUCOSE 118*  BUN 29*  CREATININE 1.00  CALCIUM 8.7*  GFRNONAA 59*  PROT 6.5  ALBUMIN 3.6  AST 19  ALT 17  ALKPHOS 75  BILITOT 0.7    RADIOGRAPHIC STUDIES: I have personally reviewed the radiological images as listed and agreed with the findings in the report. No results found.  ASSESSMENT & PLAN:   Carcinoma of overlapping sites of right breast in female, estrogen receptor positive ([place]) #  RIGHT BREAST STAGE I ER/PR POSITIVE; Her2 NEGATIVE.s/p Mastectomy [Dr.Cintron]; pmT1c sentinel lymph node negative.  Grade 2.  Patient poor candidate for chemotherapy-would not check Oncotype.  Clinically low risk.     # Continue Aromasin [poor tolerance to anastrozole]-tolerating with mild to moderate side effects see below  # hair loss/Itchy scalp: Secondary to Aromasin.  Recommend treatment with topical minoxidil  and supplementation of Vitamin C and omega-3 fatty acids.   # Hot flashes- G-1-2- sec to Aromasin- STABLE  # Seroma post mastectomy- [Dr.Cintron] -still draining-discussed with Dr. Peyton Najjar; currently  agrees to reevaluate the patient.  Also informed Maureen-for any OT options.  #Osteopenia : Worse- bone density [June-- 2022- T-score of -2.0.; June 2020 [T score -1.3]-  On Vit D 1000/day. [not calcium-]. Discussed the potential risk factors for osteoporosis- age/gender/postmenopausal status/use of anti-estrogen treatments.  Recommend  Reclast/ Discussed the potential benefits and/side effects  Including but not limited to Osteonecrosis of jaw/ hypocalcemia.  Patient is interested.  Ordered for next visit.    DISPOSITION: # follow in 3 month-MD;cbc; cmp; possible reclast-  Dr.B   All questions were answered. The patient/family knows to call the clinic with any problems, questions or concerns.   Cammie Sickle, MD 03/22/2022 11:40 AM

## 2022-03-22 NOTE — Assessment & Plan Note (Addendum)
#  RIGHT BREAST STAGE I ER/PR POSITIVE; Her2 NEGATIVE.s/p Mastectomy [Dr.Cintron]; pmT1c sentinel lymph node negative.  Grade 2.  Patient poor candidate for chemotherapy-would not check Oncotype.  Clinically low risk.     # Continue Aromasin [poor tolerance to anastrozole]-tolerating with mild to moderate side effects see below  # hair loss/Itchy scalp: Secondary to Aromasin.  Recommend treatment with topical minoxidil  and supplementation of Vitamin C and omega-3 fatty acids.   # Hot flashes- G-1-2- sec to Aromasin- STABLE  # Seroma post mastectomy- [Dr.Cintron] -still draining-discussed with Dr. Peyton Najjar; currently agrees to reevaluate the patient.  Also informed Maureen-for any OT options.  #Osteopenia : Worse- bone density [June-- 2022- T-score of -2.0.; June 2020 [T score -1.3]-  On Vit D 1000/day. [not calcium-]. Discussed the potential risk factors for osteoporosis- age/gender/postmenopausal status/use of anti-estrogen treatments.  Recommend  Reclast/ Discussed the potential benefits and/side effects  Including but not limited to Osteonecrosis of jaw/ hypocalcemia.  Patient is interested.  Ordered for next visit.    DISPOSITION: # follow in 3 month-MD;cbc; cmp; possible reclast-  Dr.B

## 2022-03-22 NOTE — Progress Notes (Signed)
C/o hair falling out and getting thin. Scalp is hot and very itchy x2 months.  Not sleeping well.  Seroma is still open and draining.

## 2022-04-19 ENCOUNTER — Other Ambulatory Visit: Payer: Self-pay | Admitting: Internal Medicine

## 2022-04-20 ENCOUNTER — Other Ambulatory Visit: Payer: Self-pay | Admitting: Internal Medicine

## 2022-04-21 ENCOUNTER — Encounter: Payer: Self-pay | Admitting: Internal Medicine

## 2022-06-22 ENCOUNTER — Inpatient Hospital Stay: Payer: Medicare Other

## 2022-06-22 ENCOUNTER — Encounter: Payer: Self-pay | Admitting: Internal Medicine

## 2022-06-22 ENCOUNTER — Inpatient Hospital Stay (HOSPITAL_BASED_OUTPATIENT_CLINIC_OR_DEPARTMENT_OTHER): Payer: Medicare Other | Admitting: Internal Medicine

## 2022-06-22 ENCOUNTER — Inpatient Hospital Stay: Payer: Medicare Other | Attending: Internal Medicine

## 2022-06-22 DIAGNOSIS — Z923 Personal history of irradiation: Secondary | ICD-10-CM | POA: Insufficient documentation

## 2022-06-22 DIAGNOSIS — C50811 Malignant neoplasm of overlapping sites of right female breast: Secondary | ICD-10-CM | POA: Diagnosis present

## 2022-06-22 DIAGNOSIS — Z79899 Other long term (current) drug therapy: Secondary | ICD-10-CM | POA: Diagnosis not present

## 2022-06-22 DIAGNOSIS — D0512 Intraductal carcinoma in situ of left breast: Secondary | ICD-10-CM | POA: Insufficient documentation

## 2022-06-22 DIAGNOSIS — Z170 Estrogen receptor positive status [ER+]: Secondary | ICD-10-CM | POA: Insufficient documentation

## 2022-06-22 DIAGNOSIS — M8580 Other specified disorders of bone density and structure, unspecified site: Secondary | ICD-10-CM | POA: Insufficient documentation

## 2022-06-22 DIAGNOSIS — N6320 Unspecified lump in the left breast, unspecified quadrant: Secondary | ICD-10-CM | POA: Insufficient documentation

## 2022-06-22 LAB — COMPREHENSIVE METABOLIC PANEL
ALT: 14 U/L (ref 0–44)
AST: 19 U/L (ref 15–41)
Albumin: 3.3 g/dL — ABNORMAL LOW (ref 3.5–5.0)
Alkaline Phosphatase: 76 U/L (ref 38–126)
Anion gap: 3 — ABNORMAL LOW (ref 5–15)
BUN: 22 mg/dL (ref 8–23)
CO2: 27 mmol/L (ref 22–32)
Calcium: 8.7 mg/dL — ABNORMAL LOW (ref 8.9–10.3)
Chloride: 110 mmol/L (ref 98–111)
Creatinine, Ser: 0.91 mg/dL (ref 0.44–1.00)
GFR, Estimated: >60 mL/min (ref >60)
Glucose, Bld: 111 mg/dL — ABNORMAL HIGH (ref 70–99)
Potassium: 4.5 mmol/L (ref 3.5–5.1)
Sodium: 140 mmol/L (ref 135–145)
Total Bilirubin: 0.9 mg/dL (ref 0.3–1.2)
Total Protein: 6.2 g/dL — ABNORMAL LOW (ref 6.5–8.1)

## 2022-06-22 LAB — CBC WITH DIFFERENTIAL/PLATELET
Abs Immature Granulocytes: 0.01 10*3/uL (ref 0.00–0.07)
Basophils Absolute: 0 10*3/uL (ref 0.0–0.1)
Basophils Relative: 1 %
Eosinophils Absolute: 0.1 10*3/uL (ref 0.0–0.5)
Eosinophils Relative: 3 %
HCT: 37.6 % (ref 36.0–46.0)
Hemoglobin: 12.5 g/dL (ref 12.0–15.0)
Immature Granulocytes: 0 %
Lymphocytes Relative: 16 %
Lymphs Abs: 0.7 10*3/uL (ref 0.7–4.0)
MCH: 32.2 pg (ref 26.0–34.0)
MCHC: 33.2 g/dL (ref 30.0–36.0)
MCV: 96.9 fL (ref 80.0–100.0)
Monocytes Absolute: 0.4 10*3/uL (ref 0.1–1.0)
Monocytes Relative: 9 %
Neutro Abs: 3 10*3/uL (ref 1.7–7.7)
Neutrophils Relative %: 71 %
Platelets: 209 10*3/uL (ref 150–400)
RBC: 3.88 MIL/uL (ref 3.87–5.11)
RDW: 12.8 % (ref 11.5–15.5)
WBC: 4.3 10*3/uL (ref 4.0–10.5)
nRBC: 0 % (ref 0.0–0.2)

## 2022-06-22 NOTE — Progress Notes (Signed)
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  MRI January 2023-shows small cluster [subcentimeter x3]   PATHOLOGIC STAGE CLASSIFICATION (pTNM, AJCC 8th Edition):  Modified Classification: Not applicable  pT Category: pT1c  T Suffix: (m) multiple primary synchronous tumors in a single organ  Regional Lymph Nodes Modifier: (sn) Sentinel nodes evaluated  pN Category: pN0  pM Category: Not applicable   Patient had severe intolerance to anastrozole [started on left DCIS].  I would recommend Aromasin.   # FEB 15th, 2023- START AROMASIN   ---------------  BREAST, LEFT; EXCISION:  - DUCTAL CARCINOMA IN SITU, INTERMEDIATE GRADE, WITH COMEDONECROSIS AND  ASSOCIATED CALCIFICATIONS.  - ASSOCIATED ATYPICAL LOBULAR HYPERPLASIA.  - TWO CLIPS AND TWO BIOPSY SITES IDENTIFIED.  - NO EVIDENCE OF INVASIVE CARCINOMA.  - SEE CANCER SUMMARY.   B. LYMPH NODE, LEFT AXILLARY SENTINEL; EXCISION:  - ONE LYMPH NODE, NEGATIVE FOR MALIGNANCY (0/1).   # AUG, 3rd 2022-anastrozole x6 weeks; STOP SEP 2022 [sec to [facility]]   # SURVIVORSHIP:   # GENETICS:   DIAGNOSIS:   STAGE:         ;  GOALS:  CURRENT/MOST  RECENT THERAPY :     Ductal carcinoma in situ (DCIS) of left breast  11/09/2020 Initial Diagnosis   Ductal carcinoma in situ (DCIS) of left breast   12/03/2020 Cancer Staging   Staging form: Breast, AJCC 8th Edition - Clinical: Stage 0 (cTis (DCIS), cN0, cM0, G2, ER+) - Signed by Cammie Sickle, MD on 12/03/2020 Stage prefix: Initial diagnosis   Carcinoma of overlapping sites of right breast in female, estrogen receptor positive ([place])  10/18/2021 Initial Diagnosis   Carcinoma of overlapping sites of right breast in female, estrogen receptor positive ([place])   11/23/2021 Cancer Staging   Staging form: Breast, AJCC 8th Edition - Pathologic: Stage IA (pT1c, pN0, cM0, G2, ER+, PR+, HER2-) - Signed by Cammie Sickle, MD on 11/23/2021 Histologic grading system: 3 grade system    HISTORY OF PRESENTING ILLNESS: Ambulating with wheelchair.  Alone.  Thea Gist 76 y.o.  female stage I ER/PR positive; her 2 Neg breast cancer postmastectomy; left breast DCIS s/p lumpectomy currently on adjuvant Aomasin is here for follow-up.   Complains of fullness under the left clavicle.  Also complains of swelling along the left breast and mild tenderness at the lumpectomy site on the left.    Otherwise denies any swelling of the legs.  Denies any joint pains.  No hot flashes.  Denies any worsening joint pains.  Complains of hot flashes.  Continues to be compliant with her Aromasin.  Review of Systems  Constitutional:  Negative for chills, diaphoresis, fever and weight loss.  HENT:  Negative for nosebleeds and sore throat.   Eyes:  Negative for double vision.  Respiratory:  Negative for cough, hemoptysis, sputum production, shortness of breath and wheezing.   Cardiovascular:  Negative for chest pain, palpitations and orthopnea.  Gastrointestinal:  Negative for abdominal pain, blood in stool, constipation, diarrhea, heartburn, melena, nausea and vomiting.  Genitourinary:  Negative for dysuria,  frequency and urgency.  Musculoskeletal:  Positive for back pain and joint pain.  Skin:  Negative for itching.  Neurological:  Negative for dizziness, tingling, focal weakness, weakness and headaches.  Endo/Heme/Allergies:  Does not bruise/bleed easily.  Psychiatric/Behavioral:  Negative for depression. The patient is not nervous/anxious and does not have insomnia.      MEDICAL HISTORY:  Past Medical History:  Diagnosis Date  . Abnormal mammogram   . Atypical hyperplasia of breast    Left  . Breast cancer ([place]) 2000   left lumpectomy  . Colon polyps   . COVID    09/2021  . Dumping syndrome   . Dyspnea   . Dysrhythmia    pvc  . Hyperlipidemia   . Hypertension   . IBS (irritable bowel syndrome)   . Intermittent constipation   . Intermittent diarrhea   . Left sided lacunar infarction [employer])    per pt 11/04/21 testing at [facility] thought to be inner ear problem  . OA (osteoarthritis)   . Obesity   . Personal history of radiation therapy   . Torus palatinus     SURGICAL HISTORY: Past Surgical History:  Procedure Laterality Date  . BREAST BIOPSY Left 2000   Positive  . BREAST BIOPSY Left 11/03/2020   affirm bx ,  posterior group coil marker, DCIS NUCLEAR GRADE 2 WITH COMEDONECROSIS AND ASSOCIATED CALCIFICATIONS  ATYPICAL LOBULAR HYPERPLASIA  . BREAST BIOPSY Left 11/03/2020   affirm bx, anterior group x marker, DCIS NUCLEAR GRADE 2 WITH COMEDONECROSIS AND ASSOCIATED CALCIFICATIONS  ATYPICAL LOBULAR HYPERPLASIA  . BREAST BIOPSY Right 10/04/2021   Stereo Bx, X-clip, path pending  . BREAST LUMPECTOMY Left 2000   positive for breast ca  . BREAST LUMPECTOMY Left 11/19/2020   NL with SN DCIS  . CESAREAN SECTION    . CHOLECYSTECTOMY    . COLONOSCOPY    . COLONOSCOPY WITH PROPOFOL N/A 07/04/2016   Procedure: COLONOSCOPY WITH PROPOFOL;  Surgeon: Lollie Sails, MD;  Location: [facility] ENDOSCOPY;  Service: Endoscopy;  Laterality: N/A;  . COLONOSCOPY WITH PROPOFOL N/A 07/21/2021    Procedure: COLONOSCOPY WITH PROPOFOL;  Surgeon: Annamaria Helling, DO;  Location: [facility] ENDOSCOPY;  Service: Gastroenterology;  Laterality: N/A;  . Left Wrist Biopsy     pt denies 11/04/21  . PARTIAL MASTECTOMY WITH NEEDLE LOCALIZATION AND AXILLARY SENTINEL LYMPH NODE BX Left 11/19/2020   Procedure: PARTIAL MASTECTOMY WITH NEEDLE LOCALIZATION AND AXILLARY SENTINEL LYMPH NODE BX;  Surgeon: Herbert Pun, MD;  Location: [facility] ORS;  Service: General;  Laterality: Left;  . TOTAL MASTECTOMY Right 11/07/2021   Procedure: TOTAL MASTECTOMY WITH SENTINEL LYMPH NODE BIOPSY;  Surgeon: Herbert Pun, MD;  Location: [facility] ORS;  Service: General;  Laterality: Right;    SOCIAL HISTORY: Social History   Socioeconomic History  . Marital status: Married    Spouse name: Not on file  . Number of children: Not on file  . Years of education: Not on file  . Highest education level: Not on file  Occupational History  . Not on file  Tobacco Use  . Smoking status: Never  . Smokeless tobacco: Never  Vaping Use  . Vaping Use: Never used  Substance and Sexual Activity  . Alcohol use: Never  .  Drug use: Never  . Sexual activity: Not Currently  Other Topics Concern  . Not on file  Social History Narrative   Lives in [place]. 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    Social Determinants of Health   Financial Resource Strain: Not on file  Food Insecurity: Not on file  Transportation Needs: Not on file  Physical Activity: Not on file  Stress: Not on file  Social Connections: Not on file  Intimate Partner Violence: Not on file    FAMILY HISTORY: Family History  Problem Relation Age of Onset  . Colon cancer Mother   . Breast cancer Maternal Aunt 65  . Breast cancer Paternal Grandmother 37    ALLERGIES:  is allergic to gantrisin [sulfisoxazole], sulfa antibiotics, and tape.  MEDICATIONS:   Current Outpatient Medications  Medication Sig Dispense Refill  . acetaminophen (TYLENOL) 500 MG tablet Take 1,000 mg by mouth every 6 (six) hours as needed for moderate pain.    Marland Kitchen aspirin 81 MG chewable tablet Chew 81 mg by mouth daily.    Marland Kitchen atorvastatin (LIPITOR) 20 MG tablet Take 20 mg by mouth at bedtime.    . cetirizine (ZYRTEC) 10 MG tablet Take 10 mg by mouth daily as needed for allergies.    . Coenzyme Q10 100 MG capsule Take 100 mg by mouth daily.    . cyanocobalamin (,VITAMIN B-12,) 1000 MCG/ML injection Inject 1,000 mcg into the muscle every 30 (thirty) days.    Marland Kitchen exemestane (AROMASIN) 25 MG tablet TAKE ONE TABLET BY MOUTH ONE TIME DAILY AFTER BREAKFAST 30 tablet 2  . fluticasone (FLONASE) 50 MCG/ACT nasal spray Place 2 sprays into both nostrils at bedtime as needed for rhinitis.    . furosemide (LASIX) 20 MG tablet Take 20 mg by mouth daily.    Marland Kitchen HYDROcodone-acetaminophen (NORCO/VICODIN) 5-325 MG tablet Take 1 tablet by mouth every 4 (four) hours as needed for moderate pain. 30 tablet 0  . lisinopril (PRINIVIL,ZESTRIL) 20 MG tablet Take 20 mg by mouth daily.    . metoprolol tartrate (LOPRESSOR) 25 MG tablet Take 25 mg by mouth 2 (two) times daily.    . naproxen (NAPROSYN) 500 MG tablet Take 500 mg by mouth 2 (two) times daily with a meal.    . nystatin cream (MYCOSTATIN) Apply 1 application topically daily as needed (Yeast).    . pantoprazole (PROTONIX) 40 MG tablet Take 40 mg by mouth daily.    . Vitamin D3 (VITAMIN D) 25 MCG tablet Take 1,000 Units by mouth daily.    Marland Kitchen gabapentin (NEURONTIN) 300 MG capsule Take 1 capsule (300 mg total) by mouth 2 (two) times daily for 5 days. 10 capsule 0   No current facility-administered medications for this visit.      Right and left BREAST exam [in the presence of nurse]-right mastectomy.  Left side no unusual skin changes or dominant masses felt. Surgical scars noted.  Generalized mild swelling noted on the left breast/chest  wall.  PHYSICAL EXAMINATION: ECOG PERFORMANCE STATUS: 0 - Asymptomatic  Vitals:   06/22/22 0926  BP: 132/69  Pulse: (!) 59  Temp: (!) 97.5 F (36.4 C)  SpO2: 99%   Filed Weights   06/22/22 0926  Weight: 241 lb 6.4 oz (109.5 kg)    Physical Exam HENT:     Head: Normocephalic and atraumatic.     Mouth/Throat:     Pharynx: No  oropharyngeal exudate.  Eyes:     Pupils: Pupils are equal, round, and reactive to light.  Cardiovascular:     Rate and Rhythm: Normal rate and regular rhythm.  Pulmonary:     Effort: Pulmonary effort is normal. No respiratory distress.     Breath sounds: Normal breath sounds. No wheezing.  Abdominal:     General: Bowel sounds are normal. There is no distension.     Palpations: Abdomen is soft. There is no mass.     Tenderness: There is no abdominal tenderness. There is no guarding or rebound.  Musculoskeletal:        General: No tenderness. Normal range of motion.     Cervical back: Normal range of motion and neck supple.  Skin:    General: Skin is warm.  Neurological:     Mental Status: She is alert and oriented to person, place, and time.  Psychiatric:        Mood and Affect: Affect normal.     LABORATORY DATA:  I have reviewed the data as listed Lab Results  Component Value Date   WBC 4.3 06/22/2022   HGB 12.5 06/22/2022   HCT 37.6 06/22/2022   MCV 96.9 06/22/2022   PLT 209 06/22/2022   Recent Labs    10/18/21 1302 06/22/22 0917  NA 137 140  K 3.9 4.5  CL 108 110  CO2 23 27  GLUCOSE 118* 111*  BUN 29* 22  CREATININE 1.00 0.91  CALCIUM 8.7* 8.7*  GFRNONAA 59* >60  PROT 6.5 6.2*  ALBUMIN 3.6 3.3*  AST 19 19  ALT 17 14  ALKPHOS 75 76  BILITOT 0.7 0.9    RADIOGRAPHIC STUDIES: I have personally reviewed the radiological images as listed and agreed with the findings in the report. No results found.  ASSESSMENT & PLAN:   Carcinoma of overlapping sites of right breast in female, estrogen receptor positive ([place]) #   2022-RIGHT BREAST STAGE I ER/PR POSITIVE; Her2 NEGATIVE.s/p Mastectomy [Dr.Cintron]; pmT1c sentinel lymph node negative.  Grade 2.  Patient poor candidate for chemotherapy-would not check Oncotype.  Clinically low risk.    # Left breast DCIS [2022]-status post lumpectomy radiation.  # Continue Aromasin [poor tolerance to anastrozole]-tolerating with mild to moderate side effects see below  # Hot flashes- G-1-2- sec to Aromasin- STABLE  # Seroma post mastectomy- [Dr.Cintron] -still draining-discussed with Dr. Peyton Najjar; currently agrees to reevaluate the patient.    #Osteopenia : Worse- bone density [June-- 2022- T-score of -2.0.; June 2020 [T score -1.3]-  On Vit D 1000/day. [not calcium-constipation]. Discussed the potential risk factors for osteoporosis- age/gender/postmenopausal status/use of anti-estrogen treatments.  Recommend  Reclast/ Discussed the potential benefits and/side effects  Including but not limited to Osteonecrosis of jaw/ hypocalcemia.  Patient is interested; however- ca today- 8.7; recommend increase the vit D to 2000 units; 1-2 tums a day.    # Left sided chest wall swelling- ? Lymphedema- recommend evaluation with Maureen.    DISPOSITION: # referal to [facility] re: left sided lymphedema. # HOLD reclast- # follow in 4 month-MD;cbc; cmp; possible reclast-  Dr.B   All questions were answered. The patient/family knows to call the clinic with any problems, questions or concerns.   Cammie Sickle, MD 06/22/2022 10:11 AM

## 2022-06-22 NOTE — Assessment & Plan Note (Addendum)
#  2022-RIGHT BREAST STAGE I ER/PR POSITIVE; Her2 NEGATIVE.s/p Mastectomy [Dr.Cintron]; pmT1c sentinel lymph node negative.  Grade 2.  Patient poor candidate for chemotherapy-would not check Oncotype.  Clinically low risk.   #  [2022] -Left breast DCIS-status post lumpectomy radiation.  # Continue Aromasin [poor tolerance to anastrozole]-tolerating with mild to moderate side effects see below  # Hot flashes- G-1-2- sec to Aromasin- STABLE  # Seroma post mastectomy-right breast-finally resolved.  Monitor closely.  #Osteopenia : Worse- bone density [June-- 2022- T-score of -2.0.; June 2020 [T score -1.3]-  On Vit D 1000/day. [not calcium-constipation]. Discussed the potential risk factors for osteoporosis- age/gender/postmenopausal status/use of anti-estrogen treatments.  Recommend  Reclast/ Discussed the potential benefits and/side effects  Including but not limited to Osteonecrosis of jaw/ hypocalcemia.  Patient is interested; however- ca today- 8.7; holding Reclast.  Recommend increase the vit D to 2000 units; 1-2 tums a day.    # Left sided chest wall swelling- ? Lymphedema- recommend evaluation with Maureen.  Referral made.   DISPOSITION: # referal to [facility] re: left sided lymphedema. # HOLD reclast- # follow in 4 month-MD;cbc; cmp; possible reclast-  Dr.B

## 2022-06-22 NOTE — Progress Notes (Unsigned)
Would like to have you look at an area of skin on her lt clavicle area, feels bigger.  C/o tenderness on her left side from lumpectomy.

## 2022-06-23 ENCOUNTER — Encounter: Payer: Self-pay | Admitting: Internal Medicine

## 2022-07-05 ENCOUNTER — Inpatient Hospital Stay: Payer: Medicare Other | Admitting: Occupational Therapy

## 2022-07-05 DIAGNOSIS — L905 Scar conditions and fibrosis of skin: Secondary | ICD-10-CM

## 2022-07-05 NOTE — Therapy (Signed)
[facility] at [place], [place], [place], [address] Phone: [phone]   Fax:  [phone]  Occupational Therapy Screen:  Patient Details  Name: Kelly Howell MRN: [id number] Date of Birth: 06-02-1946 No data recorded  Encounter Date: 07/05/2022   OT End of Session - 07/05/22 1334     Visit Number 0             Past Medical History:  Diagnosis Date   Abnormal mammogram    Atypical hyperplasia of breast    Left   Breast cancer ([place]) 2000   left lumpectomy   Colon polyps    COVID    09/2021   Dumping syndrome    Dyspnea    Dysrhythmia    pvc   Hyperlipidemia    Hypertension    IBS (irritable bowel syndrome)    Intermittent constipation    Intermittent diarrhea    Left sided lacunar infarction ([place])    per pt 11/04/21 testing at [facility] thought to be inner ear problem   OA (osteoarthritis)    Obesity    Personal history of radiation therapy    Torus palatinus     Past Surgical History:  Procedure Laterality Date   BREAST BIOPSY Left 2000   Positive   BREAST BIOPSY Left 11/03/2020   affirm bx ,  posterior group coil marker, DCIS NUCLEAR GRADE 2 WITH COMEDONECROSIS AND ASSOCIATED CALCIFICATIONS  ATYPICAL LOBULAR HYPERPLASIA   BREAST BIOPSY Left 11/03/2020   affirm bx, anterior group x marker, DCIS NUCLEAR GRADE 2 WITH COMEDONECROSIS AND ASSOCIATED CALCIFICATIONS  ATYPICAL LOBULAR HYPERPLASIA   BREAST BIOPSY Right 10/04/2021   Stereo Bx, X-clip, path pending   BREAST LUMPECTOMY Left 2000   positive for breast ca   BREAST LUMPECTOMY Left 11/19/2020   NL with SN DCIS   CESAREAN SECTION     CHOLECYSTECTOMY     COLONOSCOPY     COLONOSCOPY WITH PROPOFOL N/A 07/04/2016   Procedure: COLONOSCOPY WITH PROPOFOL;  Surgeon: Lollie Sails, MD;  Location[facility] ENDOSCOPY;  Service: Endoscopy;  Laterality: N/A;   COLONOSCOPY WITH PROPOFOL N/A 07/21/2021   Procedure: COLONOSCOPY WITH PROPOFOL;  Surgeon: Annamaria Helling, DO;  Location[facility] ENDOSCOPY;  Service: Gastroenterology;  Laterality: N/A;   Left Wrist Biopsy     pt denies 11/04/21   PARTIAL MASTECTOMY WITH NEEDLE LOCALIZATION AND AXILLARY SENTINEL LYMPH NODE BX Left 11/19/2020   Procedure: PARTIAL MASTECTOMY WITH NEEDLE LOCALIZATION AND AXILLARY SENTINEL LYMPH NODE BX;  Surgeon: Herbert Pun, MD;  Location: [facility] ORS;  Service: General;  Laterality: Left;   TOTAL MASTECTOMY Right 11/07/2021   Procedure: TOTAL MASTECTOMY WITH SENTINEL LYMPH NODE BIOPSY;  Surgeon: Herbert Pun, MD;  Location: [facility] ORS;  Service: General;  Laterality: Right;    There were no vitals filed for this visit.   Subjective Assessment - 07/05/22 1332     Subjective  I have this knot on my clavical - Dr B thought maybe lymphedema. Then my L breast tender and hard at side, some times increase in heaviness and fuller . The R side Seroma took while to heal -but that pockets sometimes are more swollen by the end of day    Currently in Pain? Yes    Pain Score 5     Pain Location Breast    Pain Orientation Left    Pain Descriptors / Indicators Tender;Tightness    Pain Type Surgical pain    Pain Onset More  than a month ago                 LYMPHEDEMA/ONCOLOGY QUESTIONNAIRE - 07/05/22 0001       Right Upper Extremity Lymphedema   15 cm Proximal to Olecranon Process 45.5 cm    10 cm Proximal to Olecranon Process 45 cm    Olecranon Process 30 cm    15 cm Proximal to Ulnar Styloid Process 27.5 cm      Left Upper Extremity Lymphedema   15 cm Proximal to Olecranon Process 45.5 cm    10 cm Proximal to Olecranon Process 46 cm    Olecranon Process 29.5 cm    15 cm Proximal to Ulnar Styloid Process 28 cm             DR B 06/22/22 NOTE ASSESSMENT & PLAN:    Carcinoma of overlapping sites of right breast in female, estrogen receptor positive ([place]) #  2022-RIGHT BREAST STAGE I ER/PR POSITIVE; Her2 NEGATIVE.s/p Mastectomy [Dr.Cintron];  pmT1c sentinel lymph node negative.  Grade 2.  Patient poor candidate for chemotherapy-would not check Oncotype.  Clinically low risk.   #  [2022] -Left breast DCIS-status post lumpectomy radiation.   # Continue Aromasin [poor tolerance to anastrozole]-tolerating with mild to moderate side effects see below   # Hot flashes- G-1-2- sec to Aromasin- STABLE   # Seroma post mastectomy-right breast-finally resolved.  Monitor closely.   #Osteopenia : Worse- bone density [June-- 2022- T-score of -2.0.; June 2020 [T score -1.3]-  On Vit D 1000/day. [not calcium-constipation]. Discussed the potential risk factors for osteoporosis- age/gender/postmenopausal status/use of anti-estrogen treatments.  Recommend  Reclast/ Discussed the potential benefits and/side effects  Including but not limited to Osteonecrosis of jaw/ hypocalcemia.  Patient is interested; however- ca today- 8.7; holding Reclast.  Recommend increase the vit D to 2000 units; 1-2 tums a day.     # Left sided chest wall swelling- ? Lymphedema- recommend evaluation with Izumi Mixon.  Referral made.    DISPOSITION: # referal to [facility] re: left sided lymphedema. # HOLD reclast- # follow in 4 month-MD;cbc; cmp; possible reclast-  Dr.B       OT SCREEN 07/05/22: Pt refer to OT for lymphedema L side.  She and L breast CA with lumpectomy and radiation 11/19/20. On R she had mastectomy with seroma 10/18/21 on the R . Upon assessment pt report feeling knot on L clavicle area. Ed pt that  do not appear like lymphedema. At risk for  lymphedema on thoracic - below clavicle to navel. Pt do have area of scar tissue and fibrosis on lateral L breast  from 2 o'olock to 5 o'oclock- tenderness 5/10 . Pt also with increase swelling at times in L breast end of day.  Done soft tissue massage using mini massager and responded great and had no pain end of session. Will make her chip bag to use for fibrosis  under bra -  cont scar and soft tissue massage 2 x day for 2 min  . Pt appear to also have increase swelling under R arm in axilla end of day - can benefit from jovipak breast pad to decrease edema /lymphedema in thoracic R more than L . She had 2 sons that has down syndrome -that she does most cooking, laundry - help one of them with ADL's.  She do use walker most of the time. Pt to follow up with me in 3 wks to reassess.  Visit Diagnosis: Scar tissue    Problem List Patient Active Problem List   Diagnosis Date Noted   Breast cancer ([place]) 11/07/2021   Carcinoma of overlapping sites of right breast in female, estrogen receptor positive ([place]) 10/18/2021   Osteoporosis, post-menopausal 07/19/2021   Ductal carcinoma in situ (DCIS) of left breast 11/09/2020   BMI 40.0-44.9, adult ([place]) 11/25/2019   Sciatica of right side 09/10/2014   Spondylolisthesis of lumbar region 09/10/2014   Primary osteoarthritis of both knees 07/14/2014    Rosalyn Gess, OTR/L,CLT 07/05/2022, 1:35 PM  [facility] [facility] [address] [place], [place], [address] Phone: [phone]   Fax:  [phone]  Name: LEIGHA OLBERDING MRN: [id number] Date of Birth: Nov 04, 1945

## 2022-07-18 ENCOUNTER — Other Ambulatory Visit: Payer: Self-pay | Admitting: Internal Medicine

## 2022-07-18 NOTE — Telephone Encounter (Signed)
Next visit 10/24/22 Last visit 06/22/22:  Continue Aromasin [poor tolerance to anastrozole]-tolerating with mild to moderate side effects see below # Hot flashes- G-1-2- sec to Aromasin- STABLE

## 2022-07-19 ENCOUNTER — Inpatient Hospital Stay: Payer: Medicare Other | Attending: Internal Medicine | Admitting: Occupational Therapy

## 2022-07-19 DIAGNOSIS — L905 Scar conditions and fibrosis of skin: Secondary | ICD-10-CM

## 2022-07-19 NOTE — Therapy (Signed)
[facility] [place], [place], [place], [address] Phone: [phone]   Fax:  [phone]  Occupational Therapy Screen:  Patient Details  Name: Kelly Howell MRN: [id number] Date of Birth: August 27, 1946 No data recorded  Encounter Date: 07/19/2022   OT End of Session - 07/19/22 1325     Visit Number 0             Past Medical History:  Diagnosis Date   Abnormal mammogram    Atypical hyperplasia of breast    Left   Breast cancer ([place]) 2000   left lumpectomy   Colon polyps    COVID    09/2021   Dumping syndrome    Dyspnea    Dysrhythmia    pvc   Hyperlipidemia    Hypertension    IBS (irritable bowel syndrome)    Intermittent constipation    Intermittent diarrhea    Left sided lacunar infarction ([place])    per pt 11/04/21 testing at [facility] thought to be inner ear problem   OA (osteoarthritis)    Obesity    Personal history of radiation therapy    Torus palatinus     Past Surgical History:  Procedure Laterality Date   BREAST BIOPSY Left 2000   Positive   BREAST BIOPSY Left 11/03/2020   affirm bx ,  posterior group coil marker, DCIS NUCLEAR GRADE 2 WITH COMEDONECROSIS AND ASSOCIATED CALCIFICATIONS  ATYPICAL LOBULAR HYPERPLASIA   BREAST BIOPSY Left 11/03/2020   affirm bx, anterior group x marker, DCIS NUCLEAR GRADE 2 WITH COMEDONECROSIS AND ASSOCIATED CALCIFICATIONS  ATYPICAL LOBULAR HYPERPLASIA   BREAST BIOPSY Right 10/04/2021   Stereo Bx, X-clip, path pending   BREAST LUMPECTOMY Left 2000   positive for breast ca   BREAST LUMPECTOMY Left 11/19/2020   NL with SN DCIS   CESAREAN SECTION     CHOLECYSTECTOMY     COLONOSCOPY     COLONOSCOPY WITH PROPOFOL N/A 07/04/2016   Procedure: COLONOSCOPY WITH PROPOFOL;  Surgeon: Lollie Sails, MD;  Location: [facility] ENDOSCOPY;  Service: Endoscopy;  Laterality: N/A;   COLONOSCOPY WITH PROPOFOL N/A 07/21/2021   Procedure: COLONOSCOPY WITH PROPOFOL;  Surgeon:  Annamaria Helling, DO;  Location: [facility] ENDOSCOPY;  Service: Gastroenterology;  Laterality: N/A;   Left Wrist Biopsy     pt denies 11/04/21   PARTIAL MASTECTOMY WITH NEEDLE LOCALIZATION AND AXILLARY SENTINEL LYMPH NODE BX Left 11/19/2020   Procedure: PARTIAL MASTECTOMY WITH NEEDLE LOCALIZATION AND AXILLARY SENTINEL LYMPH NODE BX;  Surgeon: Herbert Pun, MD;  Location: [facility] ORS;  Service: General;  Laterality: Left;   TOTAL MASTECTOMY Right 11/07/2021   Procedure: TOTAL MASTECTOMY WITH SENTINEL LYMPH NODE BIOPSY;  Surgeon: Herbert Pun, MD;  Location: [facility] ORS;  Service: General;  Laterality: Right;    There were no vitals filed for this visit.   Subjective Assessment - 07/19/22 1323     Subjective  I seen Dr. Peyton Najjar the day after you.  And he is going to do a biopsy.  My breast feels much better after you worked on at the time.  The pain is better the hardness is better and I got me the Jovi pack to use for the swelling on my right side.                  DR B 06/22/22 NOTE ASSESSMENT & PLAN:    Carcinoma of overlapping sites of right breast in female, estrogen receptor positive ([place]) #  2022-RIGHT BREAST STAGE I ER/PR POSITIVE; Her2 NEGATIVE.s/p Mastectomy [Dr.Cintron]; pmT1c sentinel lymph node negative.  Grade 2.  Patient poor candidate for chemotherapy-would not check Oncotype.  Clinically low risk.   #  [2022] -Left breast DCIS-status post lumpectomy radiation.   # Continue Aromasin [poor tolerance to anastrozole]-tolerating with mild to moderate side effects see below   # Hot flashes- G-1-2- sec to Aromasin- STABLE   # Seroma post mastectomy-right breast-finally resolved.  Monitor closely.   #Osteopenia : Worse- bone density [June-- 2022- T-score of -2.0.; June 2020 [T score -1.3]-  On Vit D 1000/day. [not calcium-constipation]. Discussed the potential risk factors for osteoporosis- age/gender/postmenopausal status/use of anti-estrogen treatments.   Recommend  Reclast/ Discussed the potential benefits and/side effects  Including but not limited to Osteonecrosis of jaw/ hypocalcemia.  Patient is interested; however- ca today- 8.7; holding Reclast.  Recommend increase the vit D to 2000 units; 1-2 tums a day.     # Left sided chest wall swelling- ? Lymphedema- recommend evaluation with Zhoe Catania.  Referral made.    DISPOSITION: # referal to [facility] re: left sided lymphedema. # HOLD reclast- # follow in 4 month-MD;cbc; cmp; possible reclast-  Dr.B          OT SCREEN 07/05/22: Pt refer to OT for lymphedema L side.  She and L breast CA with lumpectomy and radiation 11/19/20. On R she had mastectomy with seroma 10/18/21 on the R . Upon assessment pt report feeling knot on L clavicle area. Ed pt that  do not appear like lymphedema. At risk for  lymphedema on thoracic - below clavicle to navel. Pt do have area of scar tissue and fibrosis on lateral L breast  from 2 o'olock to 5 o'oclock- tenderness 5/10 . Pt also with increase swelling at times in L breast end of day.  Done soft tissue massage using mini massager and responded great and had no pain end of session. Will make her chip bag to use for fibrosis  under bra -  cont scar and soft tissue massage 2 x day for 2 min . Pt appear to also have increase swelling under R arm in axilla end of day - can benefit from jovipak breast pad to decrease edema /lymphedema in thoracic R more than L . She had 2 sons that has down syndrome -that she does most cooking, laundry - help one of them with ADL's.  She do use walker most of the time. Pt to follow up with me in 3 wks to reassess.         OT SCREEN 07/19/22:    Patient return for follow-up after being seen 2 weeks ago.  Patient reports she did see Dr. Peyton Najjar for the knot on the left clavicle area.  Plan is to have a biopsy.  She did get a Jovi pack to wear on the right side and feels it is helping for swelling or heaviness at the end of the day in axilla  and thoracic.  Educated patient that she can also use in the left breast as needed.  Patient had scar tissue and fibrosis on the lateral left breast last time from 2:00 to 5:00.  With pain 5/10.  Patient responded great on soft tissue massage and mini massager last time.  This time she only had a small area at 5:00.  Done some more soft tissue massage manually and mini massager with great response.  Pain decreases to a 1/10.  Patient to continue with soft tissue  massage at home as well as using chip bag fabricated by OT last time- wear as needed Patient can continue at home with home program can follow-up as needed.                                   Visit Diagnosis: Scar tissue    Problem List Patient Active Problem List   Diagnosis Date Noted   Breast cancer ([place]) 11/07/2021   Carcinoma of overlapping sites of right breast in female, estrogen receptor positive ([place]) 10/18/2021   Osteoporosis, post-menopausal 07/19/2021   Ductal carcinoma in situ (DCIS) of left breast 11/09/2020   BMI 40.0-44.9, adult ([place]) 11/25/2019   Sciatica of right side 09/10/2014   Spondylolisthesis of lumbar region 09/10/2014   Primary osteoarthritis of both knees 07/14/2014    Rosalyn Gess, OTR/L,CLT 07/19/2022, 1:25 PM  [facility] [address], [place], [place], [address] Phone: [phone]   Fax:  [phone]  Name: SHLOKA BALDRIDGE MRN: [id number] Date of Birth: 1945-11-13

## 2022-07-26 ENCOUNTER — Ambulatory Visit: Payer: Medicare Other | Admitting: Occupational Therapy

## 2022-08-03 ENCOUNTER — Other Ambulatory Visit: Payer: Self-pay | Admitting: General Surgery

## 2022-08-07 LAB — SURGICAL PATHOLOGY

## 2022-08-16 IMAGING — MG MM DIGITAL DIAGNOSTIC UNILAT*L* W/ TOMO W/ CAD
6 series · 6 of 10 positions shown · non-contrast
Comparison: Previous exam(s).

CLINICAL DATA: First six-month follow-up for probably benign
calcifications in the LEFT breast. Patient has a history of LEFT
Patient reports there was lack of consensus regarding the presence
or absence of malignancy in the specimen. Patient did not need
radiation or chemotherapy.

EXAM:
DIGITAL DIAGNOSTIC UNILATERAL LEFT MAMMOGRAM WITH TOMO AND CAD

[L LM (1 of 2)]
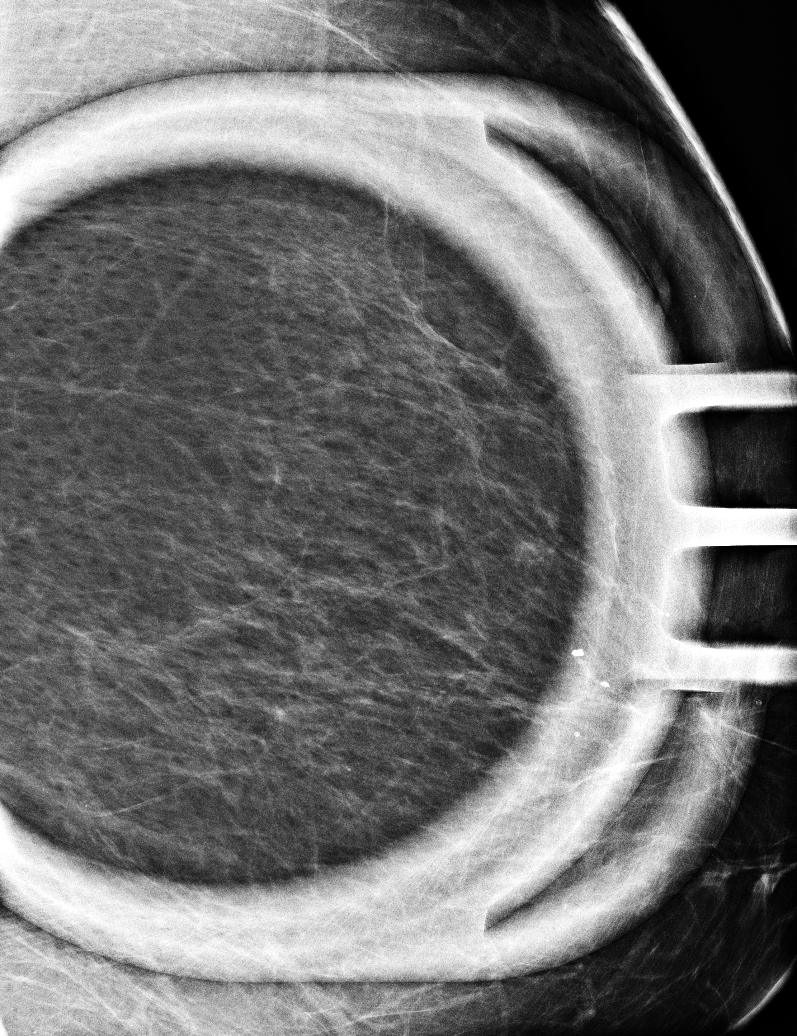

[L LM (2 of 2)]
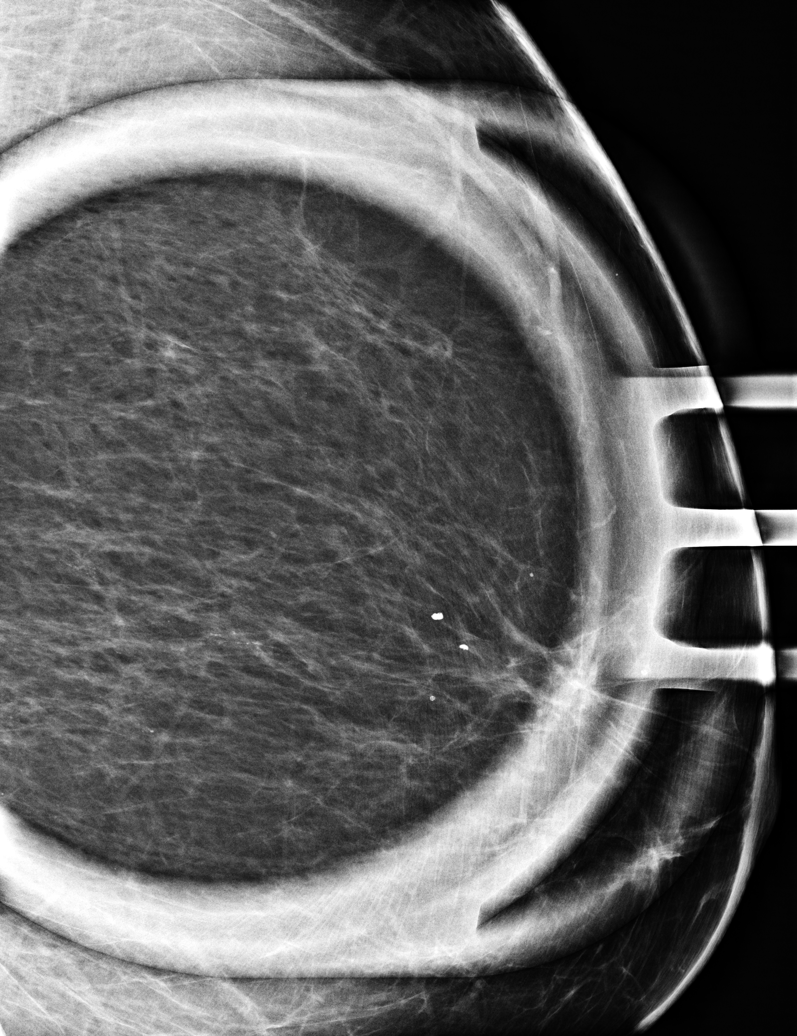

[L CC]
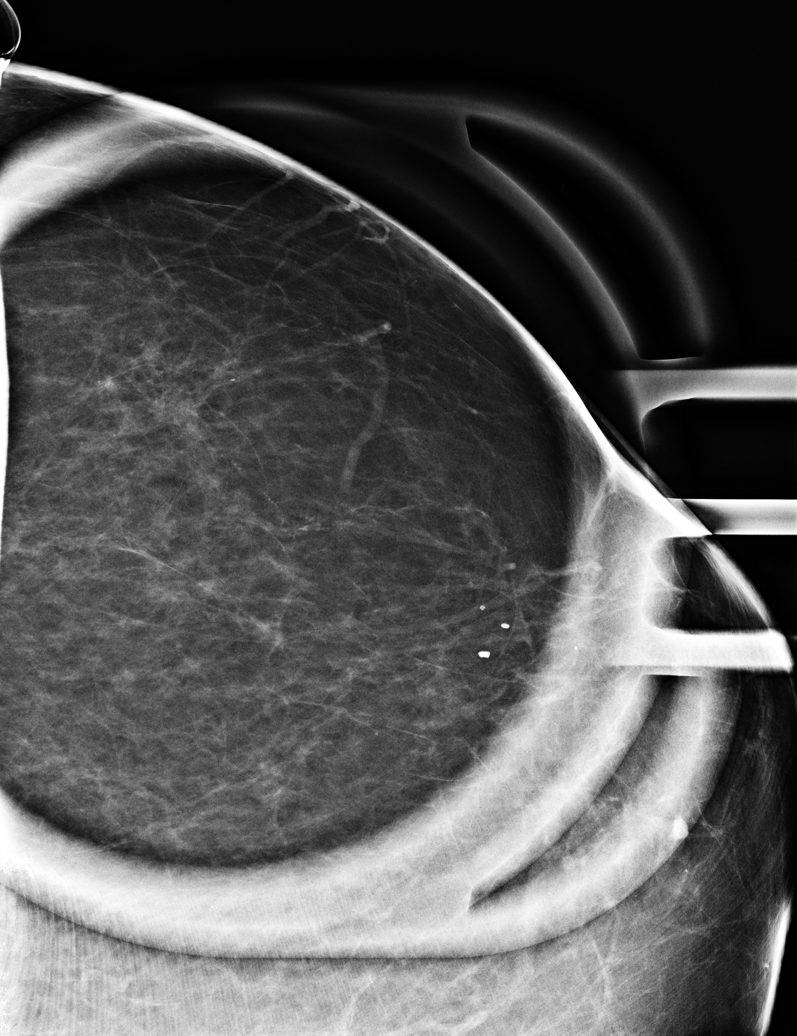

[L MLO synth-2D]
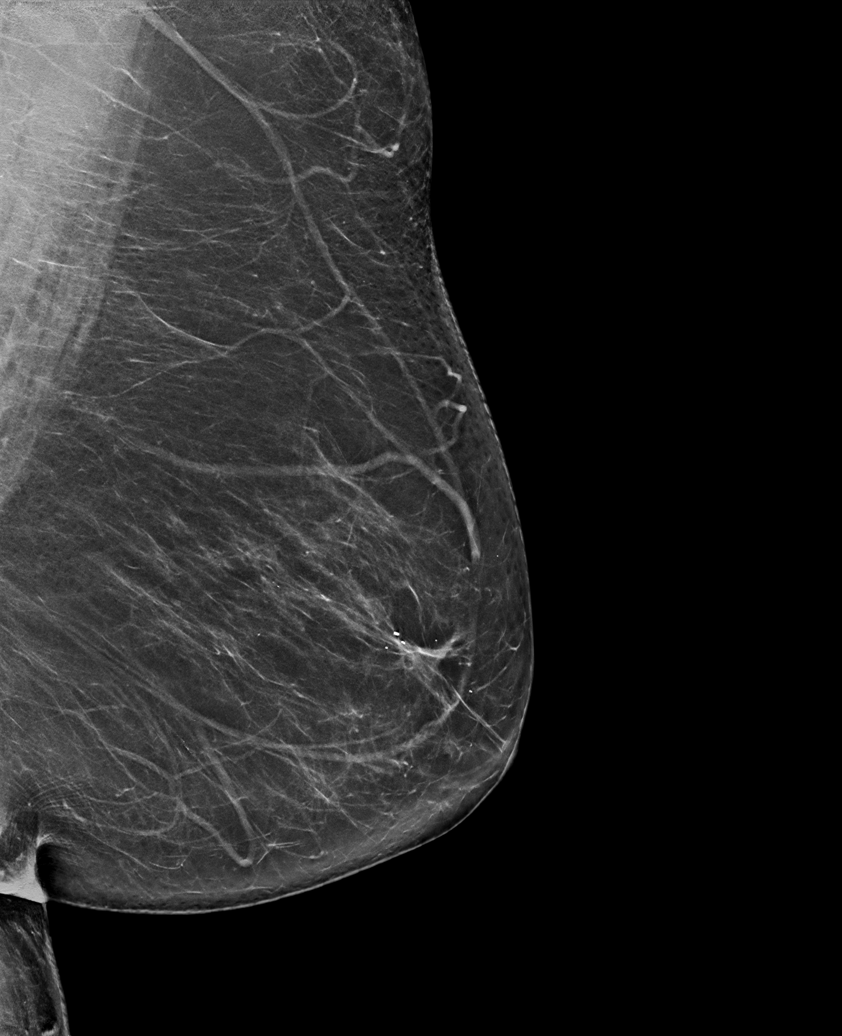

[L CC synth-2D]
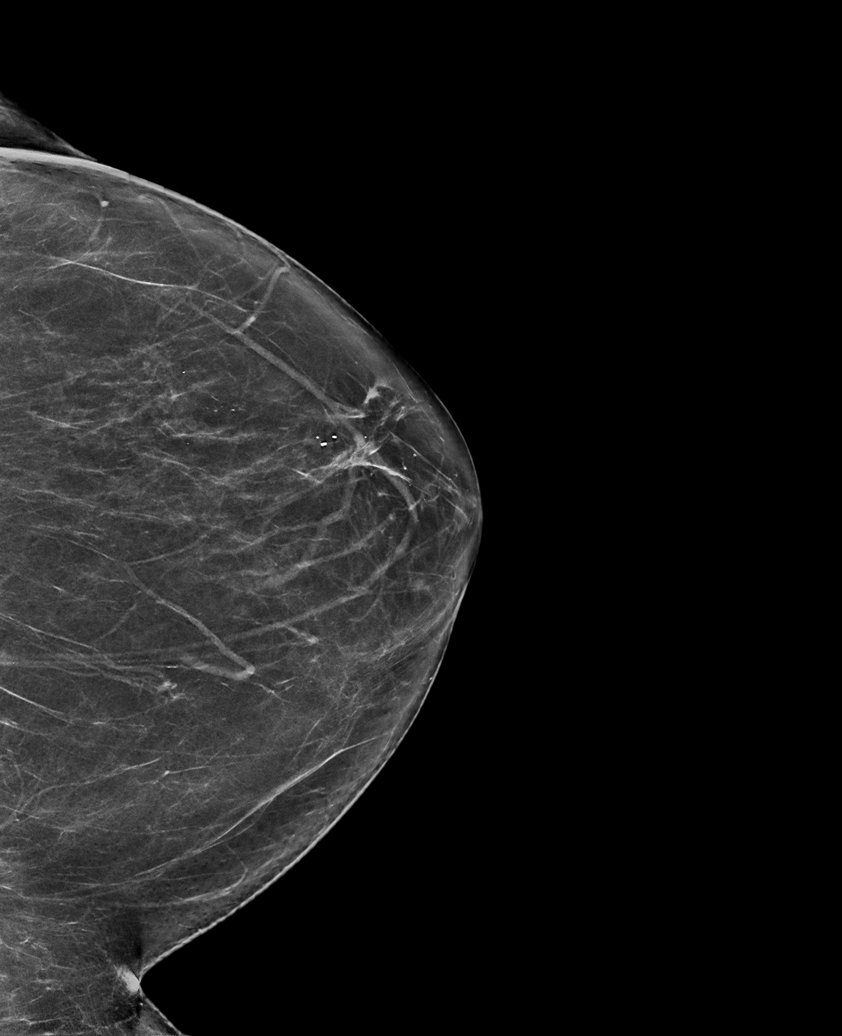

[L MLO tomo · tomo slice 41/80.0]
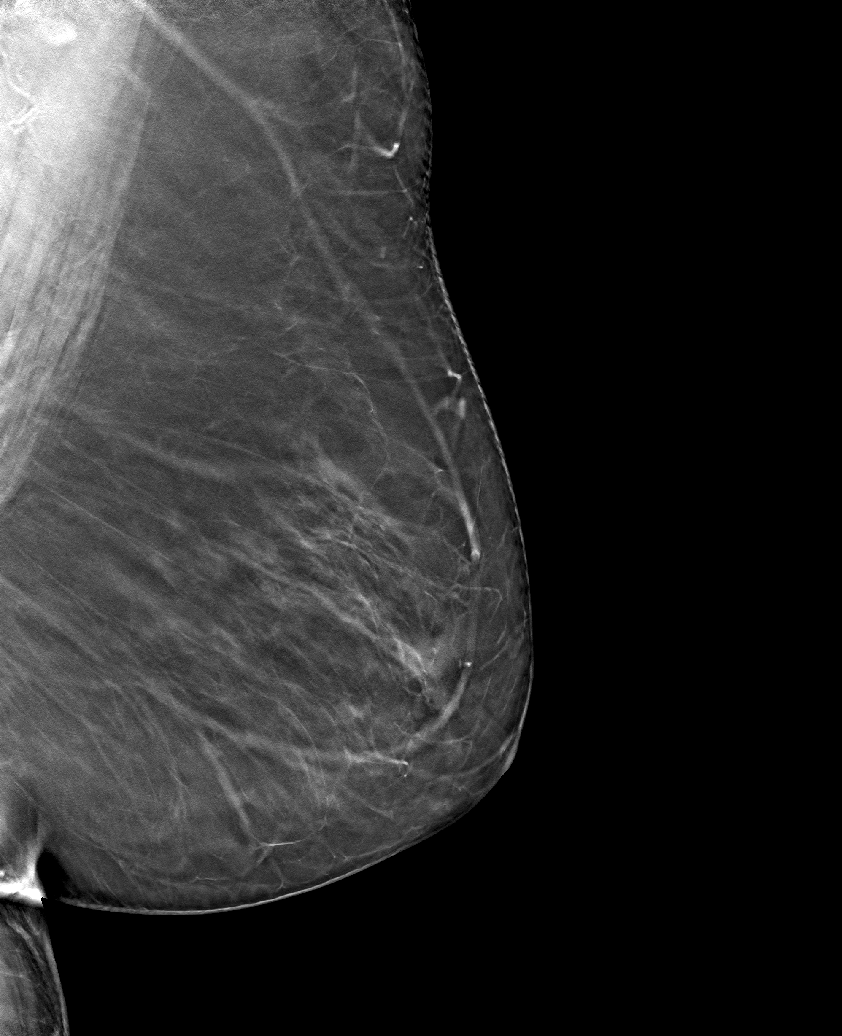

[6 of 10 positions shown; findings below may reference images not displayed]

ACR Breast Density Category b: There are scattered areas of
fibroglandular density.
FINDINGS: Magnified views are performed of calcifications in the UPPER-OUTER
QUADRANT of the LEFT breast. On magnified views there are faint
pleomorphic calcifications, some with linear distribution.
Calcifications span 3.2 x 1.5 centimeters and are best seen on the
craniocaudal projection.

Mammographic images were processed with CAD.
IMPRESSION: Indeterminate LEFT breast calcifications spanning 3.2 centimeters.
Calcifications are very faint. At the time of biopsy, if
calcifications are adequately visualized, consider targeting of
anterior and posterior calcifications. Otherwise, recommend
targeting the calcifications better best seen at the time of biopsy.

RECOMMENDATION:
Stereotactic guided core biopsy of 2 sites in the LEFT breast, as
above.

I have discussed the findings and recommendations with the patient.
If applicable, a reminder letter will be sent to the patient
regarding the next appointment.

BI-RADS CATEGORY  4: Suspicious.

## 2022-09-12 ENCOUNTER — Encounter: Payer: Self-pay | Admitting: General Surgery

## 2022-09-12 ENCOUNTER — Other Ambulatory Visit: Payer: Self-pay | Admitting: General Surgery

## 2022-09-12 DIAGNOSIS — Z1231 Encounter for screening mammogram for malignant neoplasm of breast: Secondary | ICD-10-CM

## 2022-10-20 ENCOUNTER — Other Ambulatory Visit: Payer: Self-pay | Admitting: Internal Medicine

## 2022-10-21 ENCOUNTER — Encounter: Payer: Self-pay | Admitting: Internal Medicine

## 2022-10-24 ENCOUNTER — Encounter: Payer: Self-pay | Admitting: Internal Medicine

## 2022-10-24 ENCOUNTER — Inpatient Hospital Stay: Payer: Medicare Other | Attending: Internal Medicine | Admitting: Internal Medicine

## 2022-10-24 ENCOUNTER — Inpatient Hospital Stay: Payer: Medicare Other

## 2022-10-24 VITALS — BP 157/60 | HR 65 | Temp 98.7°F | Resp 19 | Wt 234.7 lb

## 2022-10-24 DIAGNOSIS — C50811 Malignant neoplasm of overlapping sites of right female breast: Secondary | ICD-10-CM | POA: Insufficient documentation

## 2022-10-24 DIAGNOSIS — Z79811 Long term (current) use of aromatase inhibitors: Secondary | ICD-10-CM | POA: Insufficient documentation

## 2022-10-24 DIAGNOSIS — Z170 Estrogen receptor positive status [ER+]: Secondary | ICD-10-CM

## 2022-10-24 DIAGNOSIS — E559 Vitamin D deficiency, unspecified: Secondary | ICD-10-CM | POA: Diagnosis not present

## 2022-10-24 DIAGNOSIS — M8580 Other specified disorders of bone density and structure, unspecified site: Secondary | ICD-10-CM | POA: Insufficient documentation

## 2022-10-24 LAB — COMPREHENSIVE METABOLIC PANEL
ALT: 22 U/L (ref 0–44)
AST: 21 U/L (ref 15–41)
Albumin: 3.5 g/dL (ref 3.5–5.0)
Alkaline Phosphatase: 88 U/L (ref 38–126)
Anion gap: 7 (ref 5–15)
BUN: 20 mg/dL (ref 8–23)
CO2: 26 mmol/L (ref 22–32)
Calcium: 8.9 mg/dL (ref 8.9–10.3)
Chloride: 107 mmol/L (ref 98–111)
Creatinine, Ser: 0.88 mg/dL (ref 0.44–1.00)
GFR, Estimated: >60 mL/min (ref >60)
Glucose, Bld: 109 mg/dL — ABNORMAL HIGH (ref 70–99)
Potassium: 4.3 mmol/L (ref 3.5–5.1)
Sodium: 140 mmol/L (ref 135–145)
Total Bilirubin: 0.7 mg/dL (ref 0.3–1.2)
Total Protein: 6.6 g/dL (ref 6.5–8.1)

## 2022-10-24 LAB — CBC WITH DIFFERENTIAL/PLATELET
Abs Immature Granulocytes: 0.01 10*3/uL (ref 0.00–0.07)
Basophils Absolute: 0.1 10*3/uL (ref 0.0–0.1)
Basophils Relative: 1 %
Eosinophils Absolute: 0.2 10*3/uL (ref 0.0–0.5)
Eosinophils Relative: 3 %
HCT: 40.4 % (ref 36.0–46.0)
Hemoglobin: 13.3 g/dL (ref 12.0–15.0)
Immature Granulocytes: 0 %
Lymphocytes Relative: 16 %
Lymphs Abs: 0.7 10*3/uL (ref 0.7–4.0)
MCH: 31.7 pg (ref 26.0–34.0)
MCHC: 32.9 g/dL (ref 30.0–36.0)
MCV: 96.2 fL (ref 80.0–100.0)
Monocytes Absolute: 0.6 10*3/uL (ref 0.1–1.0)
Monocytes Relative: 12 %
Neutro Abs: 3.2 10*3/uL (ref 1.7–7.7)
Neutrophils Relative %: 68 %
Platelets: 213 10*3/uL (ref 150–400)
RBC: 4.2 MIL/uL (ref 3.87–5.11)
RDW: 12.7 % (ref 11.5–15.5)
WBC: 4.7 10*3/uL (ref 4.0–10.5)
nRBC: 0 % (ref 0.0–0.2)

## 2022-10-24 NOTE — Assessment & Plan Note (Addendum)
#  2022-RIGHT BREAST STAGE I ER/PR POSITIVE; Her2 NEGATIVE.s/p Mastectomy [Dr.Cintron]; pmT1c sentinel lymph node negative.  Grade 2.  No Oncotype.  Clinically low risk.  [2022] -Left breast DCIS-status post lumpectomy radiation.  # Continue Aromasin [poor tolerance to anastrozole]-tolerating with mild to moderate side effects see below. Awaiting mammo- JAN 2024.   # Hot flashes- G-1-2- sec to Aromasin- stable.   # Seroma post mastectomy-right breast-finally resolved.  Monitor closely. stable.   #Osteopenia : Worse- bone density [June-- 2022- T-score of -2.0.; June 2020 [T score -1.3];  [not calcium-constipation]. Discussed the potential risk factors for osteoporosis- age/gender/postmenopausal status/use of anti-estrogen treatments.  Recommend  Reclast/ Discussed the potential benefits and/side effects  Including but not limited to Osteonecrosis of jaw/ hypocalcemia.  Today calcium is 8.9- however recommend holding the cast while awaiting dental clearance prior to Reclast [appt in feb 2024]  continue the vit D to 2000 units; 1-2 tums a day.    # right & Left sided chest wall swelling-Lymphedema- s/p  evaluation with Maureen.  STABLE.   # reclast q 12 months [10/24/2022- not given; P-dental ]   DISPOSITION: # HOLD reclast- # follow in 6 month-MD;cbc; cmp; vit D- 25 OH levels;  possible reclast-  Dr.B

## 2022-10-24 NOTE — Patient Instructions (Signed)
#  Recommend dental clearance for recast infusion/zoledronic acid.

## 2022-10-24 NOTE — Progress Notes (Signed)
[facility] [place] NOTE  Patient Care Team: [place] Pink, MD as PCP - General (Family Medicine) Theodore Demark, RN (Inactive) as Oncology Nurse Navigator Cammie Sickle, MD as Consulting Physician (Internal Medicine) Herbert Pun, MD as Consulting Physician (General Surgery) Noreene Filbert, MD as Referring Physician (Radiation Oncology) Rico Junker, RN as Oncology Nurse Navigator  CHIEF COMPLAINTS/PURPOSE OF CONSULTATION: breast cancer  #  Oncology History Overview Note  # 2005- left breast lumpectomy [Dr.Craford; Dr.Chokis; II opinion [facility]- hyperplasia] Tam x5 years   # 2022-LEFT BREAST DCIS x2; #A] Bx-cannot rule out microinvasive disease; ER positive; Dr.Cintron  # DEC 2022- RIGHT BREAST [place] with lobular features; status post biopsy RIGHT BREAST Early stage  ER/PR POSITIVE; Her2 NEGATIVE.  MRI January 2023-shows small cluster [subcentimeter x3]   PATHOLOGIC STAGE CLASSIFICATION (pTNM, AJCC 8th Edition):  Modified Classification: Not applicable  pT Category: pT1c  T Suffix: (m) multiple primary synchronous tumors in a single organ  Regional Lymph Nodes Modifier: (sn) Sentinel nodes evaluated  pN Category: pN0  pM Category: Not applicable   Patient had severe intolerance to anastrozole [started on left DCIS].  I would recommend Aromasin.   # FEB 15th, 2023- START AROMASIN   ---------------  BREAST, LEFT; EXCISION:  - DUCTAL CARCINOMA IN SITU, INTERMEDIATE GRADE, WITH COMEDONECROSIS AND  ASSOCIATED CALCIFICATIONS.  - ASSOCIATED ATYPICAL LOBULAR HYPERPLASIA.  - TWO CLIPS AND TWO BIOPSY SITES IDENTIFIED.  - NO EVIDENCE OF INVASIVE CARCINOMA.  - SEE CANCER SUMMARY.   B. LYMPH NODE, LEFT AXILLARY SENTINEL; EXCISION:  - ONE LYMPH NODE, NEGATIVE FOR MALIGNANCY (0/1).   # AUG, 3rd 2022-anastrozole x6 weeks; STOP SEP 2022 [sec to [facility] AEs]   # SURVIVORSHIP:   # GENETICS:   DIAGNOSIS:   STAGE:         ;  GOALS:  CURRENT/MOST  RECENT THERAPY :     Ductal carcinoma in situ (DCIS) of left breast  11/09/2020 Initial Diagnosis   Ductal carcinoma in situ (DCIS) of left breast   12/03/2020 Cancer Staging   Staging form: Breast, AJCC 8th Edition - Clinical: Stage 0 (cTis (DCIS), cN0, cM0, G2, ER+) - Signed by Cammie Sickle, MD on 12/03/2020 Stage prefix: Initial diagnosis   Carcinoma of overlapping sites of right breast in female, estrogen receptor positive ([place])  10/18/2021 Initial Diagnosis   Carcinoma of overlapping sites of right breast in female, estrogen receptor positive ([place])   11/23/2021 Cancer Staging   Staging form: Breast, AJCC 8th Edition - Pathologic: Stage IA (pT1c, pN0, cM0, G2, ER+, PR+, HER2-) - Signed by Cammie Sickle, MD on 11/23/2021 Histologic grading system: 3 grade system    HISTORY OF PRESENTING ILLNESS: Ambulating with wheelchair.  Alone.  Kelly Howell 77 y.o.  female right breast cancer stage I ER/PR positive; her 2 Neg postmastectomy &  left breast DCIS s/p lumpectomy currently on adjuvant Aomasin is here for follow-up.  Patient has no concerns today.   Noted to have dental filling fell off.  Awaiting dental evaluation in February 2024.    Otherwise denies any swelling of the legs.  Chronic right knee pain.  Denies any worsening joint pains.  Complains of mild hot flashes.  Continues to be compliant with her Aromasin.  Review of Systems  Constitutional:  Negative for chills, diaphoresis, fever and weight loss.  HENT:  Negative for nosebleeds and sore throat.   Eyes:  Negative for double vision.  Respiratory:  Negative for cough, hemoptysis, sputum  production, shortness of breath and wheezing.   Cardiovascular:  Negative for chest pain, palpitations and orthopnea.  Gastrointestinal:  Negative for abdominal pain, blood in stool, constipation, diarrhea, heartburn, melena, nausea and vomiting.  Genitourinary:  Negative for dysuria, frequency and urgency.  Musculoskeletal:   Positive for back pain and joint pain.  Skin:  Negative for itching.  Neurological:  Negative for dizziness, tingling, focal weakness, weakness and headaches.  Endo/Heme/Allergies:  Does not bruise/bleed easily.  Psychiatric/Behavioral:  Negative for depression. The patient is not nervous/anxious and does not have insomnia.      MEDICAL HISTORY:  Past Medical History:  Diagnosis Date   Abnormal mammogram    Atypical hyperplasia of breast    Left   Breast cancer ([place]) 2000   left lumpectomy   Colon polyps    COVID    09/2021   Dumping syndrome    Dyspnea    Dysrhythmia    pvc   Hyperlipidemia    Hypertension    IBS (irritable bowel syndrome)    Intermittent constipation    Intermittent diarrhea    Left sided lacunar infarction ([place])    per pt 11/04/21 testing at [facility] thought to be inner ear problem   OA (osteoarthritis)    Obesity    Personal history of radiation therapy    Torus palatinus     SURGICAL HISTORY: Past Surgical History:  Procedure Laterality Date   BREAST BIOPSY Left 2000   Positive   BREAST BIOPSY Left 11/03/2020   affirm bx ,  posterior group coil marker, DCIS NUCLEAR GRADE 2 WITH COMEDONECROSIS AND ASSOCIATED CALCIFICATIONS  ATYPICAL LOBULAR HYPERPLASIA   BREAST BIOPSY Left 11/03/2020   affirm bx, anterior group x marker, DCIS NUCLEAR GRADE 2 WITH COMEDONECROSIS AND ASSOCIATED CALCIFICATIONS  ATYPICAL LOBULAR HYPERPLASIA   BREAST BIOPSY Right 10/04/2021   Stereo Bx, X-clip, path pending   BREAST LUMPECTOMY Left 2000   positive for breast ca   BREAST LUMPECTOMY Left 11/19/2020   NL with SN DCIS   CESAREAN SECTION     CHOLECYSTECTOMY     COLONOSCOPY     COLONOSCOPY WITH PROPOFOL N/A 07/04/2016   Procedure: COLONOSCOPY WITH PROPOFOL;  Surgeon: Lollie Sails, MD;  Location: [facility] ENDOSCOPY;  Service: Endoscopy;  Laterality: N/A;   COLONOSCOPY WITH PROPOFOL N/A 07/21/2021   Procedure: COLONOSCOPY WITH PROPOFOL;  Surgeon: Annamaria Helling, DO;   Location: [facility] ENDOSCOPY;  Service: Gastroenterology;  Laterality: N/A;   Left Wrist Biopsy     pt denies 11/04/21   PARTIAL MASTECTOMY WITH NEEDLE LOCALIZATION AND AXILLARY SENTINEL LYMPH NODE BX Left 11/19/2020   Procedure: PARTIAL MASTECTOMY WITH NEEDLE LOCALIZATION AND AXILLARY SENTINEL LYMPH NODE BX;  Surgeon: Herbert Pun, MD;  Location: [facility] ORS;  Service: General;  Laterality: Left;   TOTAL MASTECTOMY Right 11/07/2021   Procedure: TOTAL MASTECTOMY WITH SENTINEL LYMPH NODE BIOPSY;  Surgeon: Herbert Pun, MD;  Location: [facility] ORS;  Service: General;  Laterality: Right;    SOCIAL HISTORY: Social History   Socioeconomic History   Marital status: Married    Spouse name: Not on file   Number of children: Not on file   Years of education: Not on file   Highest education level: Not on file  Occupational History   Not on file  Tobacco Use   Smoking status: Never   Smokeless tobacco: Never  Vaping Use   Vaping Use: Never used  Substance and Sexual Activity   Alcohol use: Never   Drug use: Never  Sexual activity: Not Currently  Other Topics Concern   Not on file  Social History Narrative   Lives in [place]. 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    Social Determinants of Health   Financial Resource Strain: Not on file  Food Insecurity: Not on file  Transportation Needs: Not on file  Physical Activity: Not on file  Stress: Not on file  Social Connections: Not on file  Intimate Partner Violence: Not on file    FAMILY HISTORY: Family History  Problem Relation Age of Onset   Colon cancer Mother    Breast cancer Maternal Aunt 65   Breast cancer Paternal Grandmother 43    ALLERGIES:  is allergic to gantrisin [sulfisoxazole], sulfa antibiotics, and tape.  MEDICATIONS:  Current Outpatient Medications  Medication Sig Dispense Refill   acetaminophen (TYLENOL) 500 MG  tablet Take 1,000 mg by mouth every 6 (six) hours as needed for moderate pain.     aspirin 81 MG chewable tablet Chew 81 mg by mouth daily.     atorvastatin (LIPITOR) 20 MG tablet Take 20 mg by mouth at bedtime.     cetirizine (ZYRTEC) 10 MG tablet Take 10 mg by mouth daily as needed for allergies.     Coenzyme Q10 100 MG capsule Take 100 mg by mouth daily.     cyanocobalamin (,VITAMIN B-12,) 1000 MCG/ML injection Inject 1,000 mcg into the muscle every 30 (thirty) days.     exemestane (AROMASIN) 25 MG tablet TAKE ONE TABLET BY MOUTH ONE TIME DAILY AFTER BREAKFAST 30 tablet 2   fluticasone (FLONASE) 50 MCG/ACT nasal spray Place 2 sprays into both nostrils at bedtime as needed for rhinitis.     furosemide (LASIX) 20 MG tablet Take 20 mg by mouth daily.     gabapentin (NEURONTIN) 300 MG capsule Take 1 capsule (300 mg total) by mouth 2 (two) times daily for 5 days. 10 capsule 0   HYDROcodone-acetaminophen (NORCO/VICODIN) 5-325 MG tablet Take 1 tablet by mouth every 4 (four) hours as needed for moderate pain. 30 tablet 0   lisinopril (PRINIVIL,ZESTRIL) 20 MG tablet Take 20 mg by mouth daily.     metoprolol tartrate (LOPRESSOR) 25 MG tablet Take 25 mg by mouth 2 (two) times daily.     naproxen (NAPROSYN) 500 MG tablet Take 500 mg by mouth 2 (two) times daily with a meal.     nystatin cream (MYCOSTATIN) Apply 1 application topically daily as needed (Yeast).     pantoprazole (PROTONIX) 40 MG tablet Take 40 mg by mouth daily.     Vitamin D3 (VITAMIN D) 25 MCG tablet Take 1,000 Units by mouth daily.     No current facility-administered medications for this visit.      Right and left BREAST exam [in the presence of nurse]-right mastectomy.  Left side no unusual skin changes or dominant masses felt. Surgical scars noted.  Generalized mild swelling noted on the left breast/chest wall.  PHYSICAL EXAMINATION: ECOG PERFORMANCE STATUS: 0 - Asymptomatic  Vitals:   10/24/22 1055  BP: (!) 157/60  Pulse: 65   Resp: 19  Temp: 98.7 F (37.1 C)  SpO2: 97%   Filed Weights   10/24/22 1055  Weight: 234 lb 11.2 oz (106.5 kg)    Physical Exam HENT:     Head: Normocephalic and atraumatic.     Mouth/Throat:     Pharynx: No oropharyngeal exudate.  Eyes:     Pupils: Pupils are equal, round, and reactive to light.  Cardiovascular:     Rate and Rhythm: Normal rate and regular rhythm.  Pulmonary:     Effort: Pulmonary effort is normal. No respiratory distress.     Breath sounds: Normal breath sounds. No wheezing.  Abdominal:     General: Bowel sounds are normal. There is no distension.     Palpations: Abdomen is soft. There is no mass.     Tenderness: There is no abdominal tenderness. There is no guarding or rebound.  Musculoskeletal:        General: No tenderness. Normal range of motion.     Cervical back: Normal range of motion and neck supple.  Skin:    General: Skin is warm.  Neurological:     Mental Status: She is alert and oriented to person, place, and time.  Psychiatric:        Mood and Affect: Affect normal.      LABORATORY DATA:  I have reviewed the data as listed Lab Results  Component Value Date   WBC 4.7 10/24/2022   HGB 13.3 10/24/2022   HCT 40.4 10/24/2022   MCV 96.2 10/24/2022   PLT 213 10/24/2022   Recent Labs    06/22/22 0917 10/24/22 1033  NA 140 140  K 4.5 4.3  CL 110 107  CO2 27 26  GLUCOSE 111* 109*  BUN 22 20  CREATININE 0.91 0.88  CALCIUM 8.7* 8.9  GFRNONAA >60 >60  PROT 6.2* 6.6  ALBUMIN 3.3* 3.5  AST 19 21  ALT 14 22  ALKPHOS 76 88  BILITOT 0.9 0.7    RADIOGRAPHIC STUDIES: I have personally reviewed the radiological images as listed and agreed with the findings in the report. No results found.  ASSESSMENT & PLAN:   Carcinoma of overlapping sites of right breast in female, estrogen receptor positive ([place]) #  2022-RIGHT BREAST STAGE I ER/PR POSITIVE; Her2 NEGATIVE.s/p Mastectomy [Dr.Cintron]; pmT1c sentinel lymph node negative.  Grade  2.  No Oncotype.  Clinically low risk.  [2022] -Left breast DCIS-status post lumpectomy radiation.  # Continue Aromasin [poor tolerance to anastrozole]-tolerating with mild to moderate side effects see below. Awaiting mammo- JAN 2024.   # Hot flashes- G-1-2- sec to Aromasin- stable.   # Seroma post mastectomy-right breast-finally resolved.  Monitor closely. stable.   #Osteopenia : Worse- bone density [June-- 2022- T-score of -2.0.; June 2020 [T score -1.3];  [not calcium-constipation]. Discussed the potential risk factors for osteoporosis- age/gender/postmenopausal status/use of anti-estrogen treatments.  Recommend  Reclast/ Discussed the potential benefits and/side effects  Including but not limited to Osteonecrosis of jaw/ hypocalcemia.  Today calcium is 8.9- however recommend holding the cast while awaiting dental clearance prior to Reclast [appt in feb 2024]  continue the vit D to 2000 units; 1-2 tums a day.    # right & Left sided chest wall swelling-Lymphedema- s/p  evaluation with Maureen.  STABLE.   # reclast q 12 months [10/24/2022- not given; P-dental ]   DISPOSITION: # HOLD reclast- # follow in 6 month-MD;cbc; cmp; vit D- 25 OH levels;  possible reclast-  Dr.B   All questions were answered. The patient/family knows to call the clinic with any problems, questions or concerns.   Cammie Sickle, MD 10/24/2022 11:31 AM

## 2022-10-24 NOTE — Progress Notes (Signed)
Patient has no concerns today. 

## 2022-10-26 ENCOUNTER — Ambulatory Visit
Admission: RE | Admit: 2022-10-26 | Discharge: 2022-10-26 | Disposition: A | Discharge status: Home / Self Care | Payer: Medicare Other | Source: Ambulatory Visit | Attending: General Surgery | Admitting: General Surgery

## 2022-10-26 ENCOUNTER — Encounter: Payer: Self-pay | Admitting: Internal Medicine

## 2022-10-26 DIAGNOSIS — Z1231 Encounter for screening mammogram for malignant neoplasm of breast: Secondary | ICD-10-CM | POA: Diagnosis not present

## 2022-10-27 ENCOUNTER — Other Ambulatory Visit: Payer: Self-pay | Admitting: General Surgery

## 2022-10-27 DIAGNOSIS — R928 Other abnormal and inconclusive findings on diagnostic imaging of breast: Secondary | ICD-10-CM

## 2022-10-27 DIAGNOSIS — R921 Mammographic calcification found on diagnostic imaging of breast: Secondary | ICD-10-CM

## 2022-11-02 ENCOUNTER — Ambulatory Visit
Admission: RE | Admit: 2022-11-02 | Discharge: 2022-11-02 | Disposition: A | Discharge status: Home / Self Care | Payer: Medicare Other | Source: Ambulatory Visit | Attending: General Surgery | Admitting: General Surgery

## 2022-11-02 DIAGNOSIS — R921 Mammographic calcification found on diagnostic imaging of breast: Secondary | ICD-10-CM

## 2022-11-02 DIAGNOSIS — R928 Other abnormal and inconclusive findings on diagnostic imaging of breast: Secondary | ICD-10-CM | POA: Insufficient documentation

## 2022-12-15 ENCOUNTER — Other Ambulatory Visit: Payer: Self-pay | Admitting: Internal Medicine

## 2022-12-18 ENCOUNTER — Encounter: Payer: Self-pay | Admitting: Internal Medicine

## 2022-12-30 ENCOUNTER — Emergency Department: Payer: Medicare Other

## 2022-12-30 ENCOUNTER — Inpatient Hospital Stay
Admission: EM | Admit: 2022-12-30 | Discharge: 2023-01-01 | DRG: 291 | Disposition: A | Discharge status: Home / Self Care | Payer: Medicare Other | Attending: Internal Medicine | Admitting: Internal Medicine

## 2022-12-30 ENCOUNTER — Other Ambulatory Visit: Payer: Self-pay

## 2022-12-30 DIAGNOSIS — M270 Developmental disorders of jaws: Secondary | ICD-10-CM | POA: Diagnosis present

## 2022-12-30 DIAGNOSIS — Z86010 Personal history of colonic polyps: Secondary | ICD-10-CM

## 2022-12-30 DIAGNOSIS — I5033 Acute on chronic diastolic (congestive) heart failure: Secondary | ICD-10-CM | POA: Diagnosis present

## 2022-12-30 DIAGNOSIS — I700 Atherosclerosis of aorta: Secondary | ICD-10-CM | POA: Diagnosis present

## 2022-12-30 DIAGNOSIS — Z9013 Acquired absence of bilateral breasts and nipples: Secondary | ICD-10-CM

## 2022-12-30 DIAGNOSIS — Z9109 Other allergy status, other than to drugs and biological substances: Secondary | ICD-10-CM

## 2022-12-30 DIAGNOSIS — Z803 Family history of malignant neoplasm of breast: Secondary | ICD-10-CM

## 2022-12-30 DIAGNOSIS — K589 Irritable bowel syndrome without diarrhea: Secondary | ICD-10-CM | POA: Diagnosis present

## 2022-12-30 DIAGNOSIS — Z8616 Personal history of COVID-19: Secondary | ICD-10-CM

## 2022-12-30 DIAGNOSIS — Z791 Long term (current) use of non-steroidal anti-inflammatories (NSAID): Secondary | ICD-10-CM | POA: Diagnosis not present

## 2022-12-30 DIAGNOSIS — I110 Hypertensive heart disease with heart failure: Principal | ICD-10-CM | POA: Diagnosis present

## 2022-12-30 DIAGNOSIS — R0602 Shortness of breath: Principal | ICD-10-CM

## 2022-12-30 DIAGNOSIS — Z9049 Acquired absence of other specified parts of digestive tract: Secondary | ICD-10-CM | POA: Diagnosis not present

## 2022-12-30 DIAGNOSIS — Z882 Allergy status to sulfonamides status: Secondary | ICD-10-CM

## 2022-12-30 DIAGNOSIS — I639 Cerebral infarction, unspecified: Secondary | ICD-10-CM | POA: Diagnosis present

## 2022-12-30 DIAGNOSIS — Z1152 Encounter for screening for COVID-19: Secondary | ICD-10-CM

## 2022-12-30 DIAGNOSIS — Z800 Family history of malignant neoplasm of digestive organs: Secondary | ICD-10-CM | POA: Diagnosis not present

## 2022-12-30 DIAGNOSIS — Z923 Personal history of irradiation: Secondary | ICD-10-CM

## 2022-12-30 DIAGNOSIS — M1990 Unspecified osteoarthritis, unspecified site: Secondary | ICD-10-CM | POA: Diagnosis present

## 2022-12-30 DIAGNOSIS — C50919 Malignant neoplasm of unspecified site of unspecified female breast: Secondary | ICD-10-CM | POA: Diagnosis present

## 2022-12-30 DIAGNOSIS — Z853 Personal history of malignant neoplasm of breast: Secondary | ICD-10-CM | POA: Diagnosis not present

## 2022-12-30 DIAGNOSIS — E785 Hyperlipidemia, unspecified: Secondary | ICD-10-CM | POA: Diagnosis present

## 2022-12-30 DIAGNOSIS — Z79899 Other long term (current) drug therapy: Secondary | ICD-10-CM | POA: Diagnosis not present

## 2022-12-30 DIAGNOSIS — Z8673 Personal history of transient ischemic attack (TIA), and cerebral infarction without residual deficits: Secondary | ICD-10-CM | POA: Diagnosis not present

## 2022-12-30 DIAGNOSIS — M170 Bilateral primary osteoarthritis of knee: Secondary | ICD-10-CM | POA: Diagnosis present

## 2022-12-30 DIAGNOSIS — Z6841 Body Mass Index (BMI) 40.0 and over, adult: Secondary | ICD-10-CM

## 2022-12-30 DIAGNOSIS — Z7982 Long term (current) use of aspirin: Secondary | ICD-10-CM | POA: Diagnosis not present

## 2022-12-30 DIAGNOSIS — K911 Postgastric surgery syndromes: Secondary | ICD-10-CM | POA: Diagnosis present

## 2022-12-30 DIAGNOSIS — I10 Essential (primary) hypertension: Secondary | ICD-10-CM | POA: Diagnosis not present

## 2022-12-30 LAB — COMPREHENSIVE METABOLIC PANEL
ALT: 13 U/L (ref 0–44)
AST: 22 U/L (ref 15–41)
Albumin: 3.2 g/dL — ABNORMAL LOW (ref 3.5–5.0)
Alkaline Phosphatase: 121 U/L (ref 38–126)
Anion gap: 5 (ref 5–15)
BUN: 23 mg/dL (ref 8–23)
CO2: 22 mmol/L (ref 22–32)
Calcium: 9 mg/dL (ref 8.9–10.3)
Chloride: 112 mmol/L — ABNORMAL HIGH (ref 98–111)
Creatinine, Ser: 0.67 mg/dL (ref 0.44–1.00)
GFR, Estimated: >60 mL/min (ref >60)
Glucose, Bld: 126 mg/dL — ABNORMAL HIGH (ref 70–99)
Potassium: 4 mmol/L (ref 3.5–5.1)
Sodium: 139 mmol/L (ref 135–145)
Total Bilirubin: 1.2 mg/dL (ref 0.3–1.2)
Total Protein: 6.6 g/dL (ref 6.5–8.1)

## 2022-12-30 LAB — RESP PANEL BY RT-PCR (RSV, FLU A&B, COVID)  RVPGX2
Influenza A by PCR: NEGATIVE
Influenza B by PCR: NEGATIVE
Resp Syncytial Virus by PCR: NEGATIVE
SARS Coronavirus 2 by RT PCR: NEGATIVE

## 2022-12-30 LAB — CBC WITH DIFFERENTIAL/PLATELET
Abs Immature Granulocytes: 0.03 10*3/uL (ref 0.00–0.07)
Basophils Absolute: 0 10*3/uL (ref 0.0–0.1)
Basophils Relative: 1 %
Eosinophils Absolute: 0.2 10*3/uL (ref 0.0–0.5)
Eosinophils Relative: 3 %
HCT: 40.7 % (ref 36.0–46.0)
Hemoglobin: 13.4 g/dL (ref 12.0–15.0)
Immature Granulocytes: 1 %
Lymphocytes Relative: 9 %
Lymphs Abs: 0.6 10*3/uL — ABNORMAL LOW (ref 0.7–4.0)
MCH: 32.1 pg (ref 26.0–34.0)
MCHC: 32.9 g/dL (ref 30.0–36.0)
MCV: 97.6 fL (ref 80.0–100.0)
Monocytes Absolute: 0.4 10*3/uL (ref 0.1–1.0)
Monocytes Relative: 7 %
Neutro Abs: 5.1 10*3/uL (ref 1.7–7.7)
Neutrophils Relative %: 79 %
Platelets: 218 10*3/uL (ref 150–400)
RBC: 4.17 MIL/uL (ref 3.87–5.11)
RDW: 12.7 % (ref 11.5–15.5)
WBC: 6.4 10*3/uL (ref 4.0–10.5)
nRBC: 0 % (ref 0.0–0.2)

## 2022-12-30 LAB — MAGNESIUM: Magnesium: 2.2 mg/dL (ref 1.7–2.4)

## 2022-12-30 LAB — BRAIN NATRIURETIC PEPTIDE: B Natriuretic Peptide: 329.9 pg/mL — ABNORMAL HIGH (ref 0.0–100.0)

## 2022-12-30 LAB — TROPONIN I (HIGH SENSITIVITY): Troponin I (High Sensitivity): 8 ng/L (ref <18)

## 2022-12-30 MED ORDER — ONDANSETRON HCL 4 MG/2ML IJ SOLN
4.0000 mg | Freq: Three times a day (TID) | INTRAMUSCULAR | Status: DC | PRN
  Administered 2022-12-31: 4 mg via INTRAVENOUS
  Filled 2022-12-30: qty 2

## 2022-12-30 MED ORDER — ACETAMINOPHEN 325 MG PO TABS
650.0000 mg | ORAL_TABLET | Freq: Four times a day (QID) | ORAL | Status: DC | PRN
  Administered 2022-12-30 – 2022-12-31 (×3): 650 mg via ORAL
  Filled 2022-12-30 (×3): qty 2

## 2022-12-30 MED ORDER — EXEMESTANE 25 MG PO TABS
25.0000 mg | ORAL_TABLET | Freq: Every day | ORAL | Status: DC
  Administered 2022-12-31 – 2023-01-01 (×2): 25 mg via ORAL
  Filled 2022-12-30 (×2): qty 1

## 2022-12-30 MED ORDER — LISINOPRIL 20 MG PO TABS
20.0000 mg | ORAL_TABLET | Freq: Every day | ORAL | Status: DC
  Administered 2022-12-30 – 2023-01-01 (×3): 20 mg via ORAL
  Filled 2022-12-30: qty 1
  Filled 2022-12-30: qty 2
  Filled 2022-12-30: qty 1

## 2022-12-30 MED ORDER — ASPIRIN 81 MG PO CHEW
81.0000 mg | CHEWABLE_TABLET | Freq: Every day | ORAL | Status: DC
  Administered 2022-12-30 – 2023-01-01 (×3): 81 mg via ORAL
  Filled 2022-12-30 (×3): qty 1

## 2022-12-30 MED ORDER — DM-GUAIFENESIN ER 30-600 MG PO TB12: 1.0000 | ORAL_TABLET | Freq: Two times a day (BID) | ORAL | Status: DC | PRN

## 2022-12-30 MED ORDER — METOPROLOL TARTRATE 25 MG PO TABS
25.0000 mg | ORAL_TABLET | Freq: Two times a day (BID) | ORAL | Status: DC
  Administered 2022-12-30 – 2023-01-01 (×5): 25 mg via ORAL
  Filled 2022-12-30 (×5): qty 1

## 2022-12-30 MED ORDER — VITAMIN D 25 MCG (1000 UNIT) PO TABS
1000.0000 [IU] | ORAL_TABLET | Freq: Every day | ORAL | Status: DC
  Administered 2022-12-30 – 2023-01-01 (×3): 1000 [IU] via ORAL
  Filled 2022-12-30 (×3): qty 1

## 2022-12-30 MED ORDER — IOHEXOL 350 MG/ML SOLN
100.0000 mL | Freq: Once | INTRAVENOUS | Status: AC | PRN
  Administered 2022-12-30: 100 mL via INTRAVENOUS

## 2022-12-30 MED ORDER — ALBUTEROL SULFATE (2.5 MG/3ML) 0.083% IN NEBU: 3.0000 mL | INHALATION_SOLUTION | RESPIRATORY_TRACT | Status: DC | PRN

## 2022-12-30 MED ORDER — FUROSEMIDE 10 MG/ML IJ SOLN
40.0000 mg | Freq: Two times a day (BID) | INTRAMUSCULAR | Status: DC
  Administered 2022-12-30 – 2023-01-01 (×4): 40 mg via INTRAVENOUS
  Filled 2022-12-30 (×3): qty 4

## 2022-12-30 MED ORDER — FUROSEMIDE 10 MG/ML IJ SOLN
60.0000 mg | Freq: Once | INTRAMUSCULAR | Status: AC
  Administered 2022-12-30: 60 mg via INTRAVENOUS
  Filled 2022-12-30: qty 8

## 2022-12-30 MED ORDER — ONDANSETRON HCL 4 MG/2ML IJ SOLN
4.0000 mg | Freq: Once | INTRAMUSCULAR | Status: AC
  Administered 2022-12-30: 4 mg via INTRAVENOUS
  Filled 2022-12-30: qty 2

## 2022-12-30 MED ORDER — ENOXAPARIN SODIUM 60 MG/0.6ML IJ SOSY
0.5000 mg/kg | PREFILLED_SYRINGE | INTRAMUSCULAR | Status: DC
  Administered 2022-12-30 – 2022-12-31 (×2): 57.5 mg via SUBCUTANEOUS
  Filled 2022-12-30 (×2): qty 0.6

## 2022-12-30 MED ORDER — COENZYME Q10 100 MG PO CAPS: 100.0000 mg | ORAL_CAPSULE | Freq: Every day | ORAL | Status: DC

## 2022-12-30 MED ORDER — HYDRALAZINE HCL 20 MG/ML IJ SOLN
5.0000 mg | INTRAMUSCULAR | Status: DC | PRN
  Administered 2022-12-30: 5 mg via INTRAVENOUS
  Filled 2022-12-30: qty 1

## 2022-12-30 MED ORDER — PANTOPRAZOLE SODIUM 40 MG PO TBEC
40.0000 mg | DELAYED_RELEASE_TABLET | Freq: Every day | ORAL | Status: DC
  Administered 2022-12-30 – 2023-01-01 (×3): 40 mg via ORAL
  Filled 2022-12-30 (×3): qty 1

## 2022-12-30 MED ORDER — ATORVASTATIN CALCIUM 20 MG PO TABS
20.0000 mg | ORAL_TABLET | Freq: Every day | ORAL | Status: DC
  Administered 2022-12-30 – 2022-12-31 (×2): 20 mg via ORAL
  Filled 2022-12-30 (×2): qty 1

## 2022-12-30 MED ORDER — LORATADINE 10 MG PO TABS
10.0000 mg | ORAL_TABLET | Freq: Every day | ORAL | Status: DC | PRN
  Administered 2022-12-31: 10 mg via ORAL
  Filled 2022-12-30: qty 1

## 2022-12-30 NOTE — H&P (Signed)
History and Physical    Kelly Howell [id number] DOB: 1945/11/27 DOA: 12/30/2022  Referring MD/NP/PA:   PCP: [place] Pink, MD   Patient coming from:  The patient is coming from home.     Chief Complaint: SOB  HPI: Kelly Howell is a 77 y.o. female with medical history significant of dCHF, HTN, stroke, IBS, dumping syndrome, breast cancer (s/p of right mastectomy, radiation therapy), who presents with shortness of breath.  Patient states she has SOB for more than 2 days.  No cough or chest pain.  Denies fever or chills.  She has generalized weakness, not feeling well in the past several days.  Patient has nausea, no vomiting, diarrhea or abdominal pain.  No symptoms of UTI.  Patient has worsening bilateral leg edema.  Data reviewed independently and ED Course: pt was found to have BNP 329, WBC 6.4, GFR> 60, trop  8, temperature normal, blood pressure 165/91, heart rate 103, 93, RR 28, oxygen saturation 90-94% on room air.  Chest x-ray showed cardiomegaly and interstitial pulmonary edema.  CTA negative for PE.  Patient is admitted to telemetry bed as inpatient.  CTA:  1. Negative for acute PE or thoracic aortic dissection. 2. Cardiomegaly with bilateral pleural effusions and interstitial and ground-glass pulmonary opacities suggesting CHF. 3. Coronary and aortic Atherosclerosis (ICD10-I70.0)  EKG: I have personally reviewed.    Review of Systems:   General: no fevers, chills, no body weight gain, has fatigue HEENT: no blurry vision, hearing changes or sore throat Respiratory: has dyspnea, no coughing, wheezing CV: no chest pain, no palpitations GI: no nausea, vomiting, abdominal pain, diarrhea, constipation GU: no dysuria, burning on urination, increased urinary frequency, hematuria  Ext: has leg edema Neuro: no unilateral weakness, numbness, or tingling, no vision change or hearing loss Skin: no rash, no skin tear. MSK: No muscle spasm, no deformity, no limitation of range of  movement in spin Heme: No easy bruising.  Travel history: No recent long distant travel.   Allergy:  Allergies  Allergen Reactions   Gantrisin [Sulfisoxazole] Nausea Only   Sulfa Antibiotics Nausea Only   Tape Other (See Comments)    Past Medical History:  Diagnosis Date   Abnormal mammogram    Atypical hyperplasia of breast    Left   Breast cancer ([place]) 2000   left lumpectomy   Colon polyps    COVID    09/2021   Dumping syndrome    Dyspnea    Dysrhythmia    pvc   Hyperlipidemia    Hypertension    IBS (irritable bowel syndrome)    Intermittent constipation    Intermittent diarrhea    Left sided lacunar infarction ([place])    per pt 11/04/21 testing at [facility] thought to be inner ear problem   OA (osteoarthritis)    Obesity    Personal history of radiation therapy    Torus palatinus     Past Surgical History:  Procedure Laterality Date   BREAST BIOPSY Left 2000   Positive   BREAST BIOPSY Left 11/03/2020   affirm bx ,  posterior group coil marker, DCIS NUCLEAR GRADE 2 WITH COMEDONECROSIS AND ASSOCIATED CALCIFICATIONS  ATYPICAL LOBULAR HYPERPLASIA   BREAST BIOPSY Left 11/03/2020   affirm bx, anterior group x marker, DCIS NUCLEAR GRADE 2 WITH COMEDONECROSIS AND ASSOCIATED CALCIFICATIONS  ATYPICAL LOBULAR HYPERPLASIA   BREAST BIOPSY Right 10/04/2021   Stereo Bx, X-clip, path pending   BREAST LUMPECTOMY Left 2000   positive for breast ca  BREAST LUMPECTOMY Left 11/19/2020   NL with SN DCIS   CESAREAN SECTION     CHOLECYSTECTOMY     COLONOSCOPY     COLONOSCOPY WITH PROPOFOL N/A 07/04/2016   Procedure: COLONOSCOPY WITH PROPOFOL;  Surgeon: Lollie Sails, MD;  Location: [facility] ENDOSCOPY;  Service: Endoscopy;  Laterality: N/A;   COLONOSCOPY WITH PROPOFOL N/A 07/21/2021   Procedure: COLONOSCOPY WITH PROPOFOL;  Surgeon: Annamaria Helling, DO;  Location: [facility] ENDOSCOPY;  Service: Gastroenterology;  Laterality: N/A;   Left Wrist Biopsy     pt denies 11/04/21   PARTIAL  MASTECTOMY WITH NEEDLE LOCALIZATION AND AXILLARY SENTINEL LYMPH NODE BX Left 11/19/2020   Procedure: PARTIAL MASTECTOMY WITH NEEDLE LOCALIZATION AND AXILLARY SENTINEL LYMPH NODE BX;  Surgeon: Herbert Pun, MD;  Location: [facility] ORS;  Service: General;  Laterality: Left;   TOTAL MASTECTOMY Right 11/07/2021   Procedure: TOTAL MASTECTOMY WITH SENTINEL LYMPH NODE BIOPSY;  Surgeon: Herbert Pun, MD;  Location: [facility] ORS;  Service: General;  Laterality: Right;    Social History:  reports that she has never smoked. She has never used smokeless tobacco. She reports that she does not drink alcohol and does not use drugs.  Family History:  Family History  Problem Relation Age of Onset   Colon cancer Mother    Breast cancer Maternal Aunt 48   Breast cancer Paternal Grandmother 34     Prior to Admission medications   Medication Sig Start Date End Date Taking? Authorizing Provider  acetaminophen (TYLENOL) 500 MG tablet Take 1,000 mg by mouth every 6 (six) hours as needed for moderate pain.    [provider]  aspirin 81 MG chewable tablet Chew 81 mg by mouth daily.    [provider]  atorvastatin (LIPITOR) 20 MG tablet Take 20 mg by mouth at bedtime.    [provider]  cetirizine (ZYRTEC) 10 MG tablet Take 10 mg by mouth daily as needed for allergies.    [provider]  Coenzyme Q10 100 MG capsule Take 100 mg by mouth daily.    [provider]  cyanocobalamin (,VITAMIN B-12,) 1000 MCG/ML injection Inject 1,000 mcg into the muscle every 30 (thirty) days. 06/08/21   [provider]  exemestane (AROMASIN) 25 MG tablet TAKE ONE TABLET BY MOUTH ONE TIME DAILY AFTER BREAKFAST 12/18/22   Cammie Sickle, MD  fluticasone (FLONASE) 50 MCG/ACT nasal spray Place 2 sprays into both nostrils at bedtime as needed for rhinitis.    [provider]  furosemide (LASIX) 20 MG tablet Take 20 mg by mouth daily. 11/03/21   [provider]  gabapentin (NEURONTIN) 300 MG capsule Take 1 capsule (300 mg total) by mouth 2 (two) times daily for 5 days. 11/08/21 10/24/22  Herbert Pun, MD  HYDROcodone-acetaminophen (NORCO/VICODIN) 5-325 MG tablet Take 1 tablet by mouth every 4 (four) hours as needed for moderate pain. 11/08/21   Herbert Pun, MD  lisinopril (PRINIVIL,ZESTRIL) 20 MG tablet Take 20 mg by mouth daily.    [provider]  metoprolol tartrate (LOPRESSOR) 25 MG tablet Take 25 mg by mouth 2 (two) times daily.    [provider]  naproxen (NAPROSYN) 500 MG tablet Take 500 mg by mouth 2 (two) times daily with a meal.    [provider]  nystatin cream (MYCOSTATIN) Apply 1 application topically daily as needed (Yeast).    [provider]  pantoprazole (PROTONIX) 40 MG tablet Take 40 mg by mouth daily. 04/04/22   [provider]  Vitamin D3 (VITAMIN D) 25 MCG tablet Take 1,000 Units by mouth daily.    [provider]    Physical Exam: Vitals:   12/30/22 1200 12/30/22 1215 12/30/22 1430 12/30/22 1730  BP:   (!) 137/53 129/62  Pulse:   94 79  Resp: (!) 32 19 20 13   Temp:      TempSrc:      SpO2: 93% 94% 98% 92%  Weight:       General: Not in acute distress HEENT:       Eyes: PERRL, EOMI, no scleral icterus.       ENT: No discharge from the ears and nose, no pharynx injection, no tonsillar enlargement.        Neck: positive JVD, no bruit, no mass felt. Heme: No neck lymph node enlargement. Cardiac: S1/S2, RRR, No murmurs, No gallops or rubs. Respiratory: Fine crackles bilaterally GI: Soft, nondistended, nontender, no rebound pain, no organomegaly, BS present. GU: No hematuria Ext: 3+ pitting leg edema bilaterally. 1+DP/PT pulse bilaterally. Musculoskeletal: No joint deformities, No joint redness or warmth, no limitation of ROM in spin. Skin: No rashes.  Neuro: Alert, oriented X3, cranial nerves II-XII grossly intact, moves all extremities  normally.  Psych: Patient is not psychotic, no suicidal or hemocidal ideation.  Labs on Admission: I have personally reviewed following labs and imaging studies  CBC: Recent Labs  Lab 12/30/22 0539  WBC 6.4  NEUTROABS 5.1  HGB 13.4  HCT 40.7  MCV 97.6  PLT 99991111   Basic Metabolic Panel: Recent Labs  Lab 12/30/22 0539  NA 139  K 4.0  CL 112*  CO2 22  GLUCOSE 126*  BUN 23  CREATININE 0.67  CALCIUM 9.0  MG 2.2   GFR: CrCl cannot be calculated (Unknown ideal weight.). Liver Function Tests: Recent Labs  Lab 12/30/22 0539  AST 22  ALT 13  ALKPHOS 121  BILITOT 1.2  PROT 6.6  ALBUMIN 3.2*   No results for input(s): "LIPASE", "AMYLASE" in the last 168 hours. No results for input(s): "AMMONIA" in the last 168 hours. Coagulation Profile: No results for input(s): "INR", "PROTIME" in the last 168 hours. Cardiac Enzymes: No results for input(s): "CKTOTAL", "CKMB", "CKMBINDEX", "TROPONINI" in the last 168 hours. BNP (last 3 results) No results for input(s): "PROBNP" in the last 8760 hours. HbA1C: No results for input(s): "HGBA1C" in the last 72 hours. CBG: No results for input(s): "GLUCAP" in the last 168 hours. Lipid Profile: No results for input(s): "CHOL", "HDL", "LDLCALC", "TRIG", "CHOLHDL", "LDLDIRECT" in the last 72 hours. Thyroid Function Tests: No results for input(s): "TSH", "T4TOTAL", "FREET4", "T3FREE", "THYROIDAB" in the last 72 hours. Anemia Panel: No results for input(s): "VITAMINB12", "FOLATE", "FERRITIN", "TIBC", "IRON", "RETICCTPCT" in the last 72 hours. Urine analysis: No results found for: "COLORURINE", "APPEARANCEUR", "LABSPEC", "PHURINE", "GLUCOSEU", "HGBUR", "BILIRUBINUR", "KETONESUR", "PROTEINUR", "UROBILINOGEN", "NITRITE", "LEUKOCYTESUR" Sepsis Labs: @LABRCNTIP (procalcitonin:4,lacticidven:4) ) Recent Results (from the past 240 hour(s))  Resp panel by RT-PCR (RSV, Flu A&B, Covid) Anterior Nasal Swab     Status: None   Collection Time: 12/30/22   5:39 AM   Specimen: Anterior Nasal Swab  Result Value Ref Range Status   SARS Coronavirus 2 by RT PCR NEGATIVE NEGATIVE Final    Comment: (NOTE) SARS-CoV-2 target nucleic acids are NOT DETECTED.  The SARS-CoV-2 RNA is generally detectable in upper respiratory specimens during the acute phase of infection. The lowest concentration of SARS-CoV-2 viral copies this assay can detect is 138 copies/mL. A negative result does not preclude  SARS-Cov-2 infection and should not be used as the sole basis for treatment or other patient management decisions. A negative result may occur with  improper specimen collection/handling, submission of specimen other than nasopharyngeal swab, presence of viral mutation(s) within the areas targeted by this assay, and inadequate number of viral copies(<138 copies/mL). A negative result must be combined with clinical observations, patient history, and epidemiological information. The expected result is Negative.  Fact Sheet for Patients:  EntrepreneurPulse.com.au  Fact Sheet for Healthcare Providers:  IncredibleEmployment.be  This test is no t yet approved or cleared by the [place] FDA and  has been authorized for detection and/or diagnosis of SARS-CoV-2 by FDA under an Emergency Use Authorization (EUA). This EUA will remain  in effect (meaning this test can be used) for the duration of the COVID-19 declaration under Section 564(b)(1) of the Act, 21 U.S.C.section 360bbb-3(b)(1), unless the authorization is terminated  or revoked sooner.       Influenza A by PCR NEGATIVE NEGATIVE Final   Influenza B by PCR NEGATIVE NEGATIVE Final    Comment: (NOTE) The Xpert Xpress SARS-CoV-2/FLU/RSV plus assay is intended as an aid in the diagnosis of influenza from Nasopharyngeal swab specimens and should not be used as a sole basis for treatment. Nasal washings and aspirates are unacceptable for Xpert Xpress  SARS-CoV-2/FLU/RSV testing.  Fact Sheet for Patients: EntrepreneurPulse.com.au  Fact Sheet for Healthcare Providers: IncredibleEmployment.be  This test is not yet approved or cleared by the [place] FDA and has been authorized for detection and/or diagnosis of SARS-CoV-2 by FDA under an Emergency Use Authorization (EUA). This EUA will remain in effect (meaning this test can be used) for the duration of the COVID-19 declaration under Section 564(b)(1) of the Act, 21 U.S.C. section 360bbb-3(b)(1), unless the authorization is terminated or revoked.     Resp Syncytial Virus by PCR NEGATIVE NEGATIVE Final    Comment: (NOTE) Fact Sheet for Patients: EntrepreneurPulse.com.au  Fact Sheet for Healthcare Providers: IncredibleEmployment.be  This test is not yet approved or cleared by the [place] FDA and has been authorized for detection and/or diagnosis of SARS-CoV-2 by FDA under an Emergency Use Authorization (EUA). This EUA will remain in effect (meaning this test can be used) for the duration of the COVID-19 declaration under Section 564(b)(1) of the Act, 21 U.S.C. section 360bbb-3(b)(1), unless the authorization is terminated or revoked.  Performed at [facility], [address] [place], [place] [address]      Radiological Exams on Admission: CT Angio Chest PE W and/or Wo Contrast  Result Date: 12/30/2022 CLINICAL DATA:  dyspnea, back pain,. breast cancer EXAM: CT ANGIOGRAPHY CHEST WITH CONTRAST TECHNIQUE: Multidetector CT imaging of the chest was performed using the standard protocol during bolus administration of intravenous contrast. Multiplanar CT image reconstructions and MIPs were obtained to evaluate the vascular anatomy. RADIATION DOSE REDUCTION: This exam was performed according to the departmental dose-optimization program which includes automated exposure control, adjustment of  the mA and/or kV according to patient size and/or use of iterative reconstruction technique. CONTRAST:  160mL OMNIPAQUE IOHEXOL 350 MG/ML SOLN COMPARISON:  None Available. FINDINGS: Cardiovascular: SVC patent. Mild cardiomegaly with left atrial enlargement. No pericardial effusion. The RV is nondilated. Satisfactory opacification of pulmonary arteries noted, and there is no evidence of pulmonary emboli. Scattered coronary calcifications. Adequate contrast opacification of the thoracic aorta with no evidence of dissection, aneurysm, or stenosis. There is classic 3-vessel brachiocephalic arch anatomy without proximal stenosis. Mild scattered aortic plaque. Mediastinum/Nodes: No mass  or adenopathy. Lungs/Pleura: Bilateral moderate pleural effusions. No pneumothorax. Peripheral interstitial prominence bilaterally with geographic ground-glass opacities most marked in the upper lobes. Some scattered airspace disease in the posterior right upper lobe. Dependent atelectasis in the lung bases. Upper Abdomen: Cholecystectomy clips.  No acute findings. Musculoskeletal: Right shoulder DJD. Vertebral endplate spurring at multiple levels in the mid thoracic spine. No fracture or worrisome bone lesion. Review of the MIP images confirms the above findings. IMPRESSION: 1. Negative for acute PE or thoracic aortic dissection. 2. Cardiomegaly with bilateral pleural effusions and interstitial and ground-glass pulmonary opacities suggesting CHF. 3. Coronary and aortic Atherosclerosis (ICD10-I70.0). Electronically Signed   By: Lucrezia Europe M.D.   On: 12/30/2022 07:58   DG Chest Portable 1 View  Result Date: 12/30/2022 CLINICAL DATA:  Shortness of breath.  Palpitations. EXAM: PORTABLE CHEST 1 VIEW COMPARISON:  None Available. FINDINGS: The cardio pericardial silhouette is enlarged. Diffuse interstitial opacity suggests edema. No focal consolidation or substantial pleural effusion. The visualized bony structures of the thorax are  unremarkable. Telemetry leads overlie the chest. IMPRESSION: Enlargement of the cardiopericardial silhouette with diffuse interstitial opacity suggesting edema. Electronically Signed   By: Misty Stanley M.D.   On: 12/30/2022 06:11      Assessment/Plan Principal Problem:   Acute on chronic diastolic CHF (congestive heart failure) (HCC) Active Problems:   HTN (hypertension)   HLD (hyperlipidemia)   Stroke (HCC)   Breast cancer (HCC)   Obesity, Class III, BMI 40-49.9 (morbid obesity) ([place])   Assessment and Plan:  Acute on chronic diastolic CHF (congestive heart failure) ([place]): 2D echo on 06/01/2016 showed EF> 55% with grade 1 diastolic dysfunction.  Patient has 3+ leg edema, positive JVD,  crackles on auscultation, pulm edema chest x-ray, elevated BNP 329, clinically is consistent with CHF exacerbation.  -Will admit to tele bed as inpatient -Lasix 40 mg bid by IV (patient received 60 mg of Lasix in ED.) -2d echo -Daily weights -strict I/O's -Low salt diet -Fluid restriction  HTN (hypertension) -IV hydralazine as needed -Lisinopril, metoprolol  HLD (hyperlipidemia) -Lipitor  Stroke (HCC) -Aspirin and Lipitor  History of breast cancer (HCC) -Continue home Aromasin  Obesity, Class III, BMI 40-49.9 (morbid obesity) ([place]): Body weight 114.3 kg, BMI 43.24 -Encouraged losing weight -Healthy diet and exercise      DVT ppx:  SQ Lovenox  Code Status: Full code  Family Communication: I offered to call her family, but patient does not want me to call her family, she states that she will call her family by herself.  Disposition Plan:  Anticipate discharge back to previous environment  Consults called:  none  Admission status and Level of care: Telemetry Cardiac:    as inpt      Dispo: The patient is from: Home              Anticipated d/c is to: Home              Anticipated d/c date is: 2 days              Patient currently is not medically stable to  d/c.    Severity of Illness:  The appropriate patient status for this patient is INPATIENT. Inpatient status is judged to be reasonable and necessary in order to provide the required intensity of service to ensure the patient's safety. The patient's presenting symptoms, physical exam findings, and initial radiographic and laboratory data in the context of their chronic comorbidities is felt to place them at  high risk for further clinical deterioration. Furthermore, it is not anticipated that the patient will be medically stable for discharge from the hospital within 2 midnights of admission.   * I certify that at the point of admission it is my clinical judgment that the patient will require inpatient hospital care spanning beyond 2 midnights from the point of admission due to high intensity of service, high risk for further deterioration and high frequency of surveillance required.*       Date of Service 12/30/2022    Ivor Costa [facility]   If 7PM-7AM, please contact night-coverage [email or web address] 12/30/2022, 6:07 PM

## 2022-12-30 NOTE — ED Triage Notes (Signed)
BIB EMS/ pt states feeling "unwell" x2 days/ pt reports elevated B/P and mild SHOB/ denies CP

## 2022-12-30 NOTE — ED Notes (Signed)
Dr Niu at bedside 

## 2022-12-30 NOTE — ED Provider Notes (Signed)
[facility] Provider Note    Event Date/Time   First MD Initiated Contact with Patient 12/30/22 [phone]     (approximate)   History   Shortness of Breath   HPI  Kelly Howell is a 77 y.o. female who presents to the ED for evaluation of Shortness of Breath   I review a cardiology clinic visit from October.  Obese patient with history of HTN, HLD, stroke and breast cancer with mastectomy.  Patient presents to the ED for evaluation of of dyspnea, chest discomfort and malaise over the past 2 days.  No cough, fever, abdominal pain or syncope.  She does report chronic lower extremity swelling  Physical Exam   Triage Vital Signs: ED Triage Vitals  Enc Vitals Group     BP --      Pulse Rate 12/30/22 0536 (!) 103     Resp 12/30/22 0536 (!) 21     Temp 12/30/22 0536 98.2 F (36.8 C)     Temp Source 12/30/22 0536 Oral     SpO2 12/30/22 0534 96 %     Weight --      Height --      Head Circumference --      Peak Flow --      Pain Score 12/30/22 0538 0     Pain Loc --      Pain Edu? --      Excl. in [place]? --     Most recent vital signs: Vitals:   12/30/22 0536 12/30/22 0542  BP:  (!) 227/108  Pulse: (!) 103   Resp: (!) 21   Temp: 98.2 F (36.8 C)   SpO2:  94%    General: Awake, no distress.  Sometimes taking breaks to catch her breath when speaking in full sentences CV:  Good peripheral perfusion.  Tachycardic and regular with rates in the low 100s.  Hypertensive Resp:  Mild tachypnea to the low/mid 20s. Abd:  No distention.  MSK:  No deformity noted.  Pitting edema to bilateral lower extremities is noted Neuro:  No focal deficits appreciated. Other:     ED Results / Procedures / Treatments   Labs (all labs ordered are listed, but only abnormal results are displayed) Labs Reviewed  COMPREHENSIVE METABOLIC PANEL - Abnormal; Notable for the following components:      Result Value   Chloride 112 (*)    Glucose, Bld 126 (*)    Albumin 3.2 (*)     All other components within normal limits  CBC WITH DIFFERENTIAL/PLATELET - Abnormal; Notable for the following components:   Lymphs Abs 0.6 (*)    All other components within normal limits  BRAIN NATRIURETIC PEPTIDE - Abnormal; Notable for the following components:   B Natriuretic Peptide 329.9 (*)    All other components within normal limits  RESP PANEL BY RT-PCR (RSV, FLU A&B, COVID)  [id number]  MAGNESIUM  TROPONIN I (HIGH SENSITIVITY)    EKG Sinus rhythm with a rate of 98 bpm.  Normal axis and intervals.  No clear signs of acute ischemia.  RADIOLOGY 1 view CXR interpreted by me with pulmonary vascular congestion and cardiomegaly but no discrete infiltration  Official radiology report(s): DG Chest Portable 1 View  Result Date: 12/30/2022 CLINICAL DATA:  Shortness of breath.  Palpitations. EXAM: PORTABLE CHEST 1 VIEW COMPARISON:  None Available. FINDINGS: The cardio pericardial silhouette is enlarged. Diffuse interstitial opacity suggests edema. No focal consolidation or substantial pleural effusion. The visualized bony  structures of the thorax are unremarkable. Telemetry leads overlie the chest. IMPRESSION: Enlargement of the cardiopericardial silhouette with diffuse interstitial opacity suggesting edema. Electronically Signed   By: Misty Stanley M.D.   On: 12/30/2022 06:11    PROCEDURES and INTERVENTIONS:  .1-3 Lead EKG Interpretation  Performed by: Vladimir Crofts, MD Authorized by: Vladimir Crofts, MD     Interpretation: abnormal     ECG rate:  102   ECG rate assessment: tachycardic     Rhythm: sinus tachycardia     Ectopy: none     Conduction: normal     Medications  furosemide (LASIX) injection 60 mg (has no administration in time range)  ondansetron (ZOFRAN) injection 4 mg (4 mg Intravenous Given 12/30/22 0643)     IMPRESSION / MDM / ASSESSMENT AND PLAN / ED COURSE  I reviewed the triage vital signs and the nursing notes.  Differential diagnosis includes, but is not  limited to, ACS, PTX, PNA, muscle strain/spasm, PE, dissection, new onset CHF  {Patient presents with symptoms of an acute illness or injury that is potentially life-threatening.  77 year old woman with history of breast cancer presents with dyspnea and chest discomfort and some evidence of new onset CHF awaiting a CTA chest to look for PE.  First troponin is negative and EKG is nonischemic and sinus tachycardia.  CBC and metabolic panel are essentially normal.  BNP is elevated and has no comparison.  We will initiate diuresis and CTA chest.  Signed out to oncoming provider pending this study.  Anticipate medical admission      FINAL CLINICAL IMPRESSION(S) / ED DIAGNOSES   Final diagnoses:  SOB (shortness of breath)     Rx / DC Orders   ED Discharge Orders     None        Note:  This document was prepared using Dragon voice recognition software and may include unintentional dictation errors.   Vladimir Crofts, MD 12/30/22 [phone]

## 2022-12-30 NOTE — ED Provider Notes (Signed)
IMPRESSION: 1. Negative for acute PE or thoracic aortic dissection. 2. Cardiomegaly with bilateral pleural effusions and interstitial and ground-glass pulmonary opacities suggesting CHF. 3. Coronary and aortic Atherosclerosis (ICD10-I70.0).  Discussed with patient to results.  Oxygen levels are 90 to 92% on room air.  Will discuss the hospital team for admission   Vanessa [place], MD 12/30/22 [phone]

## 2022-12-30 NOTE — ED Notes (Signed)
Pt. Sitting up in bed, eating breakfast, requested cordless phone to call her husband. Phone provided to pt.pt. denies any further need currently.

## 2022-12-30 NOTE — ED Notes (Signed)
Pt. To CT

## 2022-12-30 NOTE — ED Notes (Signed)
Pt. Returned from CT, CT tech states pt. Urinated in bed and is now wet. This RN and Maudie Mercury, Advertising copywriter to bedside. Pt. Stood at bedside with stand by assist, cleansed, bed linens changed. Purewick replaced and repositioned. Pt. Repositioned for comfort. Denies any further need.

## 2022-12-30 NOTE — Progress Notes (Signed)
PHARMACIST - PHYSICIAN COMMUNICATION  CONCERNING:  Enoxaparin (Lovenox) for DVT Prophylaxis    RECOMMENDATION: Patient was prescribed enoxaparin 40mg  q24 hours for VTE prophylaxis.   Filed Weights   12/30/22 0538  Weight: 114.3 kg (251 lb 14.4 oz)    Body mass index is 43.24 kg/m.  CrCl cannot be calculated (Unknown ideal weight.).  Based on [place] patient is candidate for enoxaparin 0.5mg /kg TBW SQ every 24 hours based on BMI being >30.  DESCRIPTION: Pharmacy has adjusted enoxaparin dose per [facility] policy.  Patient is now receiving enoxaparin 57.5 mg every 24 hours   Benita Gutter 12/30/2022 8:49 AM

## 2022-12-31 ENCOUNTER — Inpatient Hospital Stay
Admit: 2022-12-31 | Discharge: 2022-12-31 | Disposition: A | Discharge status: Home / Self Care | Payer: Medicare Other | Attending: Internal Medicine | Admitting: Internal Medicine

## 2022-12-31 DIAGNOSIS — I5033 Acute on chronic diastolic (congestive) heart failure: Secondary | ICD-10-CM | POA: Diagnosis not present

## 2022-12-31 LAB — BASIC METABOLIC PANEL
Anion gap: 9 (ref 5–15)
BUN: 20 mg/dL (ref 8–23)
CO2: 25 mmol/L (ref 22–32)
Calcium: 8.5 mg/dL — ABNORMAL LOW (ref 8.9–10.3)
Chloride: 107 mmol/L (ref 98–111)
Creatinine, Ser: 0.7 mg/dL (ref 0.44–1.00)
GFR, Estimated: >60 mL/min (ref >60)
Glucose, Bld: 94 mg/dL (ref 70–99)
Potassium: 3.5 mmol/L (ref 3.5–5.1)
Sodium: 141 mmol/L (ref 135–145)

## 2022-12-31 LAB — MAGNESIUM: Magnesium: 2.1 mg/dL (ref 1.7–2.4)

## 2022-12-31 LAB — ECHOCARDIOGRAM COMPLETE
AV Peak grad: 8.9 mmHg
Ao pk vel: 1.49 m/s
Area-P 1/2: 3.91 cm2
Height: 66 in
S' Lateral: 3 cm
Weight: 3708.8 oz

## 2022-12-31 MED ORDER — POTASSIUM CHLORIDE CRYS ER 20 MEQ PO TBCR
40.0000 meq | EXTENDED_RELEASE_TABLET | Freq: Once | ORAL | Status: AC
  Administered 2022-12-31: 40 meq via ORAL
  Filled 2022-12-31: qty 2

## 2022-12-31 MED ORDER — FLUTICASONE PROPIONATE 50 MCG/ACT NA SUSP
1.0000 | Freq: Every day | NASAL | Status: DC
  Administered 2022-12-31 – 2023-01-01 (×2): 1 via NASAL
  Filled 2022-12-31: qty 16

## 2022-12-31 MED ORDER — PERFLUTREN LIPID MICROSPHERE
1.0000 mL | INTRAVENOUS | Status: AC | PRN
  Administered 2022-12-31: 4 mL via INTRAVENOUS

## 2022-12-31 NOTE — Assessment & Plan Note (Signed)
Continue lisinopril, metoprolol IV hydralazine PRN

## 2022-12-31 NOTE — Assessment & Plan Note (Signed)
Body mass index is 37.41 kg/m. Complicates overall care and prognosis.  Recommend lifestyle modifications including physical activity and diet for weight loss and overall long-term health.

## 2022-12-31 NOTE — Assessment & Plan Note (Signed)
-  Continue Lipitor °

## 2022-12-31 NOTE — Progress Notes (Signed)
Progress Note   Patient: Kelly Howell [id number] DOB: 05/08/1946 DOA: 12/30/2022     1 DOS: the patient was seen and examined on 12/31/2022   Brief hospital course: KATRINE BEYE is a 77 y.o. female with medical history significant of dCHF, HTN, stroke, IBS, dumping syndrome, breast cancer (s/p of right mastectomy, radiation therapy), who presented to the ED on 12/30/2022 for evaluation of shortness of breath, generalized weakness, and increased bilateral leg swelling.  No cough, chest pain or fever/chills.   ED evaluation notable for elevated BP 165/91, HR 103, RR 28, spO2 90-04% on room air.  Labs with elevated BNP 329. Chest xray showed cardiomegaly and interstitial edema.  CTA was obtained, negative for PE, showed bilateral pleural effusions and interstitial edema changes.  Patient was admitted to the hospital and started on IV Lasix.  Assessment and Plan: * Acute on chronic diastolic CHF (congestive heart failure) ([place]) Prior Echo on 06/01/2016 showed EF> 55% with grade 1 diastolic dysfunction.  She presented with 3+ BLE edema, positive JVD, crackles on lung exam, pulm edema on imaging and elevated BNP 329. Echo this admission -- EF 55-60%, no WMA's, grade I diastolic dysfunction, mild MR --Continue Lasix 40 mg IV BID --Strict Io's, Daily weights --Monitor renal function, electroyltes --Low sodium diet, fluid restriction  HTN (hypertension) Continue lisinopril, metoprolol IV hydralazine PRN  HLD (hyperlipidemia) Continue Lipitor  Stroke (HCC) No acute issues. Continue aspirin, Lipitor  Breast cancer ([place]) S/p right mastectomy and radiation. --Continue home Aromasin --Follow up as scheduled with oncology  Obesity, Class III, BMI 40-49.9 (morbid obesity) (HCC) Body mass index is 37.41 kg/m. Complicates overall care and prognosis.  Recommend lifestyle modifications including physical activity and diet for weight loss and overall long-term health.        Subjective:  Pt seen awake resting in bed this AM.  She reports significant improvement in her dyspnea and leg swelling, reports high urine output with IV lasix.  She reports nausea earlier, no vomiting, improved with Zofran. Also reports some sinus congestion, possibly allergies, agreeable to adding Flonase.  Has mild headache related to this today as well.  No other acute complaints.    Physical Exam: Vitals:   12/31/22 0254 12/31/22 0651 12/31/22 0902 12/31/22 1241  BP: (!) 161/80  (!) 171/71 (!) 145/74  Pulse: 74  74 71  Resp:   18 18  Temp:   98.6 F (37 C) 98.3 F (36.8 C)  TempSrc:      SpO2: 92%  (!) 89% 90%  Weight:  105.1 kg    Height:       General exam: awake, alert, no acute distress, obese HEENT: moist mucus membranes, hearing grossly normal  Respiratory system: faint bibasilar crackles, no wheezes, normal respiratory effort. Cardiovascular system: normal S1/S2, RRR, +JVP, 1+ BLE edema.   Gastrointestinal system: soft, NT, ND, no HSM felt, +bowel sounds. Central nervous system: A&O x4. no gross focal neurologic deficits, normal speech Extremities: moves all, 1+ BLE edema, normal tone Skin: dry, intact, normal temperature Psychiatry: normal mood, congruent affect, judgement and insight appear normal    Data Reviewed:  Notable labs --- normal BMP except Ca 8.5.  K 3.5.  Cr stable 0.67 >> 0.70   ECHO -- normal EF 55-60%, grade I DD, mild MR  Family Communication: None present.  Pt is fully A&O and able to update.  Disposition: Status is: Inpatient Remains inpatient appropriate because: remains on IV diuresis pending further clinical improvement   Planned  Discharge Destination: Home    Time spent: 42 minutes  Author: Ezekiel Slocumb, DO 12/31/2022 3:35 PM  For on call review [email or web address].

## 2022-12-31 NOTE — Assessment & Plan Note (Signed)
No acute issues. Continue aspirin, Lipitor

## 2022-12-31 NOTE — Hospital Course (Signed)
Kelly Howell is a 77 y.o. female with medical history significant of dCHF, HTN, stroke, IBS, dumping syndrome, breast cancer (s/p of right mastectomy, radiation therapy), who presented to the ED on 12/30/2022 for evaluation of shortness of breath, generalized weakness, and increased bilateral leg swelling.  No cough, chest pain or fever/chills.   ED evaluation notable for elevated BP 165/91, HR 103, RR 28, spO2 90-04% on room air.  Labs with elevated BNP 329. Chest xray showed cardiomegaly and interstitial edema.  CTA was obtained, negative for PE, showed bilateral pleural effusions and interstitial edema changes.  Patient was admitted to the hospital and started on IV Lasix.

## 2022-12-31 NOTE — Assessment & Plan Note (Signed)
S/p right mastectomy and radiation. --Continue home Aromasin --Follow up as scheduled with oncology

## 2022-12-31 NOTE — Progress Notes (Signed)
  Echocardiogram 2D Echocardiogram has been performed. Definity IV ultrasound imaging agent used on this study.  Kelly Howell 12/31/2022, 9:38 AM

## 2022-12-31 NOTE — Assessment & Plan Note (Signed)
Prior Echo on 06/01/2016 showed EF> 55% with grade 1 diastolic dysfunction.  She presented with 3+ BLE edema, positive JVD, crackles on lung exam, pulm edema on imaging and elevated BNP 329. Echo this admission -- EF 55-60%, no WMA's, grade I diastolic dysfunction, mild MR --Treated with Lasix 40 mg IV BID with excellent effect and return to euvolemia --Discharge on 20 mg PO Lasix daily --Follow up 01/17/23 as scheduled with Dr. Clayborn Bigness --Daily weights --Monitor renal function, electroyltes --Low sodium diet, fluid restriction

## 2023-01-01 DIAGNOSIS — I5033 Acute on chronic diastolic (congestive) heart failure: Secondary | ICD-10-CM | POA: Diagnosis not present

## 2023-01-01 LAB — BASIC METABOLIC PANEL
Anion gap: 11 (ref 5–15)
BUN: 23 mg/dL (ref 8–23)
CO2: 27 mmol/L (ref 22–32)
Calcium: 8.9 mg/dL (ref 8.9–10.3)
Chloride: 103 mmol/L (ref 98–111)
Creatinine, Ser: 0.83 mg/dL (ref 0.44–1.00)
GFR, Estimated: >60 mL/min (ref >60)
Glucose, Bld: 87 mg/dL (ref 70–99)
Potassium: 4.1 mmol/L (ref 3.5–5.1)
Sodium: 141 mmol/L (ref 135–145)

## 2023-01-01 MED ORDER — FUROSEMIDE 20 MG PO TABS: 20.0000 mg | ORAL_TABLET | Freq: Every day | ORAL | 1 refills | Status: DC

## 2023-01-01 MED ORDER — NAPROXEN 500 MG PO TABS: 500.0000 mg | ORAL_TABLET | Freq: Two times a day (BID) | ORAL | Status: DC | PRN

## 2023-01-01 NOTE — Discharge Instructions (Signed)

## 2023-01-01 NOTE — Evaluation (Addendum)
Physical Therapy Evaluation Patient Details Name: PRESCIOUS SUCHER MRN: [id number] DOB: 01/03/1946 Today's Date: 01/01/2023  History of Present Illness  Pt is a 77 y/o F admitted on 12/30/22 after presenting with c/o SOB, generalized weakness, & increased B LE swelling. Pt is being treated for acute on chronic diastolic CHF. PMH: dCHF, HTN, stroke, IBS, dumping syndrome, breast CA (s/p R mastectomy & radiation therapy)  Clinical Impression  Pt seen for PT evaluation with pt agreeable. 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 On this date, pt is able to complete bed mobility with mod I, STS from EOB & toilet with mod I, and ambulate into hallway with RW & mod I. Pt is able to walk ~145 ft but overall gait distance is limited by chronic knee pain. Recommend pt continue to mobilize while in acute setting.  At this time, pt does not demonstrate any further acute PT needs. PT to complete current orders.    Recommendations for follow up therapy are one component of a multi-disciplinary discharge planning process, led by the attending physician.  Recommendations may be updated based on patient status, additional functional criteria and insurance authorization.  Follow Up Recommendations       Assistance Recommended at Discharge PRN  Patient can return home with the following  Assistance with cooking/housework    Equipment Recommendations None recommended by PT  Recommendations for Other Services       Functional Status Assessment Patient has not had a recent decline in their functional status     Precautions / Restrictions Precautions Precautions: None Restrictions Weight Bearing Restrictions: No      Mobility  Bed Mobility Overal bed mobility: Modified Independent             General bed mobility comments: supine>sit with HOB elevated    Transfers Overall transfer level: Modified  independent Equipment used: Rolling walker (2 wheels)               General transfer comment: STS from EOB, toilet with grab bar    Ambulation/Gait Ambulation/Gait assistance: Modified independent (Device/Increase time) Gait Distance (Feet): 145 Feet Assistive device: Rolling walker (2 wheels) Gait Pattern/deviations: Decreased step length - right, Decreased step length - left, Decreased stride length Gait velocity: decreased        Stairs            Wheelchair Mobility    Modified Rankin (Stroke Patients Only)       Balance Overall balance assessment: Mild deficits observed, not formally tested   Sitting balance-Leahy Scale: Good       Standing balance-Leahy Scale: Good                               Pertinent Vitals/Pain Pain Assessment Pain Assessment: Faces Faces Pain Scale: Hurts little more Pain Location: BLE knees with activity Pain Descriptors / Indicators: Discomfort Pain Intervention(s): Monitored during session    Home Living Family/patient expects to be discharged to:: Private residence Living Arrangements: Spouse/significant other Available Help at Discharge: Family Type of Home: House Home Access: Ramped entrance       Home Layout: One level Home Equipment: Rollator (4 wheels)      Prior Function Prior Level of Function : Independent/Modified Independent;Driving             Mobility Comments: Pt is mod  I with rollator, denies falls, is the caregiver for her 19 y/o twin sons (both with Down Syndrome), one of whom she helps with bathing. Pt reports she uses rollator 2/2 BLE knee pain & putting off joint replacement.       Hand Dominance        Extremity/Trunk Assessment   Upper Extremity Assessment Upper Extremity Assessment: Overall WFL for tasks assessed    Lower Extremity Assessment Lower Extremity Assessment: Overall WFL for tasks assessed (chronic BLE knee pain)       Communication    Communication: No difficulties  Cognition Arousal/Alertness: Awake/alert Behavior During Therapy: WFL for tasks assessed/performed Overall Cognitive Status: Within Functional Limits for tasks assessed                                 General Comments: Very pleasant lady, agreeable to session        General Comments General comments (skin integrity, edema, etc.): Pt with continent void on toilet, performing peri hygiene without assistance.    Exercises     Assessment/Plan    PT Assessment Patient does not need any further PT services  PT Problem List         PT Treatment Interventions      PT Goals (Current goals can be found in the Care Plan section)  Acute Rehab PT Goals Patient Stated Goal: get better, go home PT Goal Formulation: With patient Time For Goal Achievement: 01/15/23 Potential to Achieve Goals: Good    Frequency       Co-evaluation               AM-PAC PT "6 Clicks" Mobility  Outcome Measure Help needed turning from your back to your side while in a flat bed without using bedrails?: None Help needed moving from lying on your back to sitting on the side of a flat bed without using bedrails?: None Help needed moving to and from a bed to a chair (including a wheelchair)?: None Help needed standing up from a chair using your arms (e.g., wheelchair or bedside chair)?: None Help needed to walk in hospital room?: None Help needed climbing 3-5 steps with a railing? : A Little 6 Click Score: 23    End of Session   Activity Tolerance: Patient tolerated treatment well Patient left: in chair;with call bell/phone within reach Nurse Communication: Mobility status (pt voided during session)      Time: 0955-1010 PT Time Calculation (min) (ACUTE ONLY): 15 min   Charges:   PT Evaluation $PT Eval Moderate Complexity: [place], PT, DPT 01/01/23, 10:19 AM   Waunita Schooner 01/01/2023, 10:17 AM

## 2023-01-01 NOTE — Assessment & Plan Note (Signed)
Pt was using naproxen 500 BID long term, can lead to some fluid retention.  Discussed with patient using OTC topical Voltaren gel, Tylenol either scheduled or PRN.  Limit use of oral naproxen as much as possible to prevent increased fluid retention. --PCP and/or ortho follow up

## 2023-01-01 NOTE — Consult Note (Signed)
   Heart Failure Nurse Navigator Note  HFpEF 5 to 60%.  Grade 1 diastolic dysfunction.  Mild mitral regurgitation.  She presented to the emergency room with complaints of worsening shortness of breath, generalized weakness and increased bilateral lower extremity edema.  BNP was 329.  Chest x-ray revealed cardiomegaly with interstitial edema.  Comorbidities:  Hypertension Stroke IBS Breast cancer Dumping syndrome  Medications:  Aspirin 81 mg daily Atorvastatin 20 mg daily Furosemide 40 mg IV every 12 hours Lisinopril 20 mg daily Metoprolol tartrate 25 mg 2 times a day  Labs:  Sodium 141, potassium 4.1, chloride 103, CO2 27, BUN 23, creatinine 0.83, estimated GFR greater than 60. Weight is 102.7 kg Intake 720 mL Output 3350 mL  Initial meeting with patient on this admission.  She states that she is familiar with the term congestive heart failure as her husband has heart failure.  Discussed the importance of low-sodium diet and taking in no more than 2000 mg a day.  Patient states that she cooks with very little salt but she states that her husband likes to cook with a lot of salt.  Went over the relationship between salt and fluids.  She states that they use canned vegetables, but they do rinse the green beans.  Patient states that she does not weigh herself on a daily basis.  Discussed weighing daily, first thing in the morning after going to the bathroom before eating or drinking anything and with either in the nude or with the same amount of close on.  And then to record those weight so if you see that you gain 2 to 3 pounds overnight or 5 pounds in a week to call your care providers.  Also discussed changes in symptoms such as PND, orthopnea, lower extremity edema and increasing shortness of breath.  Also discussed follow-up in the outpatient heart failure clinic that it that appointment does not take the place of their cardiologist.  She voices understanding.  She was given  the living with heart failure teaching booklet, zone magnet, info on low-sodium and heart failure along with weight chart.  She has an appointment at the heart failure clinic on April 1 at 9 AM.  She is got a 1% no-show which is 1 out of 84 appointments missed.  She had no further questions.  Pricilla Riffle RN [facility]

## 2023-01-01 NOTE — Discharge Summary (Signed)
Physician Discharge Summary   Patient: Kelly Howell MRN: [id number] DOB: August 08, 1946  Admit date:     12/30/2022  Discharge date: 01/01/23  Discharge Physician: Ezekiel Slocumb   PCP: [place] Pink, MD   Recommendations at discharge:   Follow up as scheduled with Cardiology, Dr. Clayborn Bigness on 01/17/23 Follow up with Primary Care in 1-2 weeks Repeat BMP, Mg, CBC in 1-2 weeks  Discharge Diagnoses: Principal Problem:   Acute on chronic diastolic CHF (congestive heart failure) (HCC) Active Problems:   Primary osteoarthritis of both knees   HTN (hypertension)   HLD (hyperlipidemia)   History of stroke   Breast cancer (HCC)   Obesity, Class III, BMI 40-49.9 (morbid obesity) ([place])  Resolved Problems:   * No resolved hospital problems. [facility] Course: Kelly Howell is a 77 y.o. female with medical history significant of dCHF, HTN, stroke, IBS, dumping syndrome, breast cancer (s/p of right mastectomy, radiation therapy), who presented to the ED on 12/30/2022 for evaluation of shortness of breath, generalized weakness, and increased bilateral leg swelling.  No cough, chest pain or fever/chills.   ED evaluation notable for elevated BP 165/91, HR 103, RR 28, spO2 90-04% on room air.  Labs with elevated BNP 329. Chest xray showed cardiomegaly and interstitial edema.  CTA was obtained, negative for PE, showed bilateral pleural effusions and interstitial edema changes.  Patient was admitted to the hospital and started on IV Lasix.  Assessment and Plan: * Acute on chronic diastolic CHF (congestive heart failure) ([place]) Prior Echo on 06/01/2016 showed EF> 55% with grade 1 diastolic dysfunction.  She presented with 3+ BLE edema, positive JVD, crackles on lung exam, pulm edema on imaging and elevated BNP 329. Echo this admission -- EF 55-60%, no WMA's, grade I diastolic dysfunction, mild MR --Treated with Lasix 40 mg IV BID with excellent effect and return to euvolemia --Discharge on 20 mg PO  Lasix daily --Follow up 01/17/23 as scheduled with Dr. Clayborn Bigness --Daily weights --Monitor renal function, electroyltes --Low sodium diet, fluid restriction  Primary osteoarthritis of both knees Pt was using naproxen 500 BID long term, can lead to some fluid retention.  Discussed with patient using OTC topical Voltaren gel, Tylenol either scheduled or PRN.  Limit use of oral naproxen as much as possible to prevent increased fluid retention. --PCP and/or ortho follow up  HTN (hypertension) Continue lisinopril, metoprolol IV hydralazine PRN  HLD (hyperlipidemia) Continue Lipitor  History of stroke No acute issues. Continue aspirin, Lipitor  Breast cancer ([place]) S/p right mastectomy and radiation. --Continue home Aromasin --Follow up as scheduled with oncology  Obesity, Class III, BMI 40-49.9 (morbid obesity) (HCC) Body mass index is 37.41 kg/m. Complicates overall care and prognosis.  Recommend lifestyle modifications including physical activity and diet for weight loss and overall long-term health.         Consultants: None Procedures performed: Echo  Disposition: Home Diet recommendation:  Discharge Diet Orders (From admission, onward)     Start     Ordered   01/01/23 0000  Diet - low sodium heart healthy        01/01/23 1307           Cardiac diet DISCHARGE MEDICATION: Allergies as of 01/01/2023       Reactions   Gantrisin [sulfisoxazole] Nausea Only   Sulfa Antibiotics Nausea Only   Tape Other (See Comments)        Medication List     TAKE these medications    acetaminophen  500 MG tablet Commonly known as: TYLENOL Take 1,000 mg by mouth every 6 (six) hours as needed for moderate pain.   aspirin 81 MG chewable tablet Chew 81 mg by mouth daily.   atorvastatin 20 MG tablet Commonly known as: LIPITOR Take 20 mg by mouth at bedtime.   cetirizine 10 MG tablet Commonly known as: ZYRTEC Take 10 mg by mouth daily as needed for allergies.    Coenzyme Q10 100 MG capsule Take 100 mg by mouth daily.   cyanocobalamin 1000 MCG/ML injection Commonly known as: VITAMIN B12 Inject 1,000 mcg into the muscle every 30 (thirty) days.   exemestane 25 MG tablet Commonly known as: AROMASIN TAKE ONE TABLET BY MOUTH ONE TIME DAILY AFTER BREAKFAST   fluticasone 50 MCG/ACT nasal spray Commonly known as: FLONASE Place 2 sprays into both nostrils at bedtime as needed for rhinitis.   furosemide 20 MG tablet Commonly known as: Lasix Take 1 tablet (20 mg total) by mouth daily.   lisinopril 20 MG tablet Commonly known as: ZESTRIL Take 20 mg by mouth daily.   metoprolol tartrate 25 MG tablet Commonly known as: LOPRESSOR Take 25 mg by mouth 2 (two) times daily.   naproxen 500 MG tablet Commonly known as: NAPROSYN Take 1 tablet (500 mg total) by mouth 2 (two) times daily as needed. With food. What changed:  when to take this reasons to take this additional instructions   nystatin cream Commonly known as: MYCOSTATIN Apply 1 application topically daily as needed (Yeast).   pantoprazole 40 MG tablet Commonly known as: PROTONIX Take 40 mg by mouth daily.   vitamin D3 25 MCG (1000 UT) tablet Generic drug: Cholecalciferol Take 2,000 Units by mouth daily.        Discharge Exam: Filed Weights   12/30/22 1800 12/31/22 0651 01/01/23 0500  Weight: 114.2 kg 105.1 kg 102.7 kg   General exam: awake, alert, no acute distress HEENT: atraumatic, clear conjunctiva, anicteric sclera, moist mucus membranes, hearing grossly normal  Respiratory system: CTAB, no wheezes, rales or rhonchi, normal respiratory effort. Cardiovascular system: normal S1/S2, RRR, resolved BLE edema   Gastrointestinal system: soft, NT, ND, Central nervous system: A&O x4. no gross focal neurologic deficits, normal speech Extremities: moves all, no LE edema, normal tone Skin: dry, intact, normal temperature Psychiatry: normal mood, congruent affect, judgement and  insight appear normal   Condition at discharge: stable  The results of significant diagnostics from this hospitalization (including imaging, microbiology, ancillary and laboratory) are listed below for reference.   Imaging Studies: ECHOCARDIOGRAM COMPLETE  Result Date: 12/31/2022    ECHOCARDIOGRAM REPORT   Patient Name:   CELITA HEGGER Date of Exam: 12/31/2022 Medical Rec #:  [id number]    Height:       66.0 in Accession #:    [id number]   Weight:       231.8 lb Date of Birth:  1946-03-31    BSA:          2.129 m Patient Age:    23 years     BP:           161/80 mmHg Patient Gender: F            HR:           79 bpm. Exam Location:  [facility] Procedure: 2D Echo and Intracardiac Opacification Agent Indications:     CHF I50.31  History:         Patient has no prior history of Echocardiogram examinations.  Sonographer:     Kathlen Brunswick RDCS Referring Phys:  Unknown Foley NIU Diagnosing Phys: Isaias Cowman MD  Sonographer Comments: Technically challenging study due to limited acoustic windows, no apical window and no subcostal window. Image acquisition challenging due to patient body habitus. IMPRESSIONS  1. Left ventricular ejection fraction, by estimation, is 55 to 60%. The left ventricle has normal function. The left ventricle has no regional wall motion abnormalities. Left ventricular diastolic parameters are consistent with Grade I diastolic dysfunction (impaired relaxation).  2. Right ventricular systolic function is normal. The right ventricular size is normal.  3. The mitral valve is normal in structure. Mild mitral valve regurgitation. No evidence of mitral stenosis.  4. The aortic valve is normal in structure. Aortic valve regurgitation is not visualized. No aortic stenosis is present.  5. The inferior vena cava is normal in size with greater than 50% respiratory variability, suggesting right atrial pressure of 3 mmHg. FINDINGS  Left Ventricle: Left ventricular ejection fraction, by estimation, is 55  to 60%. The left ventricle has normal function. The left ventricle has no regional wall motion abnormalities. The left ventricular internal cavity size was normal in size. There is  no left ventricular hypertrophy. Left ventricular diastolic parameters are consistent with Grade I diastolic dysfunction (impaired relaxation). Right Ventricle: The right ventricular size is normal. No increase in right ventricular wall thickness. Right ventricular systolic function is normal. Left Atrium: Left atrial size was normal in size. Right Atrium: Right atrial size was normal in size. Pericardium: There is no evidence of pericardial effusion. Mitral Valve: The mitral valve is normal in structure. Mild mitral valve regurgitation. No evidence of mitral valve stenosis. Tricuspid Valve: The tricuspid valve is normal in structure. Tricuspid valve regurgitation is mild . No evidence of tricuspid stenosis. Aortic Valve: The aortic valve is normal in structure. Aortic valve regurgitation is not visualized. No aortic stenosis is present. Aortic valve peak gradient measures 8.9 mmHg. Pulmonic Valve: The pulmonic valve was normal in structure. Pulmonic valve regurgitation is not visualized. No evidence of pulmonic stenosis. Aorta: The aortic root is normal in size and structure. Venous: The inferior vena cava is normal in size with greater than 50% respiratory variability, suggesting right atrial pressure of 3 mmHg. IAS/Shunts: No atrial level shunt detected by color flow Doppler.  LEFT VENTRICLE PLAX 2D LVIDd:         4.30 cm   Diastology LVIDs:         3.00 cm   LV e' medial:    5.87 cm/s LV PW:         1.30 cm   LV E/e' medial:  14.2 LV IVS:        1.00 cm   LV e' lateral:   6.85 cm/s LVOT diam:     1.80 cm   LV E/e' lateral: 12.2 LVOT Area:     2.54 cm  LEFT ATRIUM         Index LA diam:    4.30 cm 2.02 cm/m  AORTIC VALVE              PULMONIC VALVE AV Vmax:      149.00 cm/s PV Vmax:        0.88 m/s AV Peak Grad: 8.9 mmHg    PV Peak  grad:   3.1 mmHg                           RVOT Peak  grad: 3 mmHg AORTA Ao Root diam: 2.90 cm Ao Asc diam:  2.80 cm MITRAL VALVE                TRICUSPID VALVE MV Area (PHT): 3.91 cm     TV Peak grad:   31.9 mmHg MV Decel Time: 194 msec     TV Vmax:        2.82 m/s MV E velocity: 83.30 cm/s MV A velocity: 100.00 cm/s  SHUNTS MV E/A ratio:  0.83         Systemic Diam: 1.80 cm Isaias Cowman MD Electronically signed by Isaias Cowman MD Signature Date/Time: 12/31/2022/12:07:00 PM    Final    CT Angio Chest PE W and/or Wo Contrast  Result Date: 12/30/2022 CLINICAL DATA:  dyspnea, back pain,. breast cancer EXAM: CT ANGIOGRAPHY CHEST WITH CONTRAST TECHNIQUE: Multidetector CT imaging of the chest was performed using the standard protocol during bolus administration of intravenous contrast. Multiplanar CT image reconstructions and MIPs were obtained to evaluate the vascular anatomy. RADIATION DOSE REDUCTION: This exam was performed according to the departmental dose-optimization program which includes automated exposure control, adjustment of the mA and/or kV according to patient size and/or use of iterative reconstruction technique. CONTRAST:  134mL OMNIPAQUE IOHEXOL 350 MG/ML SOLN COMPARISON:  None Available. FINDINGS: Cardiovascular: SVC patent. Mild cardiomegaly with left atrial enlargement. No pericardial effusion. The RV is nondilated. Satisfactory opacification of pulmonary arteries noted, and there is no evidence of pulmonary emboli. Scattered coronary calcifications. Adequate contrast opacification of the thoracic aorta with no evidence of dissection, aneurysm, or stenosis. There is classic 3-vessel brachiocephalic arch anatomy without proximal stenosis. Mild scattered aortic plaque. Mediastinum/Nodes: No mass or adenopathy. Lungs/Pleura: Bilateral moderate pleural effusions. No pneumothorax. Peripheral interstitial prominence bilaterally with geographic ground-glass opacities most marked in the  upper lobes. Some scattered airspace disease in the posterior right upper lobe. Dependent atelectasis in the lung bases. Upper Abdomen: Cholecystectomy clips.  No acute findings. Musculoskeletal: Right shoulder DJD. Vertebral endplate spurring at multiple levels in the mid thoracic spine. No fracture or worrisome bone lesion. Review of the MIP images confirms the above findings. IMPRESSION: 1. Negative for acute PE or thoracic aortic dissection. 2. Cardiomegaly with bilateral pleural effusions and interstitial and ground-glass pulmonary opacities suggesting CHF. 3. Coronary and aortic Atherosclerosis (ICD10-I70.0). Electronically Signed   By: Lucrezia Europe M.D.   On: 12/30/2022 07:58   DG Chest Portable 1 View  Result Date: 12/30/2022 CLINICAL DATA:  Shortness of breath.  Palpitations. EXAM: PORTABLE CHEST 1 VIEW COMPARISON:  None Available. FINDINGS: The cardio pericardial silhouette is enlarged. Diffuse interstitial opacity suggests edema. No focal consolidation or substantial pleural effusion. The visualized bony structures of the thorax are unremarkable. Telemetry leads overlie the chest. IMPRESSION: Enlargement of the cardiopericardial silhouette with diffuse interstitial opacity suggesting edema. Electronically Signed   By: Misty Stanley M.D.   On: 12/30/2022 06:11    Microbiology: Results for orders placed or performed during the hospital encounter of 12/30/22  Resp panel by RT-PCR (RSV, Flu A&B, Covid) Anterior Nasal Swab     Status: None   Collection Time: 12/30/22  5:39 AM   Specimen: Anterior Nasal Swab  Result Value Ref Range Status   SARS Coronavirus 2 by RT PCR NEGATIVE NEGATIVE Final    Comment: (NOTE) SARS-CoV-2 target nucleic acids are NOT DETECTED.  The SARS-CoV-2 RNA is generally detectable in upper respiratory specimens during the acute phase of infection. The lowest concentration of SARS-CoV-2 viral copies this  assay can detect is 138 copies/mL. A negative result does not  preclude SARS-Cov-2 infection and should not be used as the sole basis for treatment or other patient management decisions. A negative result may occur with  improper specimen collection/handling, submission of specimen other than nasopharyngeal swab, presence of viral mutation(s) within the areas targeted by this assay, and inadequate number of viral copies(<138 copies/mL). A negative result must be combined with clinical observations, patient history, and epidemiological information. The expected result is Negative.  Fact Sheet for Patients:  EntrepreneurPulse.com.au  Fact Sheet for Healthcare Providers:  IncredibleEmployment.be  This test is no t yet approved or cleared by the [place] FDA and  has been authorized for detection and/or diagnosis of SARS-CoV-2 by FDA under an Emergency Use Authorization (EUA). This EUA will remain  in effect (meaning this test can be used) for the duration of the COVID-19 declaration under Section 564(b)(1) of the Act, 21 U.S.C.section 360bbb-3(b)(1), unless the authorization is terminated  or revoked sooner.       Influenza A by PCR NEGATIVE NEGATIVE Final   Influenza B by PCR NEGATIVE NEGATIVE Final    Comment: (NOTE) The Xpert Xpress SARS-CoV-2/FLU/RSV plus assay is intended as an aid in the diagnosis of influenza from Nasopharyngeal swab specimens and should not be used as a sole basis for treatment. Nasal washings and aspirates are unacceptable for Xpert Xpress SARS-CoV-2/FLU/RSV testing.  Fact Sheet for Patients: EntrepreneurPulse.com.au  Fact Sheet for Healthcare Providers: IncredibleEmployment.be  This test is not yet approved or cleared by the [place] FDA and has been authorized for detection and/or diagnosis of SARS-CoV-2 by FDA under an Emergency Use Authorization (EUA). This EUA will remain in effect (meaning this test can be used) for the  duration of the COVID-19 declaration under Section 564(b)(1) of the Act, 21 U.S.C. section 360bbb-3(b)(1), unless the authorization is terminated or revoked.     Resp Syncytial Virus by PCR NEGATIVE NEGATIVE Final    Comment: (NOTE) Fact Sheet for Patients: EntrepreneurPulse.com.au  Fact Sheet for Healthcare Providers: IncredibleEmployment.be  This test is not yet approved or cleared by the [place] FDA and has been authorized for detection and/or diagnosis of SARS-CoV-2 by FDA under an Emergency Use Authorization (EUA). This EUA will remain in effect (meaning this test can be used) for the duration of the COVID-19 declaration under Section 564(b)(1) of the Act, 21 U.S.C. section 360bbb-3(b)(1), unless the authorization is terminated or revoked.  Performed at [facility], [address] [place], [place] [address]     Labs: CBC: Recent Labs  Lab 12/30/22 0539  WBC 6.4  NEUTROABS 5.1  HGB 13.4  HCT 40.7  MCV 97.6  PLT [id number]   Basic Metabolic Panel: Recent Labs  Lab 12/30/22 0539 12/31/22 0319 01/01/23 0451  NA 139 141 141  K 4.0 3.5 4.1  CL 112* 107 103  CO2 22 25 27   GLUCOSE 126* 94 87  BUN 23 20 23   CREATININE 0.67 0.70 0.83  CALCIUM 9.0 8.5* 8.9  MG 2.2 2.1  --    Liver Function Tests: Recent Labs  Lab 12/30/22 0539  AST 22  ALT 13  ALKPHOS 121  BILITOT 1.2  PROT 6.6  ALBUMIN 3.2*   CBG: No results for input(s): "GLUCAP" in the last 168 hours.  Discharge time spent: less than 30 minutes.  Signed: Ezekiel Slocumb, DO [facility] 01/01/2023

## 2023-01-07 NOTE — Progress Notes (Unsigned)
   Patient ID: Thea Gist, female    DOB: 1946-01-08, 77 y.o.   MRN: [id number]  HPI  Ms Elis is a 77 y/o female with a history of  Echo 12/31/22: EF 55-60% along with mild MR  Admitted 12/30/22 due to acute on chronic heart failure. IV diuresed.   She presents today for her initial visit with a chief complaint of  Review of Systems    Physical Exam    Assessment & Plan:  1: Chronic heart failure with preserved ejection fraction- - NYHA class  - Echo 12/31/22: EF 55-60% along with mild MR - saw cardiology Clayborn Bigness) 07/10/22 - BNP 12/30/22 was 329.9  2: HTN- - BP - saw PCP Kary Kos) 04/04/22 - BMP 01/01/23 showed sodium 141, potassium 4.1, creatinine 0.83 and GFR >60  3: Stroke- - LDL 03/28/22 was 97  4: Breast cancer- - saw surgeon Windell Moment) 11/09/22 - saw oncology Rogue Bussing) 10/24/22

## 2023-01-08 ENCOUNTER — Ambulatory Visit (HOSPITAL_BASED_OUTPATIENT_CLINIC_OR_DEPARTMENT_OTHER): Payer: Medicare Other | Admitting: Family

## 2023-01-08 ENCOUNTER — Encounter: Payer: Self-pay | Admitting: Family

## 2023-01-08 ENCOUNTER — Other Ambulatory Visit
Admission: RE | Admit: 2023-01-08 | Discharge: 2023-01-08 | Disposition: A | Discharge status: Home / Self Care | Payer: Medicare Other | Source: Ambulatory Visit | Attending: Family | Admitting: Family

## 2023-01-08 VITALS — BP 147/60 | HR 56 | Wt 225.6 lb

## 2023-01-08 DIAGNOSIS — C50811 Malignant neoplasm of overlapping sites of right female breast: Secondary | ICD-10-CM

## 2023-01-08 DIAGNOSIS — I10 Essential (primary) hypertension: Secondary | ICD-10-CM | POA: Diagnosis not present

## 2023-01-08 DIAGNOSIS — Z8673 Personal history of transient ischemic attack (TIA), and cerebral infarction without residual deficits: Secondary | ICD-10-CM | POA: Diagnosis not present

## 2023-01-08 DIAGNOSIS — I5032 Chronic diastolic (congestive) heart failure: Secondary | ICD-10-CM

## 2023-01-08 DIAGNOSIS — Z170 Estrogen receptor positive status [ER+]: Secondary | ICD-10-CM

## 2023-01-08 DIAGNOSIS — M170 Bilateral primary osteoarthritis of knee: Secondary | ICD-10-CM

## 2023-01-08 LAB — BASIC METABOLIC PANEL
Anion gap: 8 (ref 5–15)
BUN: 25 mg/dL — ABNORMAL HIGH (ref 8–23)
CO2: 27 mmol/L (ref 22–32)
Calcium: 8.9 mg/dL (ref 8.9–10.3)
Chloride: 105 mmol/L (ref 98–111)
Creatinine, Ser: 0.85 mg/dL (ref 0.44–1.00)
GFR, Estimated: >60 mL/min (ref >60)
Glucose, Bld: 103 mg/dL — ABNORMAL HIGH (ref 70–99)
Potassium: 3.8 mmol/L (ref 3.5–5.1)
Sodium: 140 mmol/L (ref 135–145)

## 2023-01-18 ENCOUNTER — Other Ambulatory Visit: Payer: Self-pay | Admitting: Internal Medicine

## 2023-02-07 ENCOUNTER — Encounter: Payer: Self-pay | Admitting: Radiation Oncology

## 2023-02-07 ENCOUNTER — Ambulatory Visit
Admission: RE | Admit: 2023-02-07 | Discharge: 2023-02-07 | Disposition: A | Discharge status: Home / Self Care | Payer: Medicare Other | Source: Ambulatory Visit | Attending: Radiation Oncology | Admitting: Radiation Oncology

## 2023-02-07 DIAGNOSIS — Z923 Personal history of irradiation: Secondary | ICD-10-CM | POA: Diagnosis not present

## 2023-02-07 DIAGNOSIS — Z79811 Long term (current) use of aromatase inhibitors: Secondary | ICD-10-CM | POA: Insufficient documentation

## 2023-02-07 DIAGNOSIS — Z170 Estrogen receptor positive status [ER+]: Secondary | ICD-10-CM | POA: Insufficient documentation

## 2023-02-07 DIAGNOSIS — D0512 Intraductal carcinoma in situ of left breast: Secondary | ICD-10-CM | POA: Diagnosis present

## 2023-02-07 NOTE — Progress Notes (Signed)
Radiation Oncology Follow up Note  Name: Kelly Howell   Date:   02/07/2023 MRN:  [id number] DOB: 1946/05/31    This 77 y.o. 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  She is status post right modified radical mastectomy.  From a breast standpoint she is doing well specifically denies breast tenderness cough or bone pain she is having problems with peripheral edema in her lower extremities is currently on Lasix although she states some of that has worsened.  She had a mammograms back in January I reviewed were BI-RADS 2 benign of her left breast.  She is currently on Aromasin tolerating it well.   COMPLICATIONS OF TREATMENT: none  FOLLOW UP COMPLIANCE: keeps appointments   PHYSICAL EXAM:  There were no vitals taken for this visit. Patient status post right modified radical mastectomy chest wall is clear left breast is free of dominant mass.  No axillary or supraclavicular adenopathy is identified.  Lungs are clear she does have peripheral edema in her lower extremities.  RADIOLOGY RESULTS: Mammogram reviewed compatible with above-stated findings  PLAN: Present time patient is doing well with no evidence of disease.  Any turn follow-up care over to medical oncology.  I have asked her to contact her personal physician about her lower extremity edema she may need some adjustment of her Lasix.  Patient is to call anytime with any concerns.  I would like to take this opportunity to thank you for allowing me to participate in the care of your patient.Carmina Miller, MD

## 2023-02-27 ENCOUNTER — Encounter: Payer: Medicare Other | Admitting: Family

## 2023-03-19 ENCOUNTER — Other Ambulatory Visit: Payer: Self-pay | Admitting: Internal Medicine

## 2023-04-24 ENCOUNTER — Inpatient Hospital Stay: Payer: Medicare Other | Admitting: Internal Medicine

## 2023-04-24 ENCOUNTER — Inpatient Hospital Stay: Payer: Medicare Other

## 2023-05-17 ENCOUNTER — Other Ambulatory Visit: Payer: Self-pay | Admitting: Nephrology

## 2023-05-17 DIAGNOSIS — I509 Heart failure, unspecified: Secondary | ICD-10-CM

## 2023-05-17 DIAGNOSIS — E785 Hyperlipidemia, unspecified: Secondary | ICD-10-CM

## 2023-05-17 DIAGNOSIS — I10 Essential (primary) hypertension: Secondary | ICD-10-CM

## 2023-05-17 DIAGNOSIS — E668 Other obesity: Secondary | ICD-10-CM

## 2023-05-17 DIAGNOSIS — Z853 Personal history of malignant neoplasm of breast: Secondary | ICD-10-CM

## 2023-05-17 DIAGNOSIS — K911 Postgastric surgery syndromes: Secondary | ICD-10-CM

## 2023-05-17 DIAGNOSIS — N1832 Chronic kidney disease, stage 3b: Secondary | ICD-10-CM

## 2023-05-18 ENCOUNTER — Other Ambulatory Visit: Payer: Self-pay | Admitting: Internal Medicine

## 2023-05-18 ENCOUNTER — Ambulatory Visit
Admission: RE | Admit: 2023-05-18 | Discharge: 2023-05-18 | Disposition: A | Discharge status: Home / Self Care | Payer: Medicare Other | Source: Ambulatory Visit | Attending: Nephrology | Admitting: Nephrology

## 2023-05-18 DIAGNOSIS — K911 Postgastric surgery syndromes: Secondary | ICD-10-CM | POA: Diagnosis present

## 2023-05-18 DIAGNOSIS — I10 Essential (primary) hypertension: Secondary | ICD-10-CM

## 2023-05-18 DIAGNOSIS — I509 Heart failure, unspecified: Secondary | ICD-10-CM | POA: Diagnosis present

## 2023-05-18 DIAGNOSIS — Z853 Personal history of malignant neoplasm of breast: Secondary | ICD-10-CM | POA: Insufficient documentation

## 2023-05-18 DIAGNOSIS — N1832 Chronic kidney disease, stage 3b: Secondary | ICD-10-CM | POA: Diagnosis present

## 2023-05-18 DIAGNOSIS — E785 Hyperlipidemia, unspecified: Secondary | ICD-10-CM

## 2023-05-18 DIAGNOSIS — E668 Other obesity: Secondary | ICD-10-CM | POA: Diagnosis present

## 2023-05-21 ENCOUNTER — Encounter: Payer: Self-pay | Admitting: Internal Medicine

## 2023-05-22 ENCOUNTER — Encounter: Payer: Self-pay | Admitting: Internal Medicine

## 2023-05-22 ENCOUNTER — Inpatient Hospital Stay: Payer: Medicare Other | Attending: Internal Medicine

## 2023-05-22 ENCOUNTER — Inpatient Hospital Stay: Payer: Medicare Other

## 2023-05-22 ENCOUNTER — Inpatient Hospital Stay (HOSPITAL_BASED_OUTPATIENT_CLINIC_OR_DEPARTMENT_OTHER): Payer: Medicare Other | Admitting: Internal Medicine

## 2023-05-22 VITALS — BP 131/64 | HR 66 | Temp 98.4°F | Ht 66.0 in | Wt 226.0 lb

## 2023-05-22 DIAGNOSIS — C50811 Malignant neoplasm of overlapping sites of right female breast: Secondary | ICD-10-CM | POA: Insufficient documentation

## 2023-05-22 DIAGNOSIS — G4730 Sleep apnea, unspecified: Secondary | ICD-10-CM | POA: Insufficient documentation

## 2023-05-22 DIAGNOSIS — R232 Flushing: Secondary | ICD-10-CM | POA: Diagnosis not present

## 2023-05-22 DIAGNOSIS — M810 Age-related osteoporosis without current pathological fracture: Secondary | ICD-10-CM

## 2023-05-22 DIAGNOSIS — Z79811 Long term (current) use of aromatase inhibitors: Secondary | ICD-10-CM | POA: Insufficient documentation

## 2023-05-22 DIAGNOSIS — Z170 Estrogen receptor positive status [ER+]: Secondary | ICD-10-CM | POA: Insufficient documentation

## 2023-05-22 DIAGNOSIS — M8580 Other specified disorders of bone density and structure, unspecified site: Secondary | ICD-10-CM | POA: Diagnosis not present

## 2023-05-22 DIAGNOSIS — I509 Heart failure, unspecified: Secondary | ICD-10-CM | POA: Insufficient documentation

## 2023-05-22 DIAGNOSIS — E559 Vitamin D deficiency, unspecified: Secondary | ICD-10-CM

## 2023-05-22 DIAGNOSIS — N184 Chronic kidney disease, stage 4 (severe): Secondary | ICD-10-CM | POA: Insufficient documentation

## 2023-05-22 LAB — COMPREHENSIVE METABOLIC PANEL
ALT: 19 U/L (ref 0–44)
AST: 18 U/L (ref 15–41)
Albumin: 3.4 g/dL — ABNORMAL LOW (ref 3.5–5.0)
Alkaline Phosphatase: 177 U/L — ABNORMAL HIGH (ref 38–126)
Anion gap: 6 (ref 5–15)
BUN: 27 mg/dL — ABNORMAL HIGH (ref 8–23)
CO2: 24 mmol/L (ref 22–32)
Calcium: 8.9 mg/dL (ref 8.9–10.3)
Chloride: 108 mmol/L (ref 98–111)
Creatinine, Ser: 1.35 mg/dL — ABNORMAL HIGH (ref 0.44–1.00)
GFR, Estimated: 41 mL/min — ABNORMAL LOW (ref >60)
Glucose, Bld: 122 mg/dL — ABNORMAL HIGH (ref 70–99)
Potassium: 4.6 mmol/L (ref 3.5–5.1)
Sodium: 138 mmol/L (ref 135–145)
Total Bilirubin: 0.9 mg/dL (ref 0.3–1.2)
Total Protein: 6.7 g/dL (ref 6.5–8.1)

## 2023-05-22 LAB — CBC WITH DIFFERENTIAL/PLATELET
Abs Immature Granulocytes: 0.02 10*3/uL (ref 0.00–0.07)
Basophils Absolute: 0 10*3/uL (ref 0.0–0.1)
Basophils Relative: 1 %
Eosinophils Absolute: 0.2 10*3/uL (ref 0.0–0.5)
Eosinophils Relative: 4 %
HCT: 34.8 % — ABNORMAL LOW (ref 36.0–46.0)
Hemoglobin: 11.4 g/dL — ABNORMAL LOW (ref 12.0–15.0)
Immature Granulocytes: 0 %
Lymphocytes Relative: 16 %
Lymphs Abs: 0.8 10*3/uL (ref 0.7–4.0)
MCH: 32.3 pg (ref 26.0–34.0)
MCHC: 32.8 g/dL (ref 30.0–36.0)
MCV: 98.6 fL (ref 80.0–100.0)
Monocytes Absolute: 0.5 10*3/uL (ref 0.1–1.0)
Monocytes Relative: 10 %
Neutro Abs: 3.4 10*3/uL (ref 1.7–7.7)
Neutrophils Relative %: 69 %
Platelets: 224 10*3/uL (ref 150–400)
RBC: 3.53 MIL/uL — ABNORMAL LOW (ref 3.87–5.11)
RDW: 12 % (ref 11.5–15.5)
WBC: 5 10*3/uL (ref 4.0–10.5)
nRBC: 0 % (ref 0.0–0.2)

## 2023-05-22 LAB — VITAMIN D 25 HYDROXY (VIT D DEFICIENCY, FRACTURES): Vit D, 25-Hydroxy: 56.06 ng/mL (ref 30–100)

## 2023-05-22 NOTE — Assessment & Plan Note (Addendum)
#    2022-RIGHT BREAST STAGE I ER/PR POSITIVE; Her2 NEGATIVE.s/p Mastectomy [Dr.Cintron]; pmT1c sentinel lymph node negative.  Grade 2.  No Oncotype.  Clinically low risk.  [2022] -Left breast DCIS-status post lumpectomy radiation.   # Continue Aromasin [poor tolerance to anastrozole]-tolerating with mild to moderate side effects see below. Mammo- JAN 2024 [Dr.Cintron]- There are benign calcifications within the lumpectomy bed. No mammographic evidence of malignancy. Stable.   # CHF Ivin Poot 2024; Dr.Callowod]- on diuretics; stable.    # CKD stage IV [Dr.Korrapati- ? Naproxen]- renal US Stable.   # Chronic Arthralgias- on Naproxen [at least 1 a day]; recommend evaluation with Ortho defer to referral.   #Osteopenia : Worse- bone density [June-- 2022- T-score of -2.0.; June 2020 [T score -1.3];  [not calcium-constipation].  however recommend holding the recast- NOT GIVEN [dental appt in feb 2024-ok; renal insuff]  continue the vit D to 2000 units; 1-2 tums a day. Stable.   # right & Left sided chest wall swelling-Lymphedema- s/p  evaluation with Maureen.  Stable.    DISPOSITION: # HOLD reclast- # follow in 6 month-MD;cbc; cmp;iron studies; ferritin;  vit D- 25 OH levels;  possible reclast-  Dr.B

## 2023-05-22 NOTE — Progress Notes (Signed)
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  MRI January 2023-shows small cluster [subcentimeter x3]   PATHOLOGIC STAGE CLASSIFICATION (pTNM, AJCC 8th Edition):  Modified Classification: Not applicable  pT Category: pT1c  T Suffix: (m) multiple primary synchronous tumors in a single organ  Regional Lymph Nodes Modifier: (sn) Sentinel nodes evaluated  pN Category: pN0  pM Category: Not applicable   Patient had severe intolerance to anastrozole [started on left DCIS].  I would recommend Aromasin.   # FEB 15th, 2023- START AROMASIN   ---------------  BREAST, LEFT; EXCISION:  - DUCTAL CARCINOMA IN SITU, INTERMEDIATE GRADE, WITH COMEDONECROSIS AND  ASSOCIATED CALCIFICATIONS.  - ASSOCIATED ATYPICAL LOBULAR HYPERPLASIA.  - TWO CLIPS AND TWO BIOPSY SITES IDENTIFIED.  - NO EVIDENCE OF INVASIVE CARCINOMA.  - SEE CANCER SUMMARY.   B. LYMPH NODE, LEFT AXILLARY SENTINEL; EXCISION:  - ONE LYMPH NODE, NEGATIVE FOR MALIGNANCY (0/1).   # AUG, 3rd 2022-anastrozole x6 weeks; STOP SEP 2022 [sec to [facility]]   # SURVIVORSHIP:   # GENETICS:   DIAGNOSIS:   STAGE:         ;  GOALS:  CURRENT/MOST  RECENT THERAPY :     Ductal carcinoma in situ (DCIS) of left breast  11/09/2020 Initial Diagnosis   Ductal carcinoma in situ (DCIS) of left breast   12/03/2020 Cancer Staging   Staging form: Breast, AJCC 8th Edition - Clinical: Stage 0 (cTis (DCIS), cN0, cM0, G2, ER+) - Signed by Earna Coder, MD on 12/03/2020 Stage prefix: Initial diagnosis   Carcinoma of overlapping sites of right breast in female, estrogen receptor positive (HCC)  10/18/2021 Initial Diagnosis   Carcinoma of overlapping sites of right breast in female, estrogen receptor positive (HCC)   11/23/2021 Cancer Staging   Staging form: Breast, AJCC 8th Edition - Pathologic: Stage IA (pT1c, pN0, cM0, G2, ER+, PR+, HER2-) - Signed by Earna Coder, MD on 11/23/2021 Histologic grading system: 3 grade system    HISTORY OF PRESENTING ILLNESS: Ambulating with wheelchair.  Alone.  Kelly Howell 77 y.o.  female right breast cancer stage I ER/PR positive; her 2 Neg postmastectomy &  left breast DCIS s/p lumpectomy currently on adjuvant Aomasin is here for follow-up.  Patient in JULY, 2024- [facility] for SOB, CHF and had an echo, chest xray and ct angiogram. Dx with sleep apnea July 04/2023.   Labs done in June 2024 by Dr. Burnett Sheng, referred to nephrology Dr. Claudie Fisherman Kidney. Stage 3B Kidney disease- from chronic naproxen use [for bil knee pain].    Pt asking if she should have infusion today due to decrease in kidney function. Complains of mild hot flashes.  Continues to be compliant with her Aromasin.  Review of Systems  Constitutional:  Negative for chills, diaphoresis, fever and  weight loss.  HENT:  Negative for nosebleeds and sore throat.   Eyes:  Negative for double vision.  Respiratory:  Negative for cough, hemoptysis, sputum production, shortness of breath and wheezing.   Cardiovascular:  Negative for chest pain, palpitations and orthopnea.  Gastrointestinal:  Negative for abdominal pain, blood in  stool, constipation, diarrhea, heartburn, melena, nausea and vomiting.  Genitourinary:  Negative for dysuria, frequency and urgency.  Musculoskeletal:  Positive for back pain and joint pain.  Skin:  Negative for itching.  Neurological:  Negative for dizziness, tingling, focal weakness, weakness and headaches.  Endo/Heme/Allergies:  Does not bruise/bleed easily.  Psychiatric/Behavioral:  Negative for depression. The patient is not nervous/anxious and does not have insomnia.      MEDICAL HISTORY:  Past Medical History:  Diagnosis Date   Abnormal mammogram    Atypical hyperplasia of breast    Left   Breast cancer (HCC) 2000   left lumpectomy   CHF (congestive heart failure) (HCC)    Colon polyps    COVID    09/2021   Dumping syndrome    Dyspnea    Dysrhythmia    pvc   Hyperlipidemia    Hypertension    IBS (irritable bowel syndrome)    Intermittent constipation    Intermittent diarrhea    Left sided lacunar infarction (HCC)    per pt 11/04/21 testing at [facility] thought to be inner ear problem   OA (osteoarthritis)    Obesity    Personal history of radiation therapy    Stroke (HCC)    Torus palatinus     SURGICAL HISTORY: Past Surgical History:  Procedure Laterality Date   BREAST BIOPSY Left 2000   Positive   BREAST BIOPSY Left 11/03/2020   affirm bx ,  posterior group coil marker, DCIS NUCLEAR GRADE 2 WITH COMEDONECROSIS AND ASSOCIATED CALCIFICATIONS  ATYPICAL LOBULAR HYPERPLASIA   BREAST BIOPSY Left 11/03/2020   affirm bx, anterior group x marker, DCIS NUCLEAR GRADE 2 WITH COMEDONECROSIS AND ASSOCIATED CALCIFICATIONS  ATYPICAL LOBULAR HYPERPLASIA   BREAST BIOPSY Right 10/04/2021   Stereo Bx, X-clip, path pending   BREAST LUMPECTOMY Left 2000   positive for breast ca   BREAST LUMPECTOMY Left 11/19/2020   NL with SN DCIS   CESAREAN SECTION     CHOLECYSTECTOMY     COLONOSCOPY     COLONOSCOPY WITH PROPOFOL N/A 07/04/2016   Procedure: COLONOSCOPY WITH PROPOFOL;  Surgeon:  Christena Deem, MD;  Location: [facility] ENDOSCOPY;  Service: Endoscopy;  Laterality: N/A;   COLONOSCOPY WITH PROPOFOL N/A 07/21/2021   Procedure: COLONOSCOPY WITH PROPOFOL;  Surgeon: Jaynie Collins, DO;  Location: [facility] ENDOSCOPY;  Service: Gastroenterology;  Laterality: N/A;   Left Wrist Biopsy     pt denies 11/04/21   PARTIAL MASTECTOMY WITH NEEDLE LOCALIZATION AND AXILLARY SENTINEL LYMPH NODE BX Left 11/19/2020   Procedure: PARTIAL MASTECTOMY WITH NEEDLE LOCALIZATION AND AXILLARY SENTINEL LYMPH NODE BX;  Surgeon: Carolan Shiver, MD;  Location: [facility] ORS;  Service: General;  Laterality: Left;   TOTAL MASTECTOMY Right 11/07/2021   Procedure: TOTAL MASTECTOMY WITH SENTINEL LYMPH NODE BIOPSY;  Surgeon: Carolan Shiver, MD;  Location: [facility] ORS;  Service: General;  Laterality: Right;    SOCIAL HISTORY: Social History   Socioeconomic History   Marital status: Married    Spouse name: Not on file   Number of children: Not on file   Years of education: Not on file   Highest education level: Not on file  Occupational History  Not on file  Tobacco Use   Smoking status: Never   Smokeless tobacco: Never  Vaping Use   Vaping status: Never Used  Substance and Sexual Activity   Alcohol use: Never   Drug use: Never   Sexual activity: Not Currently  Other Topics Concern   Not on file  Social History Narrative   Lives in [place]. 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    Social Determinants of Health   Financial Resource Strain: Patient Declined (04/10/2023)   Received from [facility], [facility]   Overall Financial Resource Strain (CARDIA)    Difficulty of Paying Living Expenses: Patient declined  Food Insecurity: Patient Declined (04/10/2023)   Received from [facility], [facility]   Hunger Vital Sign    Worried  About Running Out of Food in the Last Year: Patient declined    Ran Out of Food in the Last Year: Patient declined  Transportation Needs: Patient Declined (04/10/2023)   Received from [facility], [facility]   [facility] - Transportation    In the past 12 months, has lack of transportation kept you from medical appointments or from getting medications?: Patient declined    Lack of Transportation (Non-Medical): Patient declined  Physical Activity: Not on file  Stress: Not on file  Social Connections: Not on file  Intimate Partner Violence: Not At Risk (12/30/2022)   Humiliation, Afraid, Rape, and Kick questionnaire    Fear of Current or Ex-Partner: No    Emotionally Abused: No    Physically Abused: No    Sexually Abused: No    FAMILY HISTORY: Family History  Problem Relation Age of Onset   Colon cancer Mother    Breast cancer Maternal Aunt 60   Breast cancer Paternal Grandmother 31    ALLERGIES:  is allergic to gantrisin [sulfisoxazole], sulfa antibiotics, and tape.  MEDICATIONS:  Current Outpatient Medications  Medication Sig Dispense Refill   acetaminophen (TYLENOL) 500 MG tablet Take 1,000 mg by mouth every 6 (six) hours as needed for moderate pain.     Ascorbic Acid (VITAMIN C) 1000 MG tablet Take 1,000 mg by mouth daily.     aspirin 81 MG chewable tablet Chew 81 mg by mouth daily.     atorvastatin (LIPITOR) 20 MG tablet Take 20 mg by mouth at bedtime.     cetirizine (ZYRTEC) 10 MG tablet Take 10 mg by mouth daily as needed for allergies.     Coenzyme Q10 100 MG capsule Take 100 mg by mouth daily.     cyanocobalamin (,VITAMIN B-12,) 1000 MCG/ML injection Inject 1,000 mcg into the muscle every 30 (thirty) days.     exemestane (AROMASIN) 25 MG tablet TAKE ONE TABLET BY MOUTH ONE TIME DAILY AFTER BREAKFAST 30 tablet 0   fluticasone (FLONASE) 50 MCG/ACT nasal spray Place 2 sprays into both nostrils at bedtime as needed for rhinitis.     lisinopril  (PRINIVIL,ZESTRIL) 20 MG tablet Take 20 mg by mouth daily.     metoprolol tartrate (LOPRESSOR) 25 MG tablet Take 25 mg by mouth 2 (two) times daily.     naproxen (NAPROSYN) 500 MG tablet Take 500 mg by mouth daily.     nystatin cream (MYCOSTATIN) Apply 1 application topically daily as needed (Yeast).     pantoprazole (PROTONIX) 40 MG tablet Take 40 mg by  mouth daily.     torsemide (DEMADEX) 20 MG tablet Take 20 mg by mouth daily.     Vitamin D3 (VITAMIN D) 25 MCG tablet Take 2,000 Units by mouth daily.     No current facility-administered medications for this visit.      Right and left BREAST exam [in the presence of nurse]-right mastectomy.  Left side no unusual skin changes or dominant masses felt. Surgical scars noted.  Generalized mild swelling noted on the left breast/chest wall.  PHYSICAL EXAMINATION: ECOG PERFORMANCE STATUS: 0 - Asymptomatic  Vitals:   05/22/23 1327  BP: 131/64  Pulse: 66  Temp: 98.4 F (36.9 C)  SpO2: 100%   Filed Weights   05/22/23 1327  Weight: 226 lb (102.5 kg)    Physical Exam HENT:     Head: Normocephalic and atraumatic.     Mouth/Throat:     Pharynx: No oropharyngeal exudate.  Eyes:     Pupils: Pupils are equal, round, and reactive to light.  Cardiovascular:     Rate and Rhythm: Normal rate and regular rhythm.  Pulmonary:     Effort: Pulmonary effort is normal. No respiratory distress.     Breath sounds: Normal breath sounds. No wheezing.  Abdominal:     General: Bowel sounds are normal. There is no distension.     Palpations: Abdomen is soft. There is no mass.     Tenderness: There is no abdominal tenderness. There is no guarding or rebound.  Musculoskeletal:        General: No tenderness. Normal range of motion.     Cervical back: Normal range of motion and neck supple.  Skin:    General: Skin is warm.  Neurological:     Mental Status: She is alert and oriented to person, place, and time.  Psychiatric:        Mood and Affect:  Affect normal.      LABORATORY DATA:  I have reviewed the data as listed Lab Results  Component Value Date   WBC 5.0 05/22/2023   HGB 11.4 (L) 05/22/2023   HCT 34.8 (L) 05/22/2023   MCV 98.6 05/22/2023   PLT 224 05/22/2023   Recent Labs    10/24/22 1033 12/30/22 0539 12/31/22 0319 01/01/23 0451 01/08/23 0956 05/22/23 1329  NA 140 139   < > 141 140 138  K 4.3 4.0   < > 4.1 3.8 4.6  CL 107 112*   < > 103 105 108  CO2 26 22   < > 27 27 24   GLUCOSE 109* 126*   < > 87 103* 122*  BUN 20 23   < > 23 25* 27*  CREATININE 0.88 0.67   < > 0.83 0.85 1.35*  CALCIUM 8.9 9.0   < > 8.9 8.9 8.9  GFRNONAA >60 >60   < > >60 >60 41*  PROT 6.6 6.6  --   --   --  6.7  ALBUMIN 3.5 3.2*  --   --   --  3.4*  AST 21 22  --   --   --  18  ALT 22 13  --   --   --  19  ALKPHOS 88 121  --   --   --  177*  BILITOT 0.7 1.2  --   --   --  0.9   < > = values in this interval not displayed.    RADIOGRAPHIC STUDIES: I have personally reviewed the radiological images as listed and  agreed with the findings in the report. US RENAL  Result Date: 05/22/2023 CLINICAL DATA:  Stage 3 chronic kidney disease. EXAM: RENAL / URINARY TRACT ULTRASOUND COMPLETE COMPARISON:  None Available. FINDINGS: Right Kidney: Renal measurements: 10.7 x 5.1 x 4.7 cm = volume: 134.6. ML. Echogenicity within normal limits. No mass visualized. Mild hydronephrosis. Left Kidney: Renal measurements: 10.5 x 5 x 3.7 cm = volume: 102.9 mL. Echogenicity within normal limits. No mass or hydronephrosis visualized. Bladder: Appears normal for degree of bladder distention. Bilateral ureteral jets noted. Prevoid volume of 187.6, postvoid volume of 46. Other: None. IMPRESSION: Mild right hydronephrosis. Electronically Signed   By: Sherian Rein M.D.   On: 05/22/2023 10:56    ASSESSMENT & PLAN:   Carcinoma of overlapping sites of right breast in female, estrogen receptor positive (HCC) #  2022-RIGHT BREAST STAGE I ER/PR POSITIVE; Her2 NEGATIVE.s/p  Mastectomy [Dr.Cintron]; pmT1c sentinel lymph node negative.  Grade 2.  No Oncotype.  Clinically low risk.  [2022] -Left breast DCIS-status post lumpectomy radiation.   # Continue Aromasin [poor tolerance to anastrozole]-tolerating with mild to moderate side effects see below. Mammo- JAN 2024 [Dr.Cintron]- There are benign calcifications within the lumpectomy bed. No mammographic evidence of malignancy. Stable.   # CHF Ivin Poot 2024; Dr.Callowod]- on diuretics; stable.    # CKD stage IV [Dr.Korrapati- ? Naproxen]- renal US Stable.   # Chronic Arthralgias- on Naproxen [at least 1 a day]; recommend evaluation with Ortho defer to referral.   #Osteopenia : Worse- bone density [June-- 2022- T-score of -2.0.; June 2020 [T score -1.3];  [not calcium-constipation].  however recommend holding the recast- NOT GIVEN [dental appt in feb 2024-ok; renal insuff]  continue the vit D to 2000 units; 1-2 tums a day. Stable.   # right & Left sided chest wall swelling-Lymphedema- s/p  evaluation with Maureen.  Stable.    DISPOSITION: # HOLD reclast- # follow in 6 month-MD;cbc; cmp;iron studies; ferritin;  vit D- 25 OH levels;  possible reclast-  Dr.B    All questions were answered. The patient/family knows to call the clinic with any problems, questions or concerns.   Earna Coder, MD 05/22/2023 3:11 PM

## 2023-05-22 NOTE — Progress Notes (Signed)
12/30/22 seen at [facility] for SOB, CHF and had an echo, chest xray and ct angiogram.  Dx with sleep apnea July 04/2023.  Labs done in June 2024 by Dr. Burnett Sheng, referred to nephrology Dr. Samul Dada Kidney. Stage 3B Kidney disease.  05/18/23 [facility] renal u/s.  B/L Knee pain 7/10.  On cipro now for uti.  Trouble sleeping, takes nothing to help.  C/o when she wakes up hands feel asleep.  Pt asking if she should have infusion today due to decrease in kidney function?

## 2023-06-12 ENCOUNTER — Encounter: Payer: Self-pay | Admitting: Internal Medicine

## 2023-06-14 ENCOUNTER — Other Ambulatory Visit: Payer: Self-pay | Admitting: Internal Medicine

## 2023-06-29 ENCOUNTER — Other Ambulatory Visit
Admission: RE | Admit: 2023-06-29 | Discharge: 2023-06-29 | Disposition: A | Discharge status: Home / Self Care | Payer: Medicare Other | Attending: Urology | Admitting: Urology

## 2023-06-29 ENCOUNTER — Other Ambulatory Visit: Payer: Self-pay

## 2023-06-29 ENCOUNTER — Encounter: Payer: Self-pay | Admitting: Urology

## 2023-06-29 ENCOUNTER — Ambulatory Visit (INDEPENDENT_AMBULATORY_CARE_PROVIDER_SITE_OTHER): Payer: Self-pay | Admitting: Urology

## 2023-06-29 VITALS — BP 128/70 | HR 91 | Ht 64.0 in | Wt 223.2 lb

## 2023-06-29 DIAGNOSIS — N1339 Other hydronephrosis: Secondary | ICD-10-CM | POA: Diagnosis present

## 2023-06-29 DIAGNOSIS — N1330 Unspecified hydronephrosis: Secondary | ICD-10-CM | POA: Diagnosis not present

## 2023-06-29 LAB — URINALYSIS, COMPLETE (UACMP) WITH MICROSCOPIC
Bilirubin Urine: NEGATIVE
Glucose, UA: NEGATIVE mg/dL
Hgb urine dipstick: NEGATIVE
Nitrite: NEGATIVE
Protein, ur: NEGATIVE mg/dL
Specific Gravity, Urine: 1.015 (ref 1.005–1.030)
pH: 5.5 (ref 5.0–8.0)

## 2023-06-29 NOTE — Progress Notes (Signed)
Kelly Howell,acting as a scribe for Kelly Scotland, MD.,have documented all relevant documentation on the behalf of Kelly Scotland, MD,as directed by  Kelly Scotland, MD while in the presence of Kelly Scotland, MD.  06/29/2023 4:04 PM   Kelly Howell, Kelly Howell [id number]  Referring provider: Lorain Childes, MD [address] [place],  [place] [address]  Chief Complaint  Patient presents with   Hydronephrosis    HPI: 77 year-old female who is referred from her nephrologist for mild right hydronephrosis.   She was referred to nephrology for rising creatinine as high as 1.8 back down to 1.6. It seems to have happened over the past year or so. She has multiple risk factors for this, including daily naproxen.   She underwent a renal ultrasound for further evaluation of her worsening renal function and is known to have very subtle right extrarenal pelvis versus hydronephrosis. She does not have any overt caliectasis on my personal interpretation. Unfortunately, she does not have any previous renal ultrasound images for comparison. She reports having a UTI at this time and a urine culture grew E. Coli.   She empties her bladder adequately. Her urinalysis has been negative.   She does have a personal history of breast cancer and is under the care of Dr. Donneta Romberg.   She has no history of kidney stones, smoking, or hematuria. She was hospitalized in March for congestive heart failure but did not have imaging of the kidneys at that time.   PMH: Past Medical History:  Diagnosis Date   Abnormal mammogram    Atypical hyperplasia of breast    Left   Breast cancer (HCC) 2000   left lumpectomy   CHF (congestive heart failure) (HCC)    Colon polyps    COVID    09/2021   Dumping syndrome    Dyspnea    Dysrhythmia    pvc   Hyperlipidemia    Hypertension    IBS (irritable bowel syndrome)    Intermittent constipation    Intermittent diarrhea    Left sided lacunar  infarction (HCC)    per pt 11/04/21 testing at [facility] thought to be inner ear problem   OA (osteoarthritis)    Obesity    Personal history of radiation therapy    Stroke (HCC)    Torus palatinus     Surgical History: Past Surgical History:  Procedure Laterality Date   BREAST BIOPSY Left 2000   Positive   BREAST BIOPSY Left 11/03/2020   affirm bx ,  posterior group coil marker, DCIS NUCLEAR GRADE 2 WITH COMEDONECROSIS AND ASSOCIATED CALCIFICATIONS  ATYPICAL LOBULAR HYPERPLASIA   BREAST BIOPSY Left 11/03/2020   affirm bx, anterior group x marker, DCIS NUCLEAR GRADE 2 WITH COMEDONECROSIS AND ASSOCIATED CALCIFICATIONS  ATYPICAL LOBULAR HYPERPLASIA   BREAST BIOPSY Right 10/04/2021   Stereo Bx, X-clip, path pending   BREAST LUMPECTOMY Left 2000   positive for breast ca   BREAST LUMPECTOMY Left 11/19/2020   NL with SN DCIS   CESAREAN SECTION     CHOLECYSTECTOMY     COLONOSCOPY     COLONOSCOPY WITH PROPOFOL N/A 07/04/2016   Procedure: COLONOSCOPY WITH PROPOFOL;  Surgeon: Christena Deem, MD;  Location[facility] ENDOSCOPY;  Service: Endoscopy;  Laterality: N/A;   COLONOSCOPY WITH PROPOFOL N/A 07/21/2021   Procedure: COLONOSCOPY WITH PROPOFOL;  Surgeon: Jaynie Collins, DO;  Location: [facility] END[facility]COPY;  Service: Gastroenterology;  Laterality: N/A;   Left Wrist Biopsy  pt denies 11/04/21   PARTIAL MASTECTOMY WITH NEEDLE LOCALIZATION AND AXILLARY SENTINEL LYMPH NODE BX Left 11/19/2020   Procedure: PARTIAL MASTECTOMY WITH NEEDLE LOCALIZATION AND AXILLARY SENTINEL LYMPH NODE BX;  Surgeon: Carolan Shiver, MD;  Location: [facility] ORS;  Service: General;  Laterality: Left;   TOTAL MASTECTOMY Right 11/07/2021   Procedure: TOTAL MASTECTOMY WITH SENTINEL LYMPH NODE BIOPSY;  Surgeon: Carolan Shiver, MD;  Location: [facility] ORS;  Service: General;  Laterality: Right;    Home Medications:  Allergies as of 06/29/2023       Reactions   Gantrisin [sulfisoxazole] Nausea Only   Sulfa Antibiotics  Nausea Only   Tape Other (See Comments)        Medication List        Accurate as of June 29, 2023  4:04 PM. If you have any questions, ask your nurse or doctor.          acetaminophen 500 MG tablet Commonly known as: TYLENOL Take 1,000 mg by mouth every 6 (six) hours as needed for moderate pain.   aspirin 81 MG chewable tablet Chew 81 mg by mouth daily.   atorvastatin 10 MG tablet Commonly known as: LIPITOR Take 10 mg by mouth at bedtime.   cetirizine 10 MG tablet Commonly known as: ZYRTEC Take 10 mg by mouth daily as needed for allergies.   Coenzyme Q10 100 MG capsule Take 100 mg by mouth daily.   cyanocobalamin 1000 MCG/ML injection Commonly known as: VITAMIN B12 Inject 1,000 mcg into the muscle every 30 (thirty) days.   exemestane 25 MG tablet Commonly known as: AROMASIN TAKE ONE TABLET BY MOUTH ONE TIME DAILY AFTER BREAKFAST   fluticasone 50 MCG/ACT nasal spray Commonly known as: FLONASE Place 2 sprays into both nostrils at bedtime as needed for rhinitis.   lisinopril 20 MG tablet Commonly known as: ZESTRIL Take 20 mg by mouth daily.   metoprolol tartrate 25 MG tablet Commonly known as: LOPRESSOR Take 25 mg by mouth 2 (two) times daily.   naproxen 500 MG tablet Commonly known as: NAPROSYN Take 500 mg by mouth daily.   nystatin cream Commonly known as: MYCOSTATIN Apply 1 application topically daily as needed (Yeast).   pantoprazole 40 MG tablet Commonly known as: PROTONIX Take 40 mg by mouth daily.   torsemide 20 MG tablet Commonly known as: DEMADEX Take 20 mg by mouth daily.   vitamin C 1000 MG tablet Take 1,000 mg by mouth daily.   vitamin D3 25 MCG tablet Commonly known as: CHOLECALCIFEROL Take 2,000 Units by mouth daily.        Allergies:  Allergies  Allergen Reactions   Gantrisin [Sulfisoxazole] Nausea Only   Sulfa Antibiotics Nausea Only   Tape Other (See Comments)    Family History: Family History  Problem  Relation Age of Onset   Colon cancer Mother    Breast cancer Maternal Aunt Howell   Breast cancer Paternal Grandmother 72    Social History:  reports that she has never smoked. She has never used smokeless tobacco. She reports that she does not drink alcohol and does not use drugs.   Physical Exam: BP 128/70 (BP Location: Left Arm, Patient Position: Sitting, Cuff Size: Large)   Pulse 91   Ht 5\' 4"  (1.626 m)   Wt 223 lb 3.2 oz (101.2 kg)   BMI 38.31 kg/m   Constitutional:  Alert and oriented, No acute distress. HEENT: Scenic AT, moist mucus membranes.  Trachea midline, no masses. Neurologic: Grossly intact, no focal  deficits, moving all 4 extremities. Psychiatric: Normal mood and affect.   Pertinent Imaging:     EXAM: RENAL / URINARY TRACT ULTRASOUND COMPLETE  COMPARISON:  None Available.  FINDINGS: Right Kidney:  Renal measurements: 10.7 x 5.1 x 4.7 cm = volume: 134.6. ML. Echogenicity within normal limits. No mass visualized. Mild hydronephrosis.  Left Kidney:  Renal measurements: 10.5 x 5 x 3.7 cm = volume: 102.9 mL. Echogenicity within normal limits. No mass or hydronephrosis visualized.  Bladder:  Appears normal for degree of bladder distention. Bilateral ureteral jets noted. Prevoid volume of 187.6, postvoid volume of 46.  Other:  None.  IMPRESSION: Mild right hydronephrosis.   Electronically Signed By: Sherian Rein M.D. On: 05/22/2023 10:56  This was personally reviewed today. She does not have any overt caliectasis on my personal interpretation   Assessment & Plan:    1. Right hydronephrosis - Mild, if there is an obstruction, it is unlikely contributing to her overall declined renal function - We had a discussion about imaging and imaging modalities and would like to stay away from contrast given her CKD - We would like to proceed with non-contrast CT abdomen pelvis  (prefers to avoid contrast due to her decreased renal function; understands that  this study will give [place] some information such as rule out stone as well as other anatomic information but may not completely rule out intraluminal blockage, etc)  -. We will call her for the results and any follow-up that may or may not be necessarily related to this. She is agreeable to this plan.    Return if symptoms worsen or fail to improve.   [facility] [address] [place], [place] [address] [phone]

## 2023-07-04 ENCOUNTER — Ambulatory Visit: Payer: Self-pay | Admitting: Urology

## 2023-07-11 ENCOUNTER — Encounter: Payer: Self-pay | Admitting: Internal Medicine

## 2023-07-12 ENCOUNTER — Ambulatory Visit
Admission: RE | Admit: 2023-07-12 | Discharge: 2023-07-12 | Disposition: A | Discharge status: Home / Self Care | Payer: Medicare Other | Source: Ambulatory Visit | Attending: Urology | Admitting: Urology

## 2023-07-12 DIAGNOSIS — N1339 Other hydronephrosis: Secondary | ICD-10-CM | POA: Insufficient documentation

## 2023-07-19 ENCOUNTER — Other Ambulatory Visit: Payer: Self-pay | Admitting: Internal Medicine

## 2023-08-16 ENCOUNTER — Other Ambulatory Visit: Payer: Self-pay | Admitting: Internal Medicine

## 2023-09-20 ENCOUNTER — Other Ambulatory Visit: Payer: Self-pay | Admitting: Internal Medicine

## 2023-09-20 ENCOUNTER — Other Ambulatory Visit: Payer: Self-pay | Admitting: General Surgery

## 2023-09-20 ENCOUNTER — Encounter: Payer: Self-pay | Admitting: General Surgery

## 2023-09-20 DIAGNOSIS — Z853 Personal history of malignant neoplasm of breast: Secondary | ICD-10-CM

## 2023-09-24 ENCOUNTER — Other Ambulatory Visit: Payer: Self-pay | Admitting: General Surgery

## 2023-09-24 DIAGNOSIS — R921 Mammographic calcification found on diagnostic imaging of breast: Secondary | ICD-10-CM

## 2023-10-17 ENCOUNTER — Other Ambulatory Visit: Payer: Self-pay | Admitting: Internal Medicine

## 2023-11-07 ENCOUNTER — Ambulatory Visit
Admission: RE | Admit: 2023-11-07 | Discharge: 2023-11-07 | Disposition: A | Discharge status: Home / Self Care | Payer: Medicare Other | Source: Ambulatory Visit | Attending: General Surgery | Admitting: General Surgery

## 2023-11-07 DIAGNOSIS — R921 Mammographic calcification found on diagnostic imaging of breast: Secondary | ICD-10-CM | POA: Insufficient documentation

## 2023-11-14 ENCOUNTER — Other Ambulatory Visit: Payer: Self-pay | Admitting: Internal Medicine

## 2023-11-27 ENCOUNTER — Encounter: Payer: Self-pay | Admitting: Internal Medicine

## 2023-11-27 ENCOUNTER — Inpatient Hospital Stay (HOSPITAL_BASED_OUTPATIENT_CLINIC_OR_DEPARTMENT_OTHER): Payer: Medicare Other | Admitting: Internal Medicine

## 2023-11-27 ENCOUNTER — Inpatient Hospital Stay: Payer: Medicare Other | Attending: Internal Medicine

## 2023-11-27 ENCOUNTER — Inpatient Hospital Stay: Payer: Medicare Other

## 2023-11-27 VITALS — BP 110/70 | HR 70 | Temp 98.6°F | Resp 19 | Wt 224.8 lb

## 2023-11-27 DIAGNOSIS — Z170 Estrogen receptor positive status [ER+]: Secondary | ICD-10-CM

## 2023-11-27 DIAGNOSIS — M810 Age-related osteoporosis without current pathological fracture: Secondary | ICD-10-CM

## 2023-11-27 DIAGNOSIS — M8580 Other specified disorders of bone density and structure, unspecified site: Secondary | ICD-10-CM | POA: Diagnosis not present

## 2023-11-27 DIAGNOSIS — C50811 Malignant neoplasm of overlapping sites of right female breast: Secondary | ICD-10-CM | POA: Diagnosis not present

## 2023-11-27 DIAGNOSIS — N184 Chronic kidney disease, stage 4 (severe): Secondary | ICD-10-CM | POA: Insufficient documentation

## 2023-11-27 DIAGNOSIS — D649 Anemia, unspecified: Secondary | ICD-10-CM | POA: Insufficient documentation

## 2023-11-27 DIAGNOSIS — I130 Hypertensive heart and chronic kidney disease with heart failure and stage 1 through stage 4 chronic kidney disease, or unspecified chronic kidney disease: Secondary | ICD-10-CM | POA: Insufficient documentation

## 2023-11-27 DIAGNOSIS — Z79811 Long term (current) use of aromatase inhibitors: Secondary | ICD-10-CM | POA: Insufficient documentation

## 2023-11-27 LAB — CMP (CANCER CENTER ONLY)
ALT: 20 U/L (ref 0–44)
AST: 17 U/L (ref 15–41)
Albumin: 3.4 g/dL — ABNORMAL LOW (ref 3.5–5.0)
Alkaline Phosphatase: 152 U/L — ABNORMAL HIGH (ref 38–126)
Anion gap: 9 (ref 5–15)
BUN: 43 mg/dL — ABNORMAL HIGH (ref 8–23)
CO2: 25 mmol/L (ref 22–32)
Calcium: 8.7 mg/dL — ABNORMAL LOW (ref 8.9–10.3)
Chloride: 104 mmol/L (ref 98–111)
Creatinine: 1.58 mg/dL — ABNORMAL HIGH (ref 0.44–1.00)
GFR, Estimated: 34 mL/min — ABNORMAL LOW (ref >60)
Glucose, Bld: 139 mg/dL — ABNORMAL HIGH (ref 70–99)
Potassium: 4.1 mmol/L (ref 3.5–5.1)
Sodium: 138 mmol/L (ref 135–145)
Total Bilirubin: 0.8 mg/dL (ref 0.0–1.2)
Total Protein: 6.7 g/dL (ref 6.5–8.1)

## 2023-11-27 LAB — CBC WITH DIFFERENTIAL (CANCER CENTER ONLY)
Abs Immature Granulocytes: 0.02 10*3/uL (ref 0.00–0.07)
Basophils Absolute: 0.1 10*3/uL (ref 0.0–0.1)
Basophils Relative: 1 %
Eosinophils Absolute: 0.1 10*3/uL (ref 0.0–0.5)
Eosinophils Relative: 2 %
HCT: 34 % — ABNORMAL LOW (ref 36.0–46.0)
Hemoglobin: 11.5 g/dL — ABNORMAL LOW (ref 12.0–15.0)
Immature Granulocytes: 0 %
Lymphocytes Relative: 13 %
Lymphs Abs: 0.9 10*3/uL (ref 0.7–4.0)
MCH: 32.8 pg (ref 26.0–34.0)
MCHC: 33.8 g/dL (ref 30.0–36.0)
MCV: 96.9 fL (ref 80.0–100.0)
Monocytes Absolute: 0.6 10*3/uL (ref 0.1–1.0)
Monocytes Relative: 9 %
Neutro Abs: 4.8 10*3/uL (ref 1.7–7.7)
Neutrophils Relative %: 75 %
Platelet Count: 225 10*3/uL (ref 150–400)
RBC: 3.51 MIL/uL — ABNORMAL LOW (ref 3.87–5.11)
RDW: 11.9 % (ref 11.5–15.5)
WBC Count: 6.5 10*3/uL (ref 4.0–10.5)
nRBC: 0 % (ref 0.0–0.2)

## 2023-11-27 LAB — IRON AND TIBC
Iron: 97 ug/dL (ref 28–170)
Saturation Ratios: 34 % — ABNORMAL HIGH (ref 10.4–31.8)
TIBC: 287 ug/dL (ref 250–450)
UIBC: 190 ug/dL

## 2023-11-27 LAB — FERRITIN: Ferritin: 171 ng/mL (ref 11–307)

## 2023-11-27 LAB — VITAMIN D 25 HYDROXY (VIT D DEFICIENCY, FRACTURES): Vit D, 25-Hydroxy: 64.45 ng/mL (ref 30–100)

## 2023-11-27 MED ORDER — EXEMESTANE 25 MG PO TABS: 25.0000 mg | ORAL_TABLET | Freq: Every day | ORAL | 1 refills | Status: DC

## 2023-11-27 NOTE — Patient Instructions (Signed)
#  Recommend gentle iron [iron biglycinate; 28 mg ] 1 pill a day.  This pill is unlikely to cause stomach upset or cause constipation.

## 2023-11-27 NOTE — Progress Notes (Unsigned)
 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  MRI January 2023-shows small cluster [subcentimeter x3]   PATHOLOGIC STAGE CLASSIFICATION (pTNM, AJCC 8th Edition):  Modified Classification: Not applicable  pT Category: pT1c  T Suffix: (m) multiple primary synchronous tumors in a single organ  Regional Lymph Nodes Modifier: (sn) Sentinel nodes evaluated  pN Category: pN0  pM Category: Not applicable   Patient had severe intolerance to anastrozole [started on left DCIS].  I would recommend Aromasin.   # FEB 15th, 2023- START AROMASIN   ---------------  BREAST, LEFT; EXCISION:  - DUCTAL CARCINOMA IN SITU, INTERMEDIATE GRADE, WITH COMEDONECROSIS AND  ASSOCIATED CALCIFICATIONS.  - ASSOCIATED ATYPICAL LOBULAR HYPERPLASIA.  - TWO CLIPS AND TWO BIOPSY SITES IDENTIFIED.  - NO EVIDENCE OF INVASIVE CARCINOMA.  - SEE CANCER SUMMARY.   B. LYMPH NODE, LEFT AXILLARY SENTINEL; EXCISION:  - ONE LYMPH NODE, NEGATIVE FOR MALIGNANCY (0/1).   # AUG, 3rd 2022-anastrozole x6 weeks; STOP SEP 2022 [sec to [facility]]   # SURVIVORSHIP:   # GENETICS:   DIAGNOSIS:   STAGE:         ;  GOALS:  CURRENT/MOST  RECENT THERAPY :     Ductal carcinoma in situ (DCIS) of left breast  11/09/2020 Initial Diagnosis   Ductal carcinoma in situ (DCIS) of left breast   12/03/2020 Cancer Staging   Staging form: Breast, AJCC 8th Edition - Clinical: Stage 0 (cTis (DCIS), cN0, cM0, G2, ER+) - Signed by Earna Coder, MD on 12/03/2020 Stage prefix: Initial diagnosis   Carcinoma of overlapping sites of right breast in female, estrogen receptor positive (HCC)  10/18/2021 Initial Diagnosis   Carcinoma of overlapping sites of right breast in female, estrogen receptor positive (HCC)   11/23/2021 Cancer Staging   Staging form: Breast, AJCC 8th Edition - Pathologic: Stage IA (pT1c, pN0, cM0, G2, ER+, PR+, HER2-) - Signed by Earna Coder, MD on 11/23/2021 Histologic grading system: 3 grade system    HISTORY OF PRESENTING ILLNESS: Ambulating with wheelchair.  Alone.  Kelly Howell 78 y.o.  female right breast cancer- OSA on CPAP, CKD stage III  stage I ER/PR positive; her 2 Neg postmastectomy &  left breast DCIS s/p lumpectomy currently on adjuvant Aomasin is here for follow-up.  Does not feel dizziness; NO falls.    Pt asking if she should have infusion today due to decrease in kidney function. Complains of mild hot flashes.  Continues to be compliant with her Aromasin.  Complains of Bil LE hand numbness.   Review of Systems  Constitutional:  Negative for chills, diaphoresis, fever and weight loss.  HENT:  Negative for nosebleeds and sore throat.   Eyes:  Negative for double vision.  Respiratory:  Negative for cough, hemoptysis, sputum production, shortness of breath and  wheezing.   Cardiovascular:  Negative for chest pain, palpitations and orthopnea.  Gastrointestinal:  Negative for abdominal pain, blood in stool, constipation, diarrhea, heartburn, melena, nausea and vomiting.  Genitourinary:  Negative for dysuria, frequency and urgency.  Musculoskeletal:  Positive for back pain and joint pain.   Skin:  Negative for itching.  Neurological:  Negative for dizziness, tingling, focal weakness, weakness and headaches.  Endo/Heme/Allergies:  Does not bruise/bleed easily.  Psychiatric/Behavioral:  Negative for depression. The patient is not nervous/anxious and does not have insomnia.      MEDICAL HISTORY:  Past Medical History:  Diagnosis Date   Abnormal mammogram    Atypical hyperplasia of breast    Left   Breast cancer (HCC) 2000   left lumpectomy   CHF (congestive heart failure) (HCC)    Colon polyps    COVID    09/2021   Dumping syndrome    Dyspnea    Dysrhythmia    pvc   Hyperlipidemia    Hypertension    IBS (irritable bowel syndrome)    Intermittent constipation    Intermittent diarrhea    Left sided lacunar infarction (HCC)    per pt 11/04/21 testing at [facility] thought to be inner ear problem   OA (osteoarthritis)    Obesity    Personal history of radiation therapy    Stroke (HCC)    Torus palatinus     SURGICAL HISTORY: Past Surgical History:  Procedure Laterality Date   BREAST BIOPSY Left 2000   Positive   BREAST BIOPSY Left 11/03/2020   affirm bx ,  posterior group coil marker, DCIS NUCLEAR GRADE 2 WITH COMEDONECROSIS AND ASSOCIATED CALCIFICATIONS  ATYPICAL LOBULAR HYPERPLASIA   BREAST BIOPSY Left 11/03/2020   affirm bx, anterior group x marker, DCIS NUCLEAR GRADE 2 WITH COMEDONECROSIS AND ASSOCIATED CALCIFICATIONS  ATYPICAL LOBULAR HYPERPLASIA   BREAST BIOPSY Right 10/04/2021   Stereo Bx, X-clip, path pending   BREAST LUMPECTOMY Left 2000   positive for breast ca   BREAST LUMPECTOMY Left 11/19/2020   NL with SN DCIS   CESAREAN SECTION     CHOLECYSTECTOMY     COLONOSCOPY     COLONOSCOPY WITH PROPOFOL N/A 07/04/2016   Procedure: COLONOSCOPY WITH PROPOFOL;  Surgeon: Christena Deem, MD;  Location: [facility] ENDOSCOPY;  Service: Endoscopy;  Laterality: N/A;   COLONOSCOPY WITH PROPOFOL N/A 07/21/2021   Procedure: COLONOSCOPY WITH PROPOFOL;  Surgeon: Jaynie Collins, DO;  Location: [facility] ENDOSCOPY;  Service: Gastroenterology;  Laterality: N/A;   Left Wrist Biopsy     pt denies 11/04/21   PARTIAL MASTECTOMY WITH NEEDLE LOCALIZATION AND AXILLARY SENTINEL LYMPH NODE BX Left 11/19/2020   Procedure: PARTIAL MASTECTOMY WITH NEEDLE LOCALIZATION AND AXILLARY SENTINEL LYMPH NODE BX;  Surgeon: Carolan Shiver, MD;  Location: [facility] ORS;  Service: General;  Laterality: Left;   TOTAL MASTECTOMY Right 11/07/2021   Procedure: TOTAL MASTECTOMY WITH SENTINEL LYMPH NODE BIOPSY;  Surgeon: Carolan Shiver, MD;  Location: [facility] ORS;  Service: General;  Laterality: Right;    SOCIAL HISTORY: Social History   Socioeconomic History   Marital status: Married    Spouse name: Not on file   Number of children: Not on file   Years of education: Not on file   Highest education level: Not on file  Occupational History   Not on file  Tobacco Use   Smoking status: Never   Smokeless tobacco: Never  Vaping Use   Vaping status: Never Used  Substance and Sexual Activity  Alcohol use: Never   Drug use: Never   Sexual activity: Not Currently  Other Topics Concern   Not on file  Social History Narrative   Lives in [place]. 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    Social Drivers of Health   Financial Resource Strain: Patient Declined (04/10/2023)   Received from [facility], [facility]   Overall Financial Resource Strain (CARDIA)    Difficulty of Paying Living Expenses: Patient declined  Food Insecurity: Patient Declined (04/10/2023)   Received from [facility], [facility]   Hunger Vital Sign    Worried About Running Out of Food in the Last Year: Patient declined    Ran Out of Food in the Last Year: Patient declined  Transportation Needs: Patient Declined (04/10/2023)   Received from [facility], [facility]   [facility] - Transportation    In the past 12 months, has lack of transportation kept you from medical appointments or from getting medications?: Patient declined    Lack of Transportation (Non-Medical): Patient declined  Physical Activity: Not on file  Stress: Not on file  Social Connections: Not on file  Intimate Partner Violence: Not At Risk (12/30/2022)   Humiliation, Afraid, Rape, and Kick questionnaire    Fear of Current or Ex-Partner: No    Emotionally Abused: No    Physically Abused: No    Sexually Abused: No    FAMILY HISTORY: Family History  Problem Relation Age of Onset   Colon cancer Mother    Breast cancer Maternal Aunt 78   Breast cancer Paternal Grandmother 82    ALLERGIES:  is allergic to gantrisin [sulfisoxazole], sulfa antibiotics, and tape.  MEDICATIONS:  Current Outpatient Medications  Medication Sig Dispense Refill   acetaminophen (TYLENOL) 500 MG tablet Take 1,000 mg by mouth every 6 (six) hours as needed for moderate pain.     Ascorbic Acid (VITAMIN C) 1000 MG tablet Take 1,000 mg by mouth daily.     aspirin 81 MG chewable tablet Chew 81 mg by mouth daily.     atorvastatin (LIPITOR) 10 MG tablet Take 10 mg by mouth at bedtime.     cetirizine (ZYRTEC) 10 MG tablet Take 10 mg by mouth daily as needed for allergies.     Coenzyme Q10 100 MG capsule Take 100 mg by mouth daily.     cyanocobalamin (,VITAMIN B-12,) 1000 MCG/ML injection Inject 1,000 mcg into the muscle every 30 (thirty) days.     fluticasone (FLONASE) 50 MCG/ACT nasal spray Place 2 sprays into both nostrils at bedtime as needed for rhinitis.     lisinopril (PRINIVIL,ZESTRIL) 20 MG tablet Take 20 mg by mouth daily.     metoprolol tartrate (LOPRESSOR) 25 MG tablet Take 25 mg by mouth 2 (two) times daily.     nystatin cream (MYCOSTATIN) Apply 1 application topically daily as needed (Yeast).     pantoprazole (PROTONIX) 40 MG tablet Take 40 mg by  mouth daily.     torsemide (DEMADEX) 20 MG tablet Take 20 mg by mouth daily.     Vitamin D3 (VITAMIN D) 25 MCG tablet Take 2,000 Units by mouth daily.     exemestane (AROMASIN) 25 MG tablet Take 1 tablet (25 mg total) by mouth daily after breakfast. 90 tablet 1   naproxen (NAPROSYN) 500 MG tablet Take 500 mg  by mouth daily. (Patient not taking: Reported on 11/27/2023)     No current facility-administered medications for this visit.      Right and left BREAST exam [in the presence of nurse]-right mastectomy.  Left side no unusual skin changes or dominant masses felt. Surgical scars noted.  Generalized mild swelling noted on the left breast/chest wall.  PHYSICAL EXAMINATION: ECOG PERFORMANCE STATUS: 0 - Asymptomatic  Vitals:   11/27/23 1319 11/27/23 1357  BP: (!) 88/64 110/70  Pulse: 70   Resp: 19   Temp: 98.6 F (37 C)   SpO2: 99%    Filed Weights   11/27/23 1319  Weight: 224 lb 12.8 oz (102 kg)    Physical Exam HENT:     Head: Normocephalic and atraumatic.     Mouth/Throat:     Pharynx: No oropharyngeal exudate.  Eyes:     Pupils: Pupils are equal, round, and reactive to light.  Cardiovascular:     Rate and Rhythm: Normal rate and regular rhythm.  Pulmonary:     Effort: Pulmonary effort is normal. No respiratory distress.     Breath sounds: Normal breath sounds. No wheezing.  Abdominal:     General: Bowel sounds are normal. There is no distension.     Palpations: Abdomen is soft. There is no mass.     Tenderness: There is no abdominal tenderness. There is no guarding or rebound.  Musculoskeletal:        General: No tenderness. Normal range of motion.     Cervical back: Normal range of motion and neck supple.  Skin:    General: Skin is warm.  Neurological:     Mental Status: She is alert and oriented to person, place, and time.  Psychiatric:        Mood and Affect: Affect normal.      LABORATORY DATA:  I have reviewed the data as listed Lab Results   Component Value Date   WBC 6.5 11/27/2023   HGB 11.5 (L) 11/27/2023   HCT 34.0 (L) 11/27/2023   MCV 96.9 11/27/2023   PLT 225 11/27/2023   Recent Labs    12/30/22 0539 12/31/22 0319 01/08/23 0956 05/22/23 1329 11/27/23 1306  NA 139   < > 140 138 138  K 4.0   < > 3.8 4.6 4.1  CL 112*   < > 105 108 104  CO2 22   < > 27 24 25   GLUCOSE 126*   < > 103* 122* 139*  BUN 23   < > 25* 27* 43*  CREATININE 0.67   < > 0.85 1.35* 1.58*  CALCIUM 9.0   < > 8.9 8.9 8.7*  GFRNONAA >60   < > >60 41* 34*  PROT 6.6  --   --  6.7 6.7  ALBUMIN 3.2*  --   --  3.4* 3.4*  AST 22  --   --  18 17  ALT 13  --   --  19 20  ALKPHOS 121  --   --  177* 152*  BILITOT 1.2  --   --  0.9 0.8   < > = values in this interval not displayed.    RADIOGRAPHIC STUDIES: I have personally reviewed the radiological images as listed and agreed with the findings in the report. MM 3D DIAGNOSTIC MAMMOGRAM UNILATERAL LEFT BREAST Result Date: 11/07/2023 CLINICAL DATA:  78 year old female status post left lumpectomy for DCIS in February 2022. She is presenting for routine annual examination status post lumpectomy.  She is status post RIGHT mastectomy in January 2023. EXAM: DIGITAL DIAGNOSTIC UNILATERAL LEFT MAMMOGRAM WITH TOMOSYNTHESIS AND CAD TECHNIQUE: Left digital diagnostic mammography and breast tomosynthesis was performed. The images were evaluated with computer-aided detection. COMPARISON:  Previous exam(s). ACR Breast Density Category c: The breasts are heterogeneously dense, which may obscure small masses. FINDINGS: Spot magnification views of the left breast demonstrate dystrophic calcifications within the lumpectomy bed, with benign interval coarsening, consistent with evolving postsurgical change. There is also evolving benign fat necrosis in the left axilla. No new suspicious mass, calcification, or other findings are identified in the left breast. IMPRESSION: No evidence of malignancy in the left breast. RECOMMENDATION:  Per protocol, as the patient is now 2 or more years status post lumpectomy, she may return to annual screening mammography in 1 year. However, given the history of breast cancer, the patient remains eligible for annual diagnostic mammography if preferred. I have discussed the findings and recommendations with the patient. If applicable, a reminder letter will be sent to the patient regarding the next appointment. BI-RADS CATEGORY  2: Benign. Electronically Signed   By: Jacob Moores M.D.   On: 11/07/2023 10:23    ASSESSMENT & PLAN:   Carcinoma of overlapping sites of right breast in female, estrogen receptor positive (HCC) #  2022-RIGHT BREAST STAGE I ER/PR POSITIVE; Her2 NEGATIVE.s/p Mastectomy [Dr.Cintron]; pmT1c sentinel lymph node negative.  Grade 2.  No Oncotype.  Clinically low risk.  [2022] -Left breast DCIS-status post lumpectomy radiation.   # Continue Aromasin [poor tolerance to anastrozole]-tolerating with mild to moderate side effects see below. Mammo- JAN 2025 [Dr.Cintron]- There are benign calcifications within the lumpectomy bed. No mammographic evidence of malignancy. Stable. Refilled exemestane.   # Anemia- likley CKD- stageIII- recommend PO iron. #Recommend gentle iron [iron biglycinate; 28 mg ] 1 pill a day.  This pill is unlikely to cause stomach upset or cause constipation.   # CHF Ivin Poot 2024; Dr.Callowod]- on diuretics; stable.    # Hypotension- Systolic 80s- asymptomatic- recheck BP- 117/60s- stable; monitor closely.   # CKD stage IV [Dr.Korrapati- ? Naproxen]- renal US Stable.   # Chronic Arthralgias- on Naproxen [at least 1 a day]; recommend evaluation with Ortho defer to referral.   #Osteopenia : Worse- bone density [June-- 2022- T-score of -2.0.; June 2020 [T score -1.3];  [not calcium-constipation].  however recommend holding the recast- NOT GIVEN [dental appt in feb 2024-ok; renal insuff]  continue the vit D to 2000 units; 1-2 tums a day. Stable.   # Bil LE hand  numbness:  Carpal tunnel- ? New walker- monitor for now.    # right & Left sided chest wall swelling-Lymphedema- s/p  evaluation with Maureen.  Stable.   [facility]   DISPOSITION: # HOLD reclast- # follow in 6 month-MD;cbc; cmp;iron studies; ferritin;  vit D- 25 OH levels;  possible Prolia-   Dr.B    All questions were answered. The patient/family knows to call the clinic with any problems, questions or concerns.   Earna Coder, MD 11/27/2023 2:05 PM

## 2023-11-27 NOTE — Progress Notes (Unsigned)
 Patient has no concerns

## 2023-11-27 NOTE — Assessment & Plan Note (Addendum)
#    2022-RIGHT BREAST STAGE I ER/PR POSITIVE; Her2 NEGATIVE.s/p Mastectomy [Dr.Cintron]; pmT1c sentinel lymph node negative.  Grade 2.  No Oncotype.  Clinically low risk.  [2022] -Left breast DCIS-status post lumpectomy radiation.   # Continue Aromasin [poor tolerance to anastrozole]-tolerating with mild to moderate side effects see below. Mammo- JAN 2025 [Dr.Cintron]- There are benign calcifications within the lumpectomy bed. No mammographic evidence of malignancy. Stable. Refilled exemestane.   # Anemia- likley CKD- stageIII- recommend PO iron. #Recommend gentle iron [iron biglycinate; 28 mg ] 1 pill a day.  This pill is unlikely to cause stomach upset or cause constipation.   # CHF Ivin Poot 2024; Dr.Callowod]- on diuretics; stable.    # Hypotension- Systolic 80s- asymptomatic- recheck BP- 117/60s- stable; monitor closely.   # CKD stage IV [Dr.Korrapati- ? Naproxen]- renal US Stable.   # Chronic Arthralgias- on Naproxen [at least 1 a day]; recommend evaluation with Ortho defer to referral.   #Osteopenia : Worse- bone density [June-- 2022- T-score of -2.0.; June 2020 [T score -1.3];  [not calcium-constipation].  however recommend holding the recast- NOT GIVEN [dental appt in feb 2024-ok; renal insuff]  continue the vit D to 2000 units; 1-2 tums a day. Stable.   # Bil LE hand numbness:  Carpal tunnel- ? New walker- monitor for now.    # right & Left sided chest wall swelling-Lymphedema- s/p  evaluation with Maureen.  Stable.   [facility]   DISPOSITION: # HOLD reclast- # follow in 6 month-MD;cbc; cmp;iron studies; ferritin;  vit D- 25 OH levels;  possible Prolia-   Dr.B

## 2023-11-29 ENCOUNTER — Encounter: Payer: Self-pay | Admitting: Internal Medicine

## 2024-05-26 ENCOUNTER — Ambulatory Visit: Payer: Medicare Other

## 2024-05-26 ENCOUNTER — Ambulatory Visit: Payer: Medicare Other | Admitting: Internal Medicine

## 2024-05-26 ENCOUNTER — Other Ambulatory Visit: Payer: Medicare Other

## 2024-05-27 ENCOUNTER — Encounter: Payer: Self-pay | Admitting: Internal Medicine

## 2024-05-27 ENCOUNTER — Inpatient Hospital Stay: Payer: Medicare Other | Attending: Internal Medicine

## 2024-05-27 ENCOUNTER — Inpatient Hospital Stay (HOSPITAL_BASED_OUTPATIENT_CLINIC_OR_DEPARTMENT_OTHER): Payer: Medicare Other | Admitting: Internal Medicine

## 2024-05-27 ENCOUNTER — Inpatient Hospital Stay: Payer: Medicare Other

## 2024-05-27 VITALS — BP 117/63 | HR 74 | Temp 98.2°F | Resp 18 | Ht 64.0 in | Wt 225.0 lb

## 2024-05-27 DIAGNOSIS — D0512 Intraductal carcinoma in situ of left breast: Secondary | ICD-10-CM

## 2024-05-27 DIAGNOSIS — Z170 Estrogen receptor positive status [ER+]: Secondary | ICD-10-CM | POA: Insufficient documentation

## 2024-05-27 DIAGNOSIS — M8580 Other specified disorders of bone density and structure, unspecified site: Secondary | ICD-10-CM | POA: Insufficient documentation

## 2024-05-27 DIAGNOSIS — C50811 Malignant neoplasm of overlapping sites of right female breast: Secondary | ICD-10-CM | POA: Insufficient documentation

## 2024-05-27 DIAGNOSIS — N184 Chronic kidney disease, stage 4 (severe): Secondary | ICD-10-CM | POA: Insufficient documentation

## 2024-05-27 DIAGNOSIS — I509 Heart failure, unspecified: Secondary | ICD-10-CM | POA: Diagnosis not present

## 2024-05-27 DIAGNOSIS — Z79811 Long term (current) use of aromatase inhibitors: Secondary | ICD-10-CM | POA: Diagnosis not present

## 2024-05-27 DIAGNOSIS — M810 Age-related osteoporosis without current pathological fracture: Secondary | ICD-10-CM

## 2024-05-27 DIAGNOSIS — D649 Anemia, unspecified: Secondary | ICD-10-CM | POA: Diagnosis not present

## 2024-05-27 LAB — CBC WITH DIFFERENTIAL (CANCER CENTER ONLY)
Abs Immature Granulocytes: 0.02 K/uL (ref 0.00–0.07)
Basophils Absolute: 0 K/uL (ref 0.0–0.1)
Basophils Relative: 1 %
Eosinophils Absolute: 0.2 K/uL (ref 0.0–0.5)
Eosinophils Relative: 3 %
HCT: 34.4 % — ABNORMAL LOW (ref 36.0–46.0)
Hemoglobin: 11.3 g/dL — ABNORMAL LOW (ref 12.0–15.0)
Immature Granulocytes: 0 %
Lymphocytes Relative: 16 %
Lymphs Abs: 0.9 K/uL (ref 0.7–4.0)
MCH: 32.2 pg (ref 26.0–34.0)
MCHC: 32.8 g/dL (ref 30.0–36.0)
MCV: 98 fL (ref 80.0–100.0)
Monocytes Absolute: 0.5 K/uL (ref 0.1–1.0)
Monocytes Relative: 9 %
Neutro Abs: 4 K/uL (ref 1.7–7.7)
Neutrophils Relative %: 71 %
Platelet Count: 221 K/uL (ref 150–400)
RBC: 3.51 MIL/uL — ABNORMAL LOW (ref 3.87–5.11)
RDW: 12.3 % (ref 11.5–15.5)
WBC Count: 5.5 K/uL (ref 4.0–10.5)
nRBC: 0 % (ref 0.0–0.2)

## 2024-05-27 LAB — CMP (CANCER CENTER ONLY)
ALT: 20 U/L (ref 0–44)
AST: 19 U/L (ref 15–41)
Albumin: 3.4 g/dL — ABNORMAL LOW (ref 3.5–5.0)
Alkaline Phosphatase: 133 U/L — ABNORMAL HIGH (ref 38–126)
Anion gap: 7 (ref 5–15)
BUN: 39 mg/dL — ABNORMAL HIGH (ref 8–23)
CO2: 26 mmol/L (ref 22–32)
Calcium: 8.9 mg/dL (ref 8.9–10.3)
Chloride: 104 mmol/L (ref 98–111)
Creatinine: 1.75 mg/dL — ABNORMAL HIGH (ref 0.44–1.00)
GFR, Estimated: 30 mL/min — ABNORMAL LOW (ref >60)
Glucose, Bld: 125 mg/dL — ABNORMAL HIGH (ref 70–99)
Potassium: 4 mmol/L (ref 3.5–5.1)
Sodium: 137 mmol/L (ref 135–145)
Total Bilirubin: 0.8 mg/dL (ref 0.0–1.2)
Total Protein: 6.6 g/dL (ref 6.5–8.1)

## 2024-05-27 LAB — IRON AND TIBC
Iron: 91 ug/dL (ref 28–170)
Saturation Ratios: 32 % — ABNORMAL HIGH (ref 10.4–31.8)
TIBC: 281 ug/dL (ref 250–450)
UIBC: 190 ug/dL

## 2024-05-27 LAB — FERRITIN: Ferritin: 194 ng/mL (ref 11–307)

## 2024-05-27 LAB — VITAMIN D 25 HYDROXY (VIT D DEFICIENCY, FRACTURES): Vit D, 25-Hydroxy: 56.12 ng/mL (ref 30–100)

## 2024-05-27 MED ORDER — EXEMESTANE 25 MG PO TABS: 25.0000 mg | ORAL_TABLET | Freq: Every day | ORAL | 1 refills | Status: DC

## 2024-05-27 MED ORDER — DENOSUMAB 60 MG/ML ~~LOC~~ SOSY
60.0000 mg | PREFILLED_SYRINGE | Freq: Once | SUBCUTANEOUS | Status: AC
Start: 2024-05-27 — End: 2024-05-27
  Administered 2024-05-27: 60 mg via SUBCUTANEOUS
  Filled 2024-05-27: qty 1

## 2024-05-27 NOTE — Patient Instructions (Signed)
#   I filled the prescription to [place] [facility].  You have to go to the website-and register; and request the medication to be mailed to your home.

## 2024-05-27 NOTE — Assessment & Plan Note (Addendum)
#    2022-RIGHT BREAST STAGE I ER/PR POSITIVE; Her2 NEGATIVE.s/p Mastectomy [Dr.Cintron]; pmT1c sentinel lymph node negative.  Grade 2.  No Oncotype.  Clinically low risk.  [2022] -Left breast DCIS-status post lumpectomy radiation.   # Continue Aromasin  [poor tolerance to anastrozole ]-tolerating with mild to moderate side effects see below. Mammo- JAN 2025 [Dr.Cintron]- There are benign calcifications within the lumpectomy bed. No mammographic evidence of malignancy. Stable. Refilled exemestane .   # Anemia- likley CKD- stageIII- recommend PO iron.  SLOW  iron every other day.   # CHF Ernestina 2024; Dr.Callowod]- on diuretics; stable.    # CKD stage IV [Dr.Korrapati- ? Naproxen ]- renal US  Stable.   # Chronic Arthralgias- on Naproxen  [at least 1 a day]; recommend evaluation with Ortho defer to referral.   #Osteopenia : Worse- bone density [June-- 2022- T-score of -2.0.; June 2020 [T score -1.3];  [not calcium -constipation].  Given the stage IIIb kidney disease recommend Prolia  instead of bisphosphonate.  [dental appt in feb 2024-ok; renal insuff]  continue the vit D to 2000 units; 1-2 tums a day. Stable.   # right & Left sided chest wall swelling-Lymphedema- s/p  evaluation with Maureen.  Stable.   [facility]  DISPOSITION: # prolia  SQ today # follow in 6 month-MD;cbc; cmp;iron studies; ferritin;  vit D- 25 OH levels;  possible Prolia -   Dr.B

## 2024-05-27 NOTE — Progress Notes (Signed)
 Needs refill aromasin , pended.  Pt states she has been taking the iron every other day instead of 3 times weekly, is this ok? It messes with her stomach.  Has been f/u with [facility], no new issues.

## 2024-05-27 NOTE — Progress Notes (Signed)
 [facility] CONSULT NOTE  Patient Care Team: Valora Agent, MD as PCP - General (Family Medicine) Dannielle Arlean FALCON, RN (Inactive) as Oncology Nurse Navigator Rennie Cindy SAUNDERS, MD as Consulting Physician (Oncology) Rodolph Romano, MD as Consulting Physician (General Surgery) Lenn Aran, MD as Referring Physician (Radiation Oncology) Cindie Jesusa HERO, RN as Oncology Nurse Navigator  CHIEF COMPLAINTS/PURPOSE OF CONSULTATION: breast cancer  #  Oncology History Overview Note  # 2005- left breast lumpectomy [Dr.Craford; Dr.Chokis; II opinion [facility]- hyperplasia] Tam x5 years   # 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  MRI January 2023-shows small cluster [subcentimeter x3]   PATHOLOGIC STAGE CLASSIFICATION (pTNM, AJCC 8th Edition):  Modified Classification: Not applicable  pT Category: pT1c  T Suffix: (m) multiple primary synchronous tumors in a single organ  Regional Lymph Nodes Modifier: (sn) Sentinel nodes evaluated  pN Category: pN0  pM Category: Not applicable   Patient had severe intolerance to anastrozole  [started on left DCIS].  I would recommend Aromasin .   # FEB 15th, 2023- START AROMASIN    ---------------  BREAST, LEFT; EXCISION:  - DUCTAL CARCINOMA IN SITU, INTERMEDIATE GRADE, WITH COMEDONECROSIS AND  ASSOCIATED CALCIFICATIONS.  - ASSOCIATED ATYPICAL LOBULAR HYPERPLASIA.  - TWO CLIPS AND TWO BIOPSY SITES IDENTIFIED.  - NO EVIDENCE OF INVASIVE CARCINOMA.  - SEE CANCER SUMMARY.   B. LYMPH NODE, LEFT AXILLARY SENTINEL; EXCISION:  - ONE LYMPH NODE, NEGATIVE FOR MALIGNANCY (0/1).   # AUG, 3rd 2022-anastrozole  x6 weeks; STOP SEP 2022 [sec to [facility]]   # SURVIVORSHIP:   # GENETICS:   DIAGNOSIS:   STAGE:         ;  GOALS:  CURRENT/MOST RECENT  THERAPY :     Ductal carcinoma in situ (DCIS) of left breast  11/09/2020 Initial Diagnosis   Ductal carcinoma in situ (DCIS) of left breast   12/03/2020 Cancer Staging   Staging form: Breast, AJCC 8th Edition - Clinical: Stage 0 (cTis (DCIS), cN0, cM0, G2, ER+) - Signed by Rennie Cindy SAUNDERS, MD on 12/03/2020 Stage prefix: Initial diagnosis   Carcinoma of overlapping sites of right breast in female, estrogen receptor positive (HCC)  10/18/2021 Initial Diagnosis   Carcinoma of overlapping sites of right breast in female, estrogen receptor positive (HCC)   11/23/2021 Cancer Staging   Staging form: Breast, AJCC 8th Edition - Pathologic: Stage IA (pT1c, pN0, cM0, G2, ER+, PR+, HER2-) - Signed by Rennie Cindy SAUNDERS, MD on 11/23/2021 Histologic grading system: 3 grade system    HISTORY OF PRESENTING ILLNESS: Ambulating with wheelchair.  Alone.  Arlean JAYSON Sar 78 y.o.  female right breast cancer- OSA on CPAP, CKD stage III  stage I ER/PR positive; her 2 Neg postmastectomy &  left breast DCIS s/p lumpectomy currently on adjuvant Aomasin is here for follow-up.  Pt states she has been taking the iron every other day instead of 3 times weekly. Also follow up with nephrology.    Complains of mild hot flashes.  Continues to be compliant with her Aromasin . Complains of Bil LE hand numbness.   Review of Systems  Constitutional:  Negative for chills, diaphoresis, fever and weight loss.  HENT:  Negative for nosebleeds and sore throat.   Eyes:  Negative for double vision.  Respiratory:  Negative for cough, hemoptysis, sputum production, shortness of breath and wheezing.  Cardiovascular:  Negative for chest pain, palpitations and orthopnea.  Gastrointestinal:  Negative for abdominal pain, blood in stool, constipation, diarrhea, heartburn, melena, nausea and vomiting.  Genitourinary:  Negative for dysuria, frequency and urgency.  Musculoskeletal:  Positive for back pain and joint pain.  Skin:   Negative for itching.  Neurological:  Negative for dizziness, tingling, focal weakness, weakness and headaches.  Endo/Heme/Allergies:  Does not bruise/bleed easily.  Psychiatric/Behavioral:  Negative for depression. The patient is not nervous/anxious and does not have insomnia.      MEDICAL HISTORY:  Past Medical History:  Diagnosis Date   Abnormal mammogram    Atypical hyperplasia of breast    Left   Breast cancer (HCC) 2000   left lumpectomy   CHF (congestive heart failure) (HCC)    Colon polyps    COVID    09/2021   Dumping syndrome    Dyspnea    Dysrhythmia    pvc   Hyperlipidemia    Hypertension    IBS (irritable bowel syndrome)    Intermittent constipation    Intermittent diarrhea    Left sided lacunar infarction (HCC)    per pt 11/04/21 testing [facility] thought to be inner ear problem   OA (osteoarthritis)    Obesity    Personal history of radiation therapy    Stroke (HCC)    Torus palatinus     SURGICAL HISTORY: Past Surgical History:  Procedure Laterality Date   BREAST BIOPSY Left 2000   Positive   BREAST BIOPSY Left 11/03/2020   affirm bx ,  posterior group coil marker, DCIS NUCLEAR GRADE 2 WITH COMEDONECROSIS AND ASSOCIATED CALCIFICATIONS  ATYPICAL LOBULAR HYPERPLASIA   BREAST BIOPSY Left 11/03/2020   affirm bx, anterior group x marker, DCIS NUCLEAR GRADE 2 WITH COMEDONECROSIS AND ASSOCIATED CALCIFICATIONS  ATYPICAL LOBULAR HYPERPLASIA   BREAST BIOPSY Right 10/04/2021   Stereo Bx, X-clip, path pending   BREAST LUMPECTOMY Left 2000   positive for breast ca   BREAST LUMPECTOMY Left 11/19/2020   NL with SN DCIS   CESAREAN SECTION     CHOLECYSTECTOMY     COLONOSCOPY     COLONOSCOPY WITH PROPOFOL  N/A 07/04/2016   Procedure: COLONOSCOPY WITH PROPOFOL ;  Surgeon: Gladis RAYMOND Mariner, MD;  Location: [facility] ENDOSCOPY;  Service: Endoscopy;  Laterality: N/A;   COLONOSCOPY WITH PROPOFOL  N/A 07/21/2021   Procedure: COLONOSCOPY WITH PROPOFOL ;  Surgeon: Onita Elspeth Sharper, DO;  Location: [facility] ENDOSCOPY;  Service: Gastroenterology;  Laterality: N/A;   Left Wrist Biopsy     pt denies 11/04/21   PARTIAL MASTECTOMY WITH NEEDLE LOCALIZATION AND AXILLARY SENTINEL LYMPH NODE BX Left 11/19/2020   Procedure: PARTIAL MASTECTOMY WITH NEEDLE LOCALIZATION AND AXILLARY SENTINEL LYMPH NODE BX;  Surgeon: Rodolph Romano, MD;  Location: [facility] ORS;  Service: General;  Laterality: Left;   TOTAL MASTECTOMY Right 11/07/2021   Procedure: TOTAL MASTECTOMY WITH SENTINEL LYMPH NODE BIOPSY;  Surgeon: Rodolph Romano, MD;  Location: [facility] ORS;  Service: General;  Laterality: Right;    SOCIAL HISTORY: Social History   Socioeconomic History   Marital status: Married    Spouse name: Not on file   Number of children: Not on file   Years of education: Not on file   Highest education level: Not on file  Occupational History   Not on file  Tobacco Use   Smoking status: Never   Smokeless tobacco: Never  Vaping Use   Vaping status: Never Used  Substance and Sexual Activity   Alcohol use: Never  Drug use: Never   Sexual activity: Not Currently  Other Topics Concern   Not on file  Social History Narrative   Lives in [place]. 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    Social Drivers of Health   Financial Resource Strain: Patient Declined (04/10/2024)   Received from [facility]   Overall Financial Resource Strain (CARDIA)    Difficulty of Paying Living Expenses: Patient declined  Food Insecurity: Patient Declined (04/10/2024)   Received from [facility]   Hunger Vital Sign    Within the past 12 months, you worried that your food would run out before you got the money to buy more.: Patient declined    Within the past 12 months, the food you bought just didn't last and you didn't have money to get more.: Patient declined  Transportation Needs:  Patient Declined (04/10/2024)   Received from [facility] - Transportation    In the past 12 months, has lack of transportation kept you from medical appointments or from getting medications?: Patient declined    Lack of Transportation (Non-Medical): Patient declined  Physical Activity: Not on file  Stress: Not on file  Social Connections: Not on file  Intimate Partner Violence: Not At Risk (12/30/2022)   Humiliation, Afraid, Rape, and Kick questionnaire    Fear of Current or Ex-Partner: No    Emotionally Abused: No    Physically Abused: No    Sexually Abused: No    FAMILY HISTORY: Family History  Problem Relation Age of Onset   Colon cancer Mother    Breast cancer Maternal Aunt 61   Breast cancer Paternal Grandmother 28    ALLERGIES:  is allergic to gantrisin [sulfisoxazole], sulfa antibiotics, and tape.  MEDICATIONS:  Current Outpatient Medications  Medication Sig Dispense Refill   acetaminophen  (TYLENOL ) 500 MG tablet Take 1,000 mg by mouth every 6 (six) hours as needed for moderate pain.     Ascorbic Acid (VITAMIN C) 1000 MG tablet Take 1,000 mg by mouth daily.     aspirin  81 MG chewable tablet Chew 81 mg by mouth daily.     atorvastatin  (LIPITOR) 10 MG tablet Take 10 mg by mouth at bedtime.     cetirizine (ZYRTEC) 10 MG tablet Take 10 mg by mouth daily as needed for allergies.     Coenzyme Q10 100 MG capsule Take 100 mg by mouth daily.     cyanocobalamin  (,VITAMIN B-12,) 1000 MCG/ML injection Inject 1,000 mcg into the muscle every 30 (thirty) days.     fluticasone  (FLONASE ) 50 MCG/ACT nasal spray Place 2 sprays into both nostrils at bedtime as needed for rhinitis.     lisinopril  (PRINIVIL ,ZESTRIL ) 20 MG tablet Take 20 mg by mouth daily.     metoprolol  tartrate (LOPRESSOR ) 25 MG tablet Take 25 mg by mouth 2 (two) times daily.     nystatin  cream (MYCOSTATIN ) Apply 1 application topically daily as needed (Yeast).     pantoprazole  (PROTONIX ) 40 MG  tablet Take 40 mg by mouth daily.     torsemide (DEMADEX) 20 MG tablet Take 20 mg by mouth daily.     Vitamin D3 (VITAMIN D ) 25 MCG tablet Take 2,000 Units by mouth daily.     exemestane  (AROMASIN ) 25 MG tablet Take 1 tablet (25 mg total) by mouth daily after breakfast. 90 tablet 1   Fe Bisgly-Vit  C-Vit B12-FA 28-60-0.008-0.4 MG CAPS Take 1 tablet by mouth every other day.     No current facility-administered medications for this visit.    PHYSICAL EXAMINATION: ECOG PERFORMANCE STATUS: 0 - Asymptomatic  Vitals:   05/27/24 1332  BP: 117/63  Pulse: 74  Resp: 18  Temp: 98.2 F (36.8 C)  SpO2: 100%   Filed Weights   05/27/24 1332  Weight: 225 lb (102.1 kg)    Physical Exam HENT:     Head: Normocephalic and atraumatic.     Mouth/Throat:     Pharynx: No oropharyngeal exudate.  Eyes:     Pupils: Pupils are equal, round, and reactive to light.  Cardiovascular:     Rate and Rhythm: Normal rate and regular rhythm.  Pulmonary:     Effort: Pulmonary effort is normal. No respiratory distress.     Breath sounds: Normal breath sounds. No wheezing.  Abdominal:     General: Bowel sounds are normal. There is no distension.     Palpations: Abdomen is soft. There is no mass.     Tenderness: There is no abdominal tenderness. There is no guarding or rebound.  Musculoskeletal:        General: No tenderness. Normal range of motion.     Cervical back: Normal range of motion and neck supple.  Skin:    General: Skin is warm.  Neurological:     Mental Status: She is alert and oriented to person, place, and time.  Psychiatric:        Mood and Affect: Affect normal.      LABORATORY DATA:  I have reviewed the data as listed Lab Results  Component Value Date   WBC 5.5 05/27/2024   HGB 11.3 (L) 05/27/2024   HCT 34.4 (L) 05/27/2024   MCV 98.0 05/27/2024   PLT 221 05/27/2024   Recent Labs    11/27/23 1306 05/27/24 1323  NA 138 137  K 4.1 4.0  CL 104 104  CO2 25 26  GLUCOSE 139*  125*  BUN 43* 39*  CREATININE 1.58* 1.75*  CALCIUM  8.7* 8.9  GFRNONAA 34* 30*  PROT 6.7 6.6  ALBUMIN 3.4* 3.4*  AST 17 19  ALT 20 20  ALKPHOS 152* 133*  BILITOT 0.8 0.8    RADIOGRAPHIC STUDIES: I have personally reviewed the radiological images as listed and agreed with the findings in the report. No results found.   ASSESSMENT & PLAN:   Carcinoma of overlapping sites of right breast in female, estrogen receptor positive (HCC) #  2022-RIGHT BREAST STAGE I ER/PR POSITIVE; Her2 NEGATIVE.s/p Mastectomy [Dr.Cintron]; pmT1c sentinel lymph node negative.  Grade 2.  No Oncotype.  Clinically low risk.  [2022] -Left breast DCIS-status post lumpectomy radiation.   # Continue Aromasin  [poor tolerance to anastrozole ]-tolerating with mild to moderate side effects see below. Mammo- JAN 2025 [Dr.Cintron]- There are benign calcifications within the lumpectomy bed. No mammographic evidence of malignancy. Stable. Refilled exemestane .   # Anemia- likley CKD- stageIII- recommend PO iron.  SLOW  iron every other day.   # CHF Ernestina 2024; Dr.Callowod]- on diuretics; stable.    # CKD stage IV [Dr.Korrapati- ? Naproxen ]- renal US  Stable.   # Chronic Arthralgias- on Naproxen  [at least 1 a day]; recommend evaluation with Ortho defer to referral.   #Osteopenia : Worse- bone density [June-- 2022- T-score of -2.0.; June 2020 [T score -1.3];  [not calcium -constipation].  Given the stage IIIb kidney disease recommend Prolia  instead of bisphosphonate.  [dental appt in feb  2024-ok; renal insuff]  continue the vit D to 2000 units; 1-2 tums a day. Stable.   # right & Left sided chest wall swelling-Lymphedema- s/p  evaluation with Maureen.  Stable.   [facility]  DISPOSITION: # prolia  SQ today # follow in 6 month-MD;cbc; cmp;iron studies; ferritin;  vit D- 25 OH levels;  possible Prolia -   Dr.B     All questions were answered. The patient/family knows to call the clinic with any problems, questions or  concerns.   Cindy JONELLE Joe, MD 05/27/2024 2:42 PM

## 2024-06-11 ENCOUNTER — Other Ambulatory Visit: Payer: Self-pay | Admitting: Internal Medicine

## 2024-06-11 DIAGNOSIS — Z170 Estrogen receptor positive status [ER+]: Secondary | ICD-10-CM

## 2024-06-12 ENCOUNTER — Encounter: Payer: Self-pay | Admitting: Internal Medicine

## 2024-06-16 ENCOUNTER — Telehealth: Payer: Self-pay | Admitting: *Deleted

## 2024-06-16 DIAGNOSIS — Z170 Estrogen receptor positive status [ER+]: Secondary | ICD-10-CM

## 2024-06-16 MED ORDER — EXEMESTANE 25 MG PO TABS: 25.0000 mg | ORAL_TABLET | Freq: Every day | ORAL | 1 refills | Status: AC

## 2024-06-16 NOTE — Telephone Encounter (Signed)
 The patient called and said that she needs a refill of her exemestane .  He had Dr. For Monday to send it to the [place] cost that is a pharmacy that sends it in the mail.  He states that she is not a good person on the computer and so she never got to use that because she could not get it set up in her computer.  She wants her refills of the exemestane  25 mg to go back to [facility].

## 2024-09-01 ENCOUNTER — Other Ambulatory Visit: Payer: Self-pay | Admitting: General Surgery

## 2024-09-01 DIAGNOSIS — Z1231 Encounter for screening mammogram for malignant neoplasm of breast: Secondary | ICD-10-CM

## 2024-09-01 DIAGNOSIS — Z853 Personal history of malignant neoplasm of breast: Secondary | ICD-10-CM

## 2024-11-14 ENCOUNTER — Encounter

## 2024-11-17 ENCOUNTER — Inpatient Hospital Stay: Admission: RE | Admit: 2024-11-17 | Source: Ambulatory Visit

## 2024-11-26 ENCOUNTER — Inpatient Hospital Stay

## 2024-11-26 ENCOUNTER — Inpatient Hospital Stay: Admitting: Internal Medicine

## 2024-11-28 ENCOUNTER — Other Ambulatory Visit

## 2024-11-28 ENCOUNTER — Ambulatory Visit: Admitting: Internal Medicine

## 2024-11-28 ENCOUNTER — Ambulatory Visit
